# Patient Record
Sex: Female | Born: 1950 | Race: White | Hispanic: No | Marital: Married | Smoking: Former smoker
Health system: Southern US, Community
[De-identification: ages and names within clinical notes are randomized; demographics above are authoritative.]

## PROBLEM LIST (undated history)

## (undated) DIAGNOSIS — F419 Anxiety disorder, unspecified: Secondary | ICD-10-CM

## (undated) DIAGNOSIS — K219 Gastro-esophageal reflux disease without esophagitis: Secondary | ICD-10-CM

## (undated) DIAGNOSIS — F32A Depression, unspecified: Secondary | ICD-10-CM

## (undated) DIAGNOSIS — I10 Essential (primary) hypertension: Secondary | ICD-10-CM

## (undated) DIAGNOSIS — K760 Fatty (change of) liver, not elsewhere classified: Secondary | ICD-10-CM

## (undated) DIAGNOSIS — T7840XA Allergy, unspecified, initial encounter: Secondary | ICD-10-CM

## (undated) DIAGNOSIS — G709 Myoneural disorder, unspecified: Secondary | ICD-10-CM

## (undated) DIAGNOSIS — Z5189 Encounter for other specified aftercare: Secondary | ICD-10-CM

## (undated) DIAGNOSIS — R519 Headache, unspecified: Secondary | ICD-10-CM

## (undated) DIAGNOSIS — M1990 Unspecified osteoarthritis, unspecified site: Secondary | ICD-10-CM

## (undated) DIAGNOSIS — E785 Hyperlipidemia, unspecified: Secondary | ICD-10-CM

## (undated) HISTORY — PX: INCONTINENCE SURGERY: SHX676

## (undated) HISTORY — DX: Allergy, unspecified, initial encounter: T78.40XA

## (undated) HISTORY — PX: LAPAROSCOPIC LYSIS OF ADHESIONS: SHX5905

## (undated) HISTORY — PX: DIAGNOSTIC LAPAROSCOPY: SUR761

## (undated) HISTORY — DX: Encounter for other specified aftercare: Z51.89

## (undated) HISTORY — PX: SPINE SURGERY: SHX786

## (undated) HISTORY — PX: BACK SURGERY: SHX140

## (undated) HISTORY — PX: CHOLECYSTECTOMY: SHX55

## (undated) HISTORY — PX: CARPAL TUNNEL RELEASE: SHX101

## (undated) HISTORY — PX: JOINT REPLACEMENT: SHX530

---

## 1971-04-15 DIAGNOSIS — K759 Inflammatory liver disease, unspecified: Secondary | ICD-10-CM

## 1971-04-15 HISTORY — DX: Inflammatory liver disease, unspecified: K75.9

## 2005-06-25 ENCOUNTER — Ambulatory Visit: Payer: Self-pay | Admitting: Gastroenterology

## 2005-06-26 ENCOUNTER — Ambulatory Visit: Payer: Self-pay | Admitting: Gastroenterology

## 2011-05-25 ENCOUNTER — Encounter (HOSPITAL_COMMUNITY): Payer: Self-pay | Admitting: *Deleted

## 2011-05-25 ENCOUNTER — Emergency Department (HOSPITAL_COMMUNITY): Payer: 59

## 2011-05-25 ENCOUNTER — Emergency Department (HOSPITAL_COMMUNITY)
Admission: EM | Admit: 2011-05-25 | Discharge: 2011-05-25 | Disposition: A | Discharge status: Home / Self Care | Payer: 59 | Attending: Emergency Medicine | Admitting: Emergency Medicine

## 2011-05-25 DIAGNOSIS — S42253A Displaced fracture of greater tuberosity of unspecified humerus, initial encounter for closed fracture: Secondary | ICD-10-CM | POA: Insufficient documentation

## 2011-05-25 DIAGNOSIS — M79609 Pain in unspecified limb: Secondary | ICD-10-CM | POA: Insufficient documentation

## 2011-05-25 DIAGNOSIS — S42209A Unspecified fracture of upper end of unspecified humerus, initial encounter for closed fracture: Secondary | ICD-10-CM

## 2011-05-25 DIAGNOSIS — M25519 Pain in unspecified shoulder: Secondary | ICD-10-CM | POA: Insufficient documentation

## 2011-05-25 DIAGNOSIS — K219 Gastro-esophageal reflux disease without esophagitis: Secondary | ICD-10-CM | POA: Insufficient documentation

## 2011-05-25 DIAGNOSIS — W010XXA Fall on same level from slipping, tripping and stumbling without subsequent striking against object, initial encounter: Secondary | ICD-10-CM | POA: Insufficient documentation

## 2011-05-25 DIAGNOSIS — Z79899 Other long term (current) drug therapy: Secondary | ICD-10-CM | POA: Insufficient documentation

## 2011-05-25 DIAGNOSIS — S42213A Unspecified displaced fracture of surgical neck of unspecified humerus, initial encounter for closed fracture: Secondary | ICD-10-CM | POA: Insufficient documentation

## 2011-05-25 DIAGNOSIS — I10 Essential (primary) hypertension: Secondary | ICD-10-CM | POA: Insufficient documentation

## 2011-05-25 DIAGNOSIS — E785 Hyperlipidemia, unspecified: Secondary | ICD-10-CM | POA: Insufficient documentation

## 2011-05-25 HISTORY — DX: Hyperlipidemia, unspecified: E78.5

## 2011-05-25 HISTORY — DX: Essential (primary) hypertension: I10

## 2011-05-25 HISTORY — DX: Gastro-esophageal reflux disease without esophagitis: K21.9

## 2011-05-25 MED ORDER — HYDROMORPHONE HCL PF 1 MG/ML IJ SOLN
1.0000 mg | Freq: Once | INTRAMUSCULAR | Status: AC
  Administered 2011-05-25: 1 mg via INTRAVENOUS
  Filled 2011-05-25: qty 1

## 2011-05-25 MED ORDER — OXYCODONE-ACETAMINOPHEN 5-325 MG PO TABS: 1.0000 | ORAL_TABLET | Freq: Four times a day (QID) | ORAL | Status: AC | PRN

## 2011-05-25 NOTE — Progress Notes (Signed)
Orthopedic Tech Progress Note Patient Details:  Michele Hanson 03/03/1951 [id number]  Other Ortho Devices Ortho Device Location: immobililzer sling Ortho Device Interventions: Application   Cammer, Mickie Bail 05/25/2011, 2:40 PM

## 2011-05-25 NOTE — ED Notes (Signed)
Patient tripped over dog today, fell hitting corner of wall, patient c/o upper right arm pain, +PMS in right upper extremity

## 2011-05-25 NOTE — ED Provider Notes (Signed)
History     CSN: [id number]  Arrival date & time 05/25/11  1308   First MD Initiated Contact with Patient 05/25/11 1309      Chief Complaint  Patient presents with  . Fall  . Arm Pain    right upper arm pain, fell against corner of wall    (Consider location/radiation/quality/duration/timing/severity/associated sxs/prior treatment) Patient is a 61 y.o. female presenting with fall and arm pain. The history is provided by the patient.  Fall Pertinent negatives include no numbness, no abdominal pain, no nausea, no vomiting and no headaches.  Arm Pain Pertinent negatives include no chest pain, no abdominal pain, no headaches and no shortness of breath.   patient states she tripped over her dog and fell and hit her right shoulder on the corner of the wall. She's severe pain in the right upper shoulder. She states she also hit her head but does not have a headache. No neck pain. No loss of consciousness. No numbness or weakness. The pain does limit the movement of her right arm.  Past Medical History  Diagnosis Date  . Hyperlipemia   . Hypertension   . GERD (gastroesophageal reflux disease)     Past Surgical History  Procedure Date  . Cholecystectomy     No family history on file.  History  Substance Use Topics  . Smoking status: Never Smoker   . Smokeless tobacco: Not on file  . Alcohol Use: Yes     none today    OB History    Grav Para Term Preterm Abortions TAB SAB Ect Mult Living                  Review of Systems  Constitutional: Negative for activity change and appetite change.  HENT: Negative for neck stiffness.   Eyes: Negative for pain.  Respiratory: Negative for chest tightness and shortness of breath.   Cardiovascular: Negative for chest pain and leg swelling.  Gastrointestinal: Negative for nausea, vomiting, abdominal pain and diarrhea.  Genitourinary: Negative for flank pain.  Musculoskeletal: Negative for back pain.       Right shoulder pain    Skin: Negative for rash.  Neurological: Negative for weakness, numbness and headaches.  Psychiatric/Behavioral: Negative for behavioral problems.    Allergies  Review of patient's allergies indicates no known allergies.  Home Medications   Current Outpatient Rx  Name Route Sig Dispense Refill  . AMLODIPINE BESYLATE 5 MG PO TABS Oral Take 5 mg by mouth daily.    Marland Kitchen CITALOPRAM HYDROBROMIDE 20 MG PO TABS Oral Take 20 mg by mouth daily.    Marland Kitchen LISINOPRIL-HYDROCHLOROTHIAZIDE 20-12.5 MG PO TABS Oral Take 1 tablet by mouth daily.    Marland Kitchen OMEPRAZOLE 20 MG PO CPDR Oral Take 20 mg by mouth daily.    Marland Kitchen SIMVASTATIN 20 MG PO TABS Oral Take 20 mg by mouth every evening.    . OXYCODONE-ACETAMINOPHEN 5-325 MG PO TABS Oral Take 1-2 tablets by mouth every 6 (six) hours as needed for pain. 20 tablet 0    BP 129/76  Pulse 95  Temp(Src) 98.4 F (36.9 C) (Oral)  Resp 18  SpO2 99%  Physical Exam  Nursing note and vitals reviewed. Constitutional: She is oriented to person, place, and time. She appears well-developed and well-nourished.  HENT:  Head: Normocephalic and atraumatic.  Eyes: EOM are normal. Pupils are equal, round, and reactive to light.  Neck: Normal range of motion. Neck supple.  Cardiovascular: Normal rate, regular rhythm and normal  heart sounds.   No murmur heard. Pulmonary/Chest: Effort normal and breath sounds normal. No respiratory distress. She has no wheezes. She has no rales.  Abdominal: Soft. Bowel sounds are normal. She exhibits no distension. There is no tenderness. There is no rebound and no guarding.  Musculoskeletal:       Cervical range of motion intact. No hematoma the head. Right shoulder has fullness in the deltoid area. Severe tenderness. Neurovascularly intact distally. Good radial pulse. Sensation intact over radial median and ulnar distribution. Skin is intact  Neurological: She is alert and oriented to person, place, and time. No cranial nerve deficit.  Skin: Skin is  warm and dry.  Psychiatric: She has a normal mood and affect. Her speech is normal.    ED Course  Procedures (including critical care time)  Labs Reviewed - No data to display Dg Shoulder Right  05/25/2011  *RADIOLOGY REPORT*  Clinical Data: Pain post fall  RIGHT SHOULDER - 2+ VIEW  Comparison: None.  Findings: Three views of the right shoulder submitted.  There is mild displaced fracture right proximal humerus involving surgical neck and greater tuberosity.  IMPRESSION: Mild displaced fracture proximal right humerus.  Original Report Authenticated By: Natasha Mead, M.D.     1. Proximal humeral fracture       MDM  Proximal humerus fracture. Closed. Patient wants to followup either with [facility], or with an orthopedic surgeon down in [place]. She was put in a shoulder immobilizer she is given pain medicines and was given a copy of her x-rays. She'll followup as needed        Juliet Rude. Rubin Payor, MD 05/25/11 1459

## 2012-03-16 ENCOUNTER — Telehealth: Payer: Self-pay | Admitting: Internal Medicine

## 2012-03-16 NOTE — Telephone Encounter (Signed)
S/W PT IN REF TO NP APPT. ON 03/23/12 @1 :30 REFERRING DR LEWIT DX-MONOCLONAL PROTEIN ELEVATION MAILED NP PACKET

## 2012-03-16 NOTE — Telephone Encounter (Signed)
C/D 03/16/12 for appt.03/23/12

## 2012-03-23 ENCOUNTER — Encounter: Payer: Self-pay | Admitting: Internal Medicine

## 2012-03-23 ENCOUNTER — Ambulatory Visit (HOSPITAL_BASED_OUTPATIENT_CLINIC_OR_DEPARTMENT_OTHER): Payer: 59 | Admitting: Internal Medicine

## 2012-03-23 ENCOUNTER — Ambulatory Visit: Payer: 59

## 2012-03-23 ENCOUNTER — Other Ambulatory Visit (HOSPITAL_BASED_OUTPATIENT_CLINIC_OR_DEPARTMENT_OTHER): Payer: 59 | Admitting: Lab

## 2012-03-23 ENCOUNTER — Telehealth: Payer: Self-pay | Admitting: Internal Medicine

## 2012-03-23 VITALS — BP 114/78 | HR 100 | Temp 97.8°F | Resp 20 | Ht 66.0 in | Wt 161.7 lb

## 2012-03-23 DIAGNOSIS — D472 Monoclonal gammopathy: Secondary | ICD-10-CM

## 2012-03-23 DIAGNOSIS — R52 Pain, unspecified: Secondary | ICD-10-CM

## 2012-03-23 LAB — COMPREHENSIVE METABOLIC PANEL (CC13)
ALT: 21 U/L (ref 0–55)
AST: 20 U/L (ref 5–34)
Alkaline Phosphatase: 94 U/L (ref 40–150)
Glucose: 113 mg/dl — ABNORMAL HIGH (ref 70–99)
Sodium: 140 mEq/L (ref 136–145)
Total Bilirubin: 0.59 mg/dL (ref 0.20–1.20)
Total Protein: 7 g/dL (ref 6.4–8.3)

## 2012-03-23 LAB — CBC WITH DIFFERENTIAL/PLATELET
BASO%: 0.3 % (ref 0.0–2.0)
EOS%: 1.4 % (ref 0.0–7.0)
LYMPH%: 27.3 % (ref 14.0–49.7)
MCH: 31.8 pg (ref 25.1–34.0)
MCHC: 34 g/dL (ref 31.5–36.0)
MCV: 93.5 fL (ref 79.5–101.0)
MONO%: 6.8 % (ref 0.0–14.0)
Platelets: 181 10*3/uL (ref 145–400)
RBC: 4.37 10*6/uL (ref 3.70–5.45)
RDW: 12.3 % (ref 11.2–14.5)

## 2012-03-23 NOTE — Telephone Encounter (Signed)
appts made and printed for pt aom °

## 2012-03-23 NOTE — Progress Notes (Signed)
Checked in new pt with no financial concerns. °

## 2012-03-23 NOTE — Patient Instructions (Signed)
You have very mild M spike on the previous blood, questionable for MGUS or reactive. I ordered myeloma panel today. I would see her back for followup visit in 2 weeks for evaluation and discussion of the pending lab results.

## 2012-03-23 NOTE — Progress Notes (Signed)
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  HPI Michele [facility]on is a 61 y.o. 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 The patient was seen recently by Dr. Clarisse [facility] complaining of generalized pain especially in the lower back and lower extremity as well as tingling in her feet. He ordered several blood work including serum protein electrophoreses which which was performed on 02/01/2012 and showed elevated M spike of 0.2 g/dL. Quantitative immunoglobulin showed low IgG of 639, normal IgA of 176 and normal IgM of 114. The immunofixation showed the IgG monoclonal protein with lambda light chain specificity. The patient was referred to me today for evaluation and recommendation regarding these abnormalities. She continues to complain of tingling in her feet as well as aching pain all over her body especially in the lower back. The patient denied having any significant weight loss or night sweats. She has no palpable lymphadenopathy, no bleeding, bruises or ecchymosis. She has no significant chest pain, shortness breath, cough or hemoptysis. Family history significant for a mother who had ovarian cancer and a father with prostate cancer. The patient is married and has no children. She works as a Conservator, museum/gallery for [employer]. She has no history of smoking but drinks alcohol occasionally and no history of drug abuse. @SFHPI @  Past Medical History  Diagnosis Date  . Hyperlipemia   . Hypertension   . GERD (gastroesophageal reflux disease)     Past Surgical History  Procedure Date  . Cholecystectomy     No family history on file.  Social History History  Substance Use Topics  . Smoking status: Never Smoker   . Smokeless tobacco: Not on file  . Alcohol Use:  Yes     Comment: none today    No Known Allergies  Current Outpatient Prescriptions  Medication Sig Dispense Refill  . amLODipine (NORVASC) 5 MG tablet Take 5 mg by mouth daily.      . citalopram (CELEXA) 20 MG tablet Take 20 mg by mouth daily.      . fluticasone (FLONASE) 50 MCG/ACT nasal spray Place 1 spray into the nose Ad lib.      Marland Kitchen lisinopril-hydrochlorothiazide (PRINZIDE,ZESTORETIC) 20-12.5 MG per tablet Take 1 tablet by mouth daily.      Marland Kitchen omeprazole (PRILOSEC) 20 MG capsule Take 20 mg by mouth daily.      . baclofen (LIORESAL) 10 MG tablet Take 10 mg by mouth Daily.      . pravastatin (PRAVACHOL) 40 MG tablet Take 40 mg by mouth Daily.      . Vitamin D, Ergocalciferol, (DRISDOL) 50000 UNITS CAPS Take 1 capsule by mouth Daily.        Review of Systems  A comprehensive review of systems was negative except for: Constitutional: positive for Generalized aching pain Musculoskeletal: positive for arthralgias  Physical Exam  AVW:UJWJX, healthy, no distress, well nourished and well developed SKIN: skin color, texture, turgor are normal, no rashes or significant lesions HEAD: Normocephalic, No masses, lesions, tenderness or abnormalities EYES: normal, PERRLA EARS: External ears normal OROPHARYNX:no exudate and no erythema  NECK: supple, no adenopathy LYMPH:  no palpable lymphadenopathy, no hepatosplenomegaly BREAST:not examined LUNGS: clear to auscultation  HEART: regular rate & rhythm and no murmurs ABDOMEN:abdomen soft, non-tender, normal bowel sounds and no masses or organomegaly BACK: Back symmetric, no  curvature. EXTREMITIES:no joint deformities, effusion, or inflammation, no edema, no skin discoloration, no clubbing  NEURO: alert & oriented x 3 with fluent speech, no focal motor/sensory deficits  PERFORMANCE STATUS: ECOG 0  LABORATORY DATA: Lab Results  Component Value Date   WBC 7.6 03/23/2012   HGB 13.9 03/23/2012   HCT 40.8 03/23/2012   MCV 93.5 03/23/2012    PLT 181 03/23/2012      Chemistry   No results found for this basename: NA, K, CL, CO2, BUN, CREATININE, GLU   No results found for this basename: CALCIUM, ALKPHOS, AST, ALT, BILITOT       RADIOGRAPHIC STUDIES: No results found.  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  PLAN: I have a lengthy discussion with the patient today about her condition. I ordered several studies today including myeloma panel. I recommend for the patient to continue on observation for now. I would see her back for followup visit in 2 weeks for evaluation and discussion of her myeloma panel. In the myeloma panel is suspicious for multiple myeloma I would consider the patient for a skeletal bone survey as well as a bone marrow biopsy and aspirate. The patient agreed to the current plan. She was advised to call immediately if she has any concerning symptoms in the interval.  All questions were answered. The patient knows to call the clinic with any problems, questions or concerns. We can certainly see the patient much sooner if necessary.  Thank you so much for allowing me to participate in the care of Michele Hanson. I will continue to follow up the patient with you and assist in her care.  I spent 25 minutes counseling the patient face to face. The total time spent in the appointment was 50 minutes.   Michele Hanson K. 03/23/2012, 2:37 PM

## 2012-03-24 LAB — KAPPA/LAMBDA LIGHT CHAINS: Kappa:Lambda Ratio: 0.44 (ref 0.26–1.65)

## 2012-03-24 LAB — BETA 2 MICROGLOBULIN, SERUM: Beta-2 Microglobulin: 1.88 mg/L — ABNORMAL HIGH (ref 1.01–1.73)

## 2012-03-24 LAB — IGG, IGA, IGM
IgA: 198 mg/dL (ref 69–380)
IgG (Immunoglobin G), Serum: 698 mg/dL (ref 690–1700)

## 2012-04-06 ENCOUNTER — Encounter: Payer: Self-pay | Admitting: Internal Medicine

## 2012-04-06 ENCOUNTER — Ambulatory Visit (HOSPITAL_BASED_OUTPATIENT_CLINIC_OR_DEPARTMENT_OTHER): Payer: 59 | Admitting: Internal Medicine

## 2012-04-06 VITALS — BP 116/81 | HR 83 | Temp 97.2°F | Resp 18 | Ht 66.0 in | Wt 161.9 lb

## 2012-04-06 DIAGNOSIS — D472 Monoclonal gammopathy: Secondary | ICD-10-CM

## 2012-04-06 NOTE — Patient Instructions (Signed)
No significant abnormality on the recent blood work. Followup with your primary care physician as previously scheduled.

## 2012-04-06 NOTE — Progress Notes (Signed)
[facility] [phone]   Fax:([phone]  OFFICE PROGRESS NOTE  Desmond Dike, MD [address] 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 female returns to the clinic today for followup visit. The patient is feeling fine today with no specific complaints. She was seen recently for evaluation of questionable monoclonal gammopathy. I ordered several studies on this patient including repeat CBC, comprehensive metabolic panel, LDH and myeloma panel. She is here today for evaluation and discussion of her lab results. Her CBC and comprehensive metabolic panel as well as LDH were unremarkable. The myeloma panel showed slightly elevated beta-2 microglobulin of 1.88. Free kappa light chain was normal at 0.74, free lambda light chain was normal at 1.70, and kappa/lambda ratio was normal at 0.44. Quantitative immunoglobulin showed normal IgG of 698, normal IgA of 198 and normal IgM of 104.  MEDICAL HISTORY: Past Medical History  Diagnosis Date  . Hyperlipemia   . Hypertension   . GERD (gastroesophageal reflux disease)     ALLERGIES:   has no known allergies.  MEDICATIONS:  Current Outpatient Prescriptions  Medication Sig Dispense Refill  . amLODipine (NORVASC) 5 MG tablet Take 5 mg by mouth daily.      . baclofen (LIORESAL) 10 MG tablet Take 10 mg by mouth Daily.      . citalopram (CELEXA) 20 MG tablet Take 20 mg by mouth daily.      . fluticasone (FLONASE) 50 MCG/ACT nasal spray Place 1 spray into the nose Ad lib.      Marland Kitchen lisinopril-hydrochlorothiazide (PRINZIDE,ZESTORETIC) 20-12.5 MG per tablet Take 1 tablet by mouth daily.      Marland Kitchen omeprazole (PRILOSEC) 20 MG capsule Take 20 mg by mouth daily.      . Vitamin D, Ergocalciferol, (DRISDOL) 50000 UNITS CAPS Take 1 capsule by mouth Daily.      . pravastatin (PRAVACHOL) 40 MG tablet Take  40 mg by mouth Daily.        SURGICAL HISTORY:  Past Surgical History  Procedure Date  . Cholecystectomy     REVIEW OF SYSTEMS:  A comprehensive review of systems was negative.   PHYSICAL EXAMINATION: General appearance: alert, cooperative and no distress Head: Normocephalic, without obvious abnormality, atraumatic Neck: no adenopathy Resp: clear to auscultation bilaterally Cardio: regular rate and rhythm, S1, S2 normal, no murmur, click, rub or gallop GI: soft, non-tender; bowel sounds normal; no masses,  no organomegaly Extremities: extremities normal, atraumatic, no cyanosis or edema  ECOG PERFORMANCE STATUS: 0 - Asymptomatic  Blood pressure 116/81, pulse 83, temperature 97.2 F (36.2 C), temperature source Oral, resp. rate 18, height 5\' 6"  (1.676 m), weight 161 lb 14.4 oz (73.437 kg).  LABORATORY DATA: Lab Results  Component Value Date   WBC 7.6 03/23/2012   HGB 13.9 03/23/2012   HCT 40.8 03/23/2012   MCV 93.5 03/23/2012   PLT 181 03/23/2012      Chemistry      Component Value Date/Time   NA 140 03/23/2012 1341   K 3.9 03/23/2012 1341   CL 103 03/23/2012 1341   CO2 24 03/23/2012 1341   BUN 14.0 03/23/2012 1341   CREATININE 0.9 03/23/2012 1341      Component Value Date/Time   CALCIUM 9.3 03/23/2012 1341   ALKPHOS 94 03/23/2012 1341   AST 20 03/23/2012 1341   ALT 21 03/23/2012 1341   BILITOT 0.59 03/23/2012  1341       RADIOGRAPHIC STUDIES: No results found.  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 The patient has no significant finding on the recent blood work to suggest monoclonal gammopathy or multiple myeloma.  PLAN: I discussed the lab result with the patient and give her a copy of her report. I recommended for her to continue on observation for now with routine followup visit with her primary care physician. I don't see a need to do any further evaluation at  this point but will be happy to see her in the future if needed especially if she has any significant anemia, thrombocytopenia, hypercalcemia or renal insufficiency. The patient agreed to the current plan.   All questions were answered. The patient knows to call the clinic with any problems, questions or concerns. We can certainly see the patient much sooner if necessary.

## 2013-07-20 ENCOUNTER — Telehealth: Payer: Self-pay | Admitting: Internal Medicine

## 2013-07-20 NOTE — Telephone Encounter (Signed)
returned pt call adn advised on appt...pt ok and aware

## 2013-08-05 ENCOUNTER — Other Ambulatory Visit: Payer: Self-pay | Admitting: *Deleted

## 2013-08-08 ENCOUNTER — Telehealth: Payer: Self-pay | Admitting: Internal Medicine

## 2013-08-08 ENCOUNTER — Encounter: Payer: Self-pay | Admitting: Internal Medicine

## 2013-08-08 ENCOUNTER — Ambulatory Visit (HOSPITAL_BASED_OUTPATIENT_CLINIC_OR_DEPARTMENT_OTHER): Payer: 59 | Admitting: Internal Medicine

## 2013-08-08 VITALS — BP 114/77 | HR 93 | Temp 98.3°F | Resp 18 | Ht 66.0 in | Wt 161.5 lb

## 2013-08-08 DIAGNOSIS — D472 Monoclonal gammopathy: Secondary | ICD-10-CM

## 2013-08-08 DIAGNOSIS — R52 Pain, unspecified: Secondary | ICD-10-CM

## 2013-08-08 NOTE — Telephone Encounter (Signed)
gv adn printed apt sched and avs for pt for April 2015 and 2016...lab closed today pt will comeback tomorrow

## 2013-08-08 NOTE — Progress Notes (Signed)
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 female returns to the clinic today for followup visit. The patient is feeling fine today with no specific complaints. She was seen recently for evaluation of questionable monoclonal gammopathy. She was last seen in December of 2013. She was supposed to have an annual followup visit with repeat myeloma panel but the patient missed her appointment. She was seen recently by her primary care physician and was referred back to me for evaluation of her condition. She has generalized aching pain but denied having any significant fatigue or weakness. She has no significant weight loss or night sweats. The patient denied having any chest pain, shortness breath, cough or hemoptysis. No nausea or vomiting.  MEDICAL HISTORY: Past Medical History  Diagnosis Date  . Hyperlipemia   . Hypertension   . GERD (gastroesophageal reflux disease)     ALLERGIES:  has No Known Allergies.  MEDICATIONS:  Current Outpatient Prescriptions  Medication Sig Dispense Refill  . amLODipine (NORVASC) 5 MG tablet Take 5 mg by mouth daily.      . baclofen (LIORESAL) 10 MG tablet Take 10 mg by mouth Daily.      . citalopram (CELEXA) 20 MG tablet Take 20 mg by mouth daily.      . fluticasone (FLONASE) 50 MCG/ACT nasal spray Place 1 spray into the nose Ad lib.      Marland Kitchen lisinopril-hydrochlorothiazide (PRINZIDE,ZESTORETIC) 20-12.5 MG per tablet Take 1 tablet by mouth daily.      Marland Kitchen omeprazole (PRILOSEC) 20 MG capsule Take 20 mg by mouth daily.      . pravastatin (PRAVACHOL) 40 MG tablet Take 40 mg by mouth Daily.      . Vitamin D, Ergocalciferol, (DRISDOL) 50000 UNITS CAPS Take 1 capsule by mouth Daily.       No current  facility-administered medications for this visit.    SURGICAL HISTORY:  Past Surgical History  Procedure Laterality Date  . Cholecystectomy      REVIEW OF SYSTEMS:  A comprehensive review of systems was negative except for: Musculoskeletal: positive for arthralgias   PHYSICAL EXAMINATION: General appearance: alert, cooperative and no distress Head: Normocephalic, without obvious abnormality, atraumatic Neck: no adenopathy Resp: clear to auscultation bilaterally Cardio: regular rate and rhythm, S1, S2 normal, no murmur, click, rub or gallop GI: soft, non-tender; bowel sounds normal; no masses,  no organomegaly Extremities: extremities normal, atraumatic, no cyanosis or edema  ECOG PERFORMANCE STATUS: 0 - Asymptomatic  Blood pressure 114/77, pulse 93, temperature 98.3 F (36.8 C), temperature source Oral, resp. rate 18, height 5\' 6"  (1.676 m), weight 161 lb 8 oz (73.256 kg), SpO2 97.00%.  LABORATORY DATA: Lab Results  Component Value Date   WBC 7.6 03/23/2012   HGB 13.9 03/23/2012   HCT 40.8 03/23/2012   MCV 93.5 03/23/2012   PLT 181 03/23/2012      Chemistry      Component Value Date/Time   NA 140 03/23/2012 1341   K 3.9 03/23/2012 1341   CL 103 03/23/2012 1341   CO2 24 03/23/2012 1341   BUN 14.0 03/23/2012 1341   CREATININE 0.9 03/23/2012 1341      Component Value Date/Time   CALCIUM 9.3 03/23/2012 1341   ALKPHOS 94 03/23/2012 1341   AST 20  03/23/2012 1341   ALT 21 03/23/2012 1341   BILITOT 0.59 03/23/2012 1341       RADIOGRAPHIC STUDIES: No results found.  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  The patient has been observation for the last 16 months. She does not have any lab work since December of 2013. I recommended for her to have a myeloma panel performed today. If there is no evidence for disease progression, I would see the patient back for followup  visit in one year with repeat myeloma panel. She was advised to call immediately if she has any concerning symptoms in the interval please  All questions were answered. The patient knows to call the clinic with any problems, questions or concerns. We can certainly see the patient much sooner if necessary.  Disclaimer: This note was dictated with voice recognition software. Similar sounding words can inadvertently be transcribed and may not be corrected upon review.

## 2013-08-09 ENCOUNTER — Other Ambulatory Visit (HOSPITAL_BASED_OUTPATIENT_CLINIC_OR_DEPARTMENT_OTHER): Payer: 59

## 2013-08-09 DIAGNOSIS — D472 Monoclonal gammopathy: Secondary | ICD-10-CM

## 2013-08-09 LAB — COMPREHENSIVE METABOLIC PANEL (CC13)
ALBUMIN: 4.2 g/dL (ref 3.5–5.0)
ALT: 12 U/L (ref 0–55)
ANION GAP: 11 meq/L (ref 3–11)
AST: 18 U/L (ref 5–34)
Alkaline Phosphatase: 78 U/L (ref 40–150)
BUN: 17.5 mg/dL (ref 7.0–26.0)
CALCIUM: 9.4 mg/dL (ref 8.4–10.4)
CHLORIDE: 104 meq/L (ref 98–109)
CO2: 22 mEq/L (ref 22–29)
CREATININE: 0.8 mg/dL (ref 0.6–1.1)
GLUCOSE: 82 mg/dL (ref 70–140)
POTASSIUM: 4.4 meq/L (ref 3.5–5.1)
Sodium: 137 mEq/L (ref 136–145)
Total Bilirubin: 0.4 mg/dL (ref 0.20–1.20)
Total Protein: 7.3 g/dL (ref 6.4–8.3)

## 2013-08-09 LAB — CBC WITH DIFFERENTIAL/PLATELET
BASO%: 0.3 % (ref 0.0–2.0)
BASOS ABS: 0 10*3/uL (ref 0.0–0.1)
EOS ABS: 0.1 10*3/uL (ref 0.0–0.5)
EOS%: 1.4 % (ref 0.0–7.0)
HEMATOCRIT: 40 % (ref 34.8–46.6)
HEMOGLOBIN: 13.2 g/dL (ref 11.6–15.9)
LYMPH#: 2.1 10*3/uL (ref 0.9–3.3)
LYMPH%: 28 % (ref 14.0–49.7)
MCH: 30.5 pg (ref 25.1–34.0)
MCHC: 33 g/dL (ref 31.5–36.0)
MCV: 92.4 fL (ref 79.5–101.0)
MONO#: 0.5 10*3/uL (ref 0.1–0.9)
MONO%: 6.3 % (ref 0.0–14.0)
NEUT%: 64 % (ref 38.4–76.8)
NEUTROS ABS: 4.8 10*3/uL (ref 1.5–6.5)
Platelets: 188 10*3/uL (ref 145–400)
RBC: 4.33 10*6/uL (ref 3.70–5.45)
RDW: 12.2 % (ref 11.2–14.5)
WBC: 7.4 10*3/uL (ref 3.9–10.3)

## 2013-08-09 LAB — LACTATE DEHYDROGENASE (CC13): LDH: 131 U/L (ref 125–245)

## 2013-08-11 LAB — KAPPA/LAMBDA LIGHT CHAINS
KAPPA FREE LGHT CHN: 0.22 mg/dL — AB (ref 0.33–1.94)
Kappa:Lambda Ratio: 0.1 — ABNORMAL LOW (ref 0.26–1.65)
Lambda Free Lght Chn: 2.2 mg/dL (ref 0.57–2.63)

## 2013-08-11 LAB — IGG, IGA, IGM
IGG (IMMUNOGLOBIN G), SERUM: 315 mg/dL — AB (ref 690–1700)
IgA: 96 mg/dL (ref 69–380)
IgM, Serum: 55 mg/dL (ref 52–322)

## 2013-08-11 LAB — BETA 2 MICROGLOBULIN, SERUM: BETA 2 MICROGLOBULIN: 2.07 mg/L (ref <=2.51)

## 2013-12-09 ENCOUNTER — Telehealth: Payer: Self-pay | Admitting: Medical Oncology

## 2013-12-09 NOTE — Telephone Encounter (Addendum)
Pt notified. Message copied by Ardeen Garland on Fri Dec 09, 2013 12:17 PM ------      Message from: Curt Bears      Created: Thu Dec 08, 2013  5:05 PM       Lab was good      ----- Message -----         From: Ardeen Garland, RN         Sent: 12/08/2013  12:38 PM           To: Curt Bears, MD            wants results from protein studies done in march . Never got a call back       ------

## 2014-08-10 ENCOUNTER — Other Ambulatory Visit: Payer: 59

## 2014-08-10 ENCOUNTER — Ambulatory Visit: Payer: 59 | Admitting: Internal Medicine

## 2015-05-24 ENCOUNTER — Encounter: Payer: Self-pay | Admitting: Gastroenterology

## 2016-04-21 ENCOUNTER — Other Ambulatory Visit: Payer: Self-pay | Admitting: Internal Medicine

## 2016-04-21 ENCOUNTER — Telehealth: Payer: Self-pay | Admitting: Medical Oncology

## 2016-04-21 DIAGNOSIS — D472 Monoclonal gammopathy: Secondary | ICD-10-CM

## 2016-04-21 NOTE — Telephone Encounter (Signed)
Done

## 2016-04-21 NOTE — Telephone Encounter (Signed)
requsts referral to [facility]. Last visit 2015.

## 2016-06-13 DIAGNOSIS — D472 Monoclonal gammopathy: Secondary | ICD-10-CM | POA: Diagnosis not present

## 2016-06-27 DIAGNOSIS — D472 Monoclonal gammopathy: Secondary | ICD-10-CM | POA: Diagnosis not present

## 2017-06-29 DIAGNOSIS — D472 Monoclonal gammopathy: Secondary | ICD-10-CM | POA: Diagnosis not present

## 2017-07-15 ENCOUNTER — Ambulatory Visit (INDEPENDENT_AMBULATORY_CARE_PROVIDER_SITE_OTHER): Payer: Medicare Other | Admitting: Family Medicine

## 2017-07-15 ENCOUNTER — Encounter: Payer: Self-pay | Admitting: Family Medicine

## 2017-07-15 VITALS — BP 110/80 | HR 73 | Ht 66.0 in | Wt 162.4 lb

## 2017-07-15 DIAGNOSIS — E7800 Pure hypercholesterolemia, unspecified: Secondary | ICD-10-CM | POA: Insufficient documentation

## 2017-07-15 DIAGNOSIS — E538 Deficiency of other specified B group vitamins: Secondary | ICD-10-CM

## 2017-07-15 DIAGNOSIS — I10 Essential (primary) hypertension: Secondary | ICD-10-CM

## 2017-07-15 DIAGNOSIS — M48062 Spinal stenosis, lumbar region with neurogenic claudication: Secondary | ICD-10-CM | POA: Diagnosis not present

## 2017-07-15 DIAGNOSIS — R7989 Other specified abnormal findings of blood chemistry: Secondary | ICD-10-CM

## 2017-07-15 DIAGNOSIS — Z0000 Encounter for general adult medical examination without abnormal findings: Secondary | ICD-10-CM | POA: Diagnosis not present

## 2017-07-15 DIAGNOSIS — G629 Polyneuropathy, unspecified: Secondary | ICD-10-CM | POA: Diagnosis not present

## 2017-07-15 LAB — LIPID PANEL
CHOL/HDL RATIO: 2
CHOLESTEROL: 210 mg/dL — AB (ref 0–200)
HDL: 89.9 mg/dL (ref >39.00)
LDL Cholesterol: 107 mg/dL — ABNORMAL HIGH (ref 0–99)
NonHDL: 119.97
TRIGLYCERIDES: 67 mg/dL (ref 0.0–149.0)
VLDL: 13.4 mg/dL (ref 0.0–40.0)

## 2017-07-15 LAB — COMPREHENSIVE METABOLIC PANEL
ALBUMIN: 4.2 g/dL (ref 3.5–5.2)
ALK PHOS: 51 U/L (ref 39–117)
ALT: 13 U/L (ref 0–35)
AST: 16 U/L (ref 0–37)
BILIRUBIN TOTAL: 0.6 mg/dL (ref 0.2–1.2)
BUN: 21 mg/dL (ref 6–23)
CALCIUM: 9.5 mg/dL (ref 8.4–10.5)
CO2: 29 meq/L (ref 19–32)
CREATININE: 0.72 mg/dL (ref 0.40–1.20)
Chloride: 100 mEq/L (ref 96–112)
GFR: 86.02 mL/min (ref >60.00)
Glucose, Bld: 95 mg/dL (ref 70–99)
Potassium: 4.6 mEq/L (ref 3.5–5.1)
Sodium: 139 mEq/L (ref 135–145)
TOTAL PROTEIN: 6.7 g/dL (ref 6.0–8.3)

## 2017-07-15 LAB — B12 AND FOLATE PANEL: VITAMIN B 12: 205 pg/mL — AB (ref 211–911)

## 2017-07-15 LAB — TSH: TSH: 1.37 u[IU]/mL (ref 0.35–4.50)

## 2017-07-15 MED ORDER — METHOCARBAMOL 500 MG PO TABS: 500.0000 mg | ORAL_TABLET | Freq: Three times a day (TID) | ORAL | 0 refills | Status: DC | PRN

## 2017-07-15 NOTE — Progress Notes (Addendum)
Subjective:  Patient ID: Michele Hanson, female    DOB: [birth year]  Age: 67 y.o. MRN: [id number]  CC: Establish Care   HPI Michele Hanson presents for establishment of care and follow-up of multiple medical issues.  Her blood pressures have been well controlled and run on the low side and she is wondering if she would be able to stop 1 of her medicines.  She tells me that her cholesterol has been well controlled as well.  She has been taking her pravastatin perhaps once a week.  She has never had a heart attack or stroke and has no vascular disease that she knows of.  She is wondering if she could do without the cholesterol medicine.  A review of her lipid panel shows HDL cholesterols as high as 125 and her LDL cholesterols as high as 140.  She has a history of spinal stenosis and occasionally experiences morning stiffness.  She requests some medicine for the stiffness.  She tells me that she developed a tingling and burning in her feet after taking Cipro for urinary tract infection.  She is retired from Science writer work.  Her husband passed 3 years ago.  She has met somebody and they have bought a house together here in [place].  She is moving from [place].  She has 2 children.  She has 4 other siblings.  She stays active walking her dogs.  She has been busy handling the business affairs of her family.  She sees a GYN doctor for her Pap smears and mammograms.  She is status post multiple colonoscopies.  Her last one was clear.  She is seeing hematology for a indeterminate gammopathy.  Her CBCs have been normal to date.  She has 1 or 2 alcoholic drinks daily.  Past medical history of depression that is been well controlled with Celexa.  History Michele Hanson has a past medical history of GERD (gastroesophageal reflux disease), Hyperlipemia, and Hypertension.   She has a past surgical history that includes Cholecystectomy.   Her Family history is unknown by patient.She reports that she has never smoked.  She has never used smokeless tobacco. She reports that she drinks alcohol. Her drug history is not on file.  Outpatient Medications Prior to Visit  Medication Sig Dispense Refill  . Azelastine HCl 137 MCG/SPRAY SOLN Place 1 spray into both nostrils as needed.    . citalopram (CELEXA) 20 MG tablet Take 20 mg by mouth daily.    . fluticasone (FLONASE) 50 MCG/ACT nasal spray Place 1 spray into the nose Ad lib.    Marland Kitchen gabapentin (NEURONTIN) 300 MG capsule Take 1 capsule by mouth 2 (two) times daily.    Marland Kitchen HYDROcodone-acetaminophen (NORCO/VICODIN) 5-325 MG tablet As needed    . lisinopril-hydrochlorothiazide (PRINZIDE,ZESTORETIC) 20-12.5 MG per tablet Take 1 tablet by mouth daily.    Marland Kitchen omeprazole (PRILOSEC) 20 MG capsule Take 20 mg by mouth daily.    Marland Kitchen amLODipine (NORVASC) 5 MG tablet Take 5 mg by mouth daily.    . baclofen (LIORESAL) 10 MG tablet Take 10 mg by mouth Daily.    . pravastatin (PRAVACHOL) 40 MG tablet Take 40 mg by mouth Daily.    . Vitamin D, Ergocalciferol, (DRISDOL) 50000 UNITS CAPS Take 1 capsule by mouth every 14 (fourteen) days.      No facility-administered medications prior to visit.     ROS Review of Systems  Constitutional: Negative.   HENT: Negative.   Eyes: Negative.   Respiratory: Negative.   Cardiovascular: Negative.  Gastrointestinal: Negative.   Endocrine: Negative for polyphagia and polyuria.  Genitourinary: Negative for difficulty urinating and hematuria.  Musculoskeletal: Positive for back pain. Negative for gait problem.  Allergic/Immunologic: Negative for immunocompromised state.  Neurological: Positive for numbness. Negative for weakness.  Hematological: Does not bruise/bleed easily.  Psychiatric/Behavioral: Negative.     Objective:  BP 110/80 (BP Location: Left Arm, Patient Position: Sitting, Cuff Size: Normal)   Pulse 73   Ht '5\' 6"'$  (1.676 m)   Wt 162 lb 6 oz (73.7 kg)   SpO2 97%   BMI 26.21 kg/m   Physical Exam  Constitutional: She is  oriented to person, place, and time. She appears well-developed and well-nourished. No distress.  HENT:  Head: Normocephalic and atraumatic.  Right Ear: External ear normal.  Left Ear: External ear normal.  Mouth/Throat: Oropharynx is clear and moist. No oropharyngeal exudate.  Eyes: Pupils are equal, round, and reactive to light. Right eye exhibits no discharge. Left eye exhibits no discharge. No scleral icterus.  Neck: Neck supple. No JVD present. No tracheal deviation present. No thyromegaly present.  Cardiovascular: Normal rate, regular rhythm and normal heart sounds.  Pulmonary/Chest: Effort normal and breath sounds normal. No stridor.  Abdominal: Bowel sounds are normal.  Lymphadenopathy:    She has no cervical adenopathy.  Neurological: She is alert and oriented to person, place, and time.  Skin: Skin is warm and dry. She is not diaphoretic.  Psychiatric: She has a normal mood and affect. Her behavior is normal.      Assessment & Plan:   Michele Hanson was seen today for establish care.  Diagnoses and all orders for this visit:  Essential hypertension -     Comprehensive metabolic panel  Healthcare maintenance  Elevated cholesterol -     Lipid panel -     Comprehensive metabolic panel  Spinal stenosis of lumbar region with neurogenic claudication -     methocarbamol (ROBAXIN) 500 MG tablet; Take 1 tablet (500 mg total) by mouth every 8 (eight) hours as needed for muscle spasms.  Neuropathy -     B12 and Folate Panel -     cyanocobalamin 500 MCG tablet; Take 1 tablet (500 mcg total) by mouth daily.  Abnormal CBC -     B12 and Folate Panel -     TSH  B12 deficiency -     cyanocobalamin 500 MCG tablet; Take 1 tablet (500 mcg total) by mouth daily.   I have discontinued Michele Hanson's amLODipine, baclofen, pravastatin, and Vitamin D (Ergocalciferol). I am also having her start on methocarbamol and cyanocobalamin. Additionally, I am having her maintain her omeprazole,  lisinopril-hydrochlorothiazide, citalopram, fluticasone, HYDROcodone-acetaminophen, gabapentin, and Azelastine HCl.  Meds ordered this encounter  Medications  . methocarbamol (ROBAXIN) 500 MG tablet    Sig: Take 1 tablet (500 mg total) by mouth every 8 (eight) hours as needed for muscle spasms.    Dispense:  60 tablet    Refill:  0  . cyanocobalamin 500 MCG tablet    Sig: Take 1 tablet (500 mcg total) by mouth daily.    Dispense:  30 tablet    Refill:  2   She will hold her amlodipine as well as the pravastatin she will follow-up in 2 months for recheck of her blood pressure and cholesterol.  Follow-up: Return in about 2 months (around 09/14/2017).  Libby Maw, MD

## 2017-07-15 NOTE — Patient Instructions (Signed)
DASH Eating Plan DASH stands for "Dietary Approaches to Stop Hypertension." The DASH eating plan is a healthy eating plan that has been shown to reduce high blood pressure (hypertension). It may also reduce your risk for type 2 diabetes, heart disease, and stroke. The DASH eating plan may also help with weight loss. What are tips for following this plan? General guidelines  Avoid eating more than 2,300 mg (milligrams) of salt (sodium) a day. If you have hypertension, you may need to reduce your sodium intake to 1,500 mg a day.  Limit alcohol intake to no more than 1 drink a day for nonpregnant women and 2 drinks a day for men. One drink equals 12 oz of beer, 5 oz of wine, or 1 oz of hard liquor.  Work with your health care provider to maintain a healthy body weight or to lose weight. Ask what an ideal weight is for you.  Get at least 30 minutes of exercise that causes your heart to beat faster (aerobic exercise) most days of the week. Activities may include walking, swimming, or biking.  Work with your health care provider or diet and nutrition specialist (dietitian) to adjust your eating plan to your individual calorie needs. Reading food labels  Check food labels for the amount of sodium per serving. Choose foods with less than 5 percent of the Daily Value of sodium. Generally, foods with less than 300 mg of sodium per serving fit into this eating plan.  To find whole grains, look for the word "whole" as the first word in the ingredient list. Shopping  Buy products labeled as "low-sodium" or "no salt added."  Buy fresh foods. Avoid canned foods and premade or frozen meals. Cooking  Avoid adding salt when cooking. Use salt-free seasonings or herbs instead of table salt or sea salt. Check with your health care provider or pharmacist before using salt substitutes.  Do not fry foods. Cook foods using healthy methods such as baking, boiling, grilling, and broiling instead.  Cook with  heart-healthy oils, such as olive, canola, soybean, or sunflower oil. Meal planning   Eat a balanced diet that includes: ? 5 or more servings of fruits and vegetables each day. At each meal, try to fill half of your plate with fruits and vegetables. ? Up to 6-8 servings of whole grains each day. ? Less than 6 oz of lean meat, poultry, or fish each day. A 3-oz serving of meat is about the same size as a deck of cards. One egg equals 1 oz. ? 2 servings of low-fat dairy each day. ? A serving of nuts, seeds, or beans 5 times each week. ? Heart-healthy fats. Healthy fats called Omega-3 fatty acids are found in foods such as flaxseeds and coldwater fish, like sardines, salmon, and mackerel.  Limit how much you eat of the following: ? Canned or prepackaged foods. ? Food that is high in trans fat, such as fried foods. ? Food that is high in saturated fat, such as fatty meat. ? Sweets, desserts, sugary drinks, and other foods with added sugar. ? Full-fat dairy products.  Do not salt foods before eating.  Try to eat at least 2 vegetarian meals each week.  Eat more home-cooked food and less restaurant, buffet, and fast food.  When eating at a restaurant, ask that your food be prepared with less salt or no salt, if possible. What foods are recommended? The items listed may not be a complete list. Talk with your dietitian about what   dietary choices are best for you. Grains Whole-grain or whole-wheat bread. Whole-grain or whole-wheat pasta. Brown rice. [place]. Bulgur. Whole-grain and low-sodium cereals. Pita bread. Low-fat, low-sodium crackers. Whole-wheat flour tortillas. Vegetables Fresh or frozen vegetables (raw, steamed, roasted, or grilled). Low-sodium or reduced-sodium tomato and vegetable juice. Low-sodium or reduced-sodium tomato sauce and tomato paste. Low-sodium or reduced-sodium canned vegetables. Fruits All fresh, dried, or frozen fruit. Canned fruit in natural juice (without  added sugar). Meat and other protein foods Skinless chicken or [place]. Ground chicken or [place]. Pork with fat trimmed off. Fish and seafood. Egg whites. Dried beans, peas, or lentils. Unsalted nuts, nut butters, and seeds. Unsalted canned beans. Lean cuts of beef with fat trimmed off. Low-sodium, lean deli meat. Dairy Low-fat (1%) or fat-free (skim) milk. Fat-free, low-fat, or reduced-fat cheeses. Nonfat, low-sodium ricotta or cottage cheese. Low-fat or nonfat yogurt. Low-fat, low-sodium cheese. Fats and oils Soft margarine without trans fats. Vegetable oil. Low-fat, reduced-fat, or light mayonnaise and salad dressings (reduced-sodium). Canola, safflower, olive, soybean, and sunflower oils. Avocado. Seasoning and other foods Herbs. Spices. Seasoning mixes without salt. Unsalted popcorn and pretzels. Fat-free sweets. What foods are not recommended? The items listed may not be a complete list. Talk with your dietitian about what dietary choices are best for you. Grains Baked goods made with fat, such as croissants, muffins, or some breads. Dry pasta or rice meal packs. Vegetables Creamed or fried vegetables. Vegetables in a cheese sauce. Regular canned vegetables (not low-sodium or reduced-sodium). Regular canned tomato sauce and paste (not low-sodium or reduced-sodium). Regular tomato and vegetable juice (not low-sodium or reduced-sodium). Angie Fava. Olives. Fruits Canned fruit in a light or heavy syrup. Fried fruit. Fruit in cream or butter sauce. Meat and other protein foods Fatty cuts of meat. Ribs. Fried meat. Berniece Salines. Sausage. Bologna and other processed lunch meats. Salami. Fatback. Hotdogs. Bratwurst. Salted nuts and seeds. Canned beans with added salt. Canned or smoked fish. Whole eggs or egg yolks. Chicken or [place] with skin. Dairy Whole or 2% milk, cream, and half-and-half. Whole or full-fat cream cheese. Whole-fat or sweetened yogurt. Full-fat cheese. Nondairy creamers. Whipped toppings.  Processed cheese and cheese spreads. Fats and oils Butter. Stick margarine. Lard. Shortening. Ghee. Bacon fat. Tropical oils, such as coconut, palm kernel, or palm oil. Seasoning and other foods Salted popcorn and pretzels. Onion salt, garlic salt, seasoned salt, table salt, and sea salt. Worcestershire sauce. Tartar sauce. Barbecue sauce. Teriyaki sauce. Soy sauce, including reduced-sodium. Steak sauce. Canned and packaged gravies. Fish sauce. Oyster sauce. Cocktail sauce. Horseradish that you find on the shelf. Ketchup. Mustard. Meat flavorings and tenderizers. Bouillon cubes. Hot sauce and Tabasco sauce. Premade or packaged marinades. Premade or packaged taco seasonings. Relishes. Regular salad dressings. Where to find more information:  National Heart, Lung, and [place]: https://wilson-eaton.com/  American Heart Association: www.heart.org Summary  The DASH eating plan is a healthy eating plan that has been shown to reduce high blood pressure (hypertension). It may also reduce your risk for type 2 diabetes, heart disease, and stroke.  With the DASH eating plan, you should limit salt (sodium) intake to 2,300 mg a day. If you have hypertension, you may need to reduce your sodium intake to 1,500 mg a day.  When on the DASH eating plan, aim to eat more fresh fruits and vegetables, whole grains, lean proteins, low-fat dairy, and heart-healthy fats.  Work with your health care provider or diet and nutrition specialist (dietitian) to adjust your eating plan to your individual  calorie needs. This information is not intended to replace advice given to you by your health care provider. Make sure you discuss any questions you have with your health care provider. Document Released: 03/20/2011 Document Revised: 03/24/2016 Document Reviewed: 03/24/2016 Elsevier Interactive Patient Education  2018 [place] Maintenance, Female Adopting a healthy lifestyle and getting preventive care can go a  long way to promote health and wellness. Talk with your health care provider about what schedule of regular examinations is right for you. This is a good chance for you to check in with your provider about disease prevention and staying healthy. In between checkups, there are plenty of things you can do on your own. Experts have done a lot of research about which lifestyle changes and preventive measures are most likely to keep you healthy. Ask your health care provider for more information. Weight and diet Eat a healthy diet  Be sure to include plenty of vegetables, fruits, low-fat dairy products, and lean protein.  Do not eat a lot of foods high in solid fats, added sugars, or salt.  Get regular exercise. This is one of the most important things you can do for your health. ? Most adults should exercise for at least 150 minutes each week. The exercise should increase your heart rate and make you sweat (moderate-intensity exercise). ? Most adults should also do strengthening exercises at least twice a week. This is in addition to the moderate-intensity exercise.  Maintain a healthy weight  Body mass index (BMI) is a measurement that can be used to identify possible weight problems. It estimates body fat based on height and weight. Your health care provider can help determine your BMI and help you achieve or maintain a healthy weight.  For females 83 years of age and older: ? A BMI below 18.5 is considered underweight. ? A BMI of 18.5 to 24.9 is normal. ? A BMI of 25 to 29.9 is considered overweight. ? A BMI of 30 and above is considered obese.  Watch levels of cholesterol and blood lipids  You should start having your blood tested for lipids and cholesterol at 67 years of age, then have this test every 5 years.  You may need to have your cholesterol levels checked more often if: ? Your lipid or cholesterol levels are high. ? You are older than 67 years of age. ? You are at high risk for  heart disease.  Cancer screening Lung Cancer  Lung cancer screening is recommended for adults 58-100 years old who are at high risk for lung cancer because of a history of smoking.  A yearly low-dose CT scan of the lungs is recommended for people who: ? Currently smoke. ? Have quit within the past 15 years. ? Have at least a 30-pack-year history of smoking. A pack year is smoking an average of one pack of cigarettes a day for 1 year.  Yearly screening should continue until it has been 15 years since you quit.  Yearly screening should stop if you develop a health problem that would prevent you from having lung cancer treatment.  Breast Cancer  Practice breast self-awareness. This means understanding how your breasts normally appear and feel.  It also means doing regular breast self-exams. Let your health care provider know about any changes, no matter how small.  If you are in your 20s or 30s, you should have a clinical breast exam (CBE) by a health care provider every 1-3 years as part of a regular  health exam.  If you are 40 or older, have a CBE every year. Also consider having a breast X-ray (mammogram) every year.  If you have a family history of breast cancer, talk to your health care provider about genetic screening.  If you are at high risk for breast cancer, talk to your health care provider about having an MRI and a mammogram every year.  Breast cancer gene (BRCA) assessment is recommended for women who have family members with BRCA-related cancers. BRCA-related cancers include: ? Breast. ? Ovarian. ? Tubal. ? Peritoneal cancers.  Results of the assessment will determine the need for genetic counseling and BRCA1 and BRCA2 testing.  Cervical Cancer Your health care provider may recommend that you be screened regularly for cancer of the pelvic organs (ovaries, uterus, and vagina). This screening involves a pelvic examination, including checking for microscopic changes to  the surface of your cervix (Pap test). You may be encouraged to have this screening done every 3 years, beginning at age 68.  For women ages 31-65, health care providers may recommend pelvic exams and Pap testing every 3 years, or they may recommend the Pap and pelvic exam, combined with testing for human papilloma virus (HPV), every 5 years. Some types of HPV increase your risk of cervical cancer. Testing for HPV may also be done on women of any age with unclear Pap test results.  Other health care providers may not recommend any screening for nonpregnant women who are considered low risk for pelvic cancer and who do not have symptoms. Ask your health care provider if a screening pelvic exam is right for you.  If you have had past treatment for cervical cancer or a condition that could lead to cancer, you need Pap tests and screening for cancer for at least 20 years after your treatment. If Pap tests have been discontinued, your risk factors (such as having a new sexual partner) need to be reassessed to determine if screening should resume. Some women have medical problems that increase the chance of getting cervical cancer. In these cases, your health care provider may recommend more frequent screening and Pap tests.  Colorectal Cancer  This type of cancer can be detected and often prevented.  Routine colorectal cancer screening usually begins at 67 years of age and continues through 67 years of age.  Your health care provider may recommend screening at an earlier age if you have risk factors for colon cancer.  Your health care provider may also recommend using home test kits to check for hidden blood in the stool.  A small camera at the end of a tube can be used to examine your colon directly (sigmoidoscopy or colonoscopy). This is done to check for the earliest forms of colorectal cancer.  Routine screening usually begins at age 17.  Direct examination of the colon should be repeated every  5-10 years through 67 years of age. However, you may need to be screened more often if early forms of precancerous polyps or small growths are found.  Skin Cancer  Check your skin from head to toe regularly.  Tell your health care provider about any new moles or changes in moles, especially if there is a change in a mole's shape or color.  Also tell your health care provider if you have a mole that is larger than the size of a pencil eraser.  Always use sunscreen. Apply sunscreen liberally and repeatedly throughout the day.  Protect yourself by wearing long sleeves, pants, a  wide-brimmed hat, and sunglasses whenever you are outside.  Heart disease, diabetes, and high blood pressure  High blood pressure causes heart disease and increases the risk of stroke. High blood pressure is more likely to develop in: ? People who have blood pressure in the high end of the normal range (130-139/85-89 mm Hg). ? People who are overweight or obese. ? People who are African American.  If you are 85-89 years of age, have your blood pressure checked every 3-5 years. If you are 32 years of age or older, have your blood pressure checked every year. You should have your blood pressure measured twice-once when you are at a hospital or clinic, and once when you are not at a hospital or clinic. Record the average of the two measurements. To check your blood pressure when you are not at a hospital or clinic, you can use: ? An automated blood pressure machine at a pharmacy. ? A home blood pressure monitor.  If you are between 22 years and 54 years old, ask your health care provider if you should take aspirin to prevent strokes.  Have regular diabetes screenings. This involves taking a blood sample to check your fasting blood sugar level. ? If you are at a normal weight and have a low risk for diabetes, have this test once every three years after 67 years of age. ? If you are overweight and have a high risk for  diabetes, consider being tested at a younger age or more often. Preventing infection Hepatitis B  If you have a higher risk for hepatitis B, you should be screened for this virus. You are considered at high risk for hepatitis B if: ? You were born in a country where hepatitis B is common. Ask your health care provider which countries are considered high risk. ? Your parents were born in a high-risk country, and you have not been immunized against hepatitis B (hepatitis B vaccine). ? You have HIV or AIDS. ? You use needles to inject street drugs. ? You live with someone who has hepatitis B. ? You have had sex with someone who has hepatitis B. ? You get hemodialysis treatment. ? You take certain medicines for conditions, including cancer, organ transplantation, and autoimmune conditions.  Hepatitis C  Blood testing is recommended for: ? Everyone born from [birth year] through 1965. ? Anyone with known risk factors for hepatitis C.  Sexually transmitted infections (STIs)  You should be screened for sexually transmitted infections (STIs) including gonorrhea and chlamydia if: ? You are sexually active and are younger than 67 years of age. ? You are older than 67 years of age and your health care provider tells you that you are at risk for this type of infection. ? Your sexual activity has changed since you were last screened and you are at an increased risk for chlamydia or gonorrhea. Ask your health care provider if you are at risk.  If you do not have HIV, but are at risk, it may be recommended that you take a prescription medicine daily to prevent HIV infection. This is called pre-exposure prophylaxis (PrEP). You are considered at risk if: ? You are sexually active and do not regularly use condoms or know the HIV status of your partner(s). ? You take drugs by injection. ? You are sexually active with a partner who has HIV.  Talk with your health care provider about whether you are at high risk  of being infected with HIV. If you choose to  begin PrEP, you should first be tested for HIV. You should then be tested every 3 months for as long as you are taking PrEP. Pregnancy  If you are premenopausal and you may become pregnant, ask your health care provider about preconception counseling.  If you may become pregnant, take 400 to 800 micrograms (mcg) of folic acid every day.  If you want to prevent pregnancy, talk to your health care provider about birth control (contraception). Osteoporosis and menopause  Osteoporosis is a disease in which the bones lose minerals and strength with aging. This can result in serious bone fractures. Your risk for osteoporosis can be identified using a bone density scan.  If you are 32 years of age or older, or if you are at risk for osteoporosis and fractures, ask your health care provider if you should be screened.  Ask your health care provider whether you should take a calcium or vitamin D supplement to lower your risk for osteoporosis.  Menopause may have certain physical symptoms and risks.  Hormone replacement therapy may reduce some of these symptoms and risks. Talk to your health care provider about whether hormone replacement therapy is right for you. Follow these instructions at home:  Schedule regular health, dental, and eye exams.  Stay current with your immunizations.  Do not use any tobacco products including cigarettes, chewing tobacco, or electronic cigarettes.  If you are pregnant, do not drink alcohol.  If you are breastfeeding, limit how much and how often you drink alcohol.  Limit alcohol intake to no more than 1 drink per day for nonpregnant women. One drink equals 12 ounces of beer, 5 ounces of wine, or 1 ounces of hard liquor.  Do not use street drugs.  Do not share needles.  Ask your health care provider for help if you need support or information about quitting drugs.  Tell your health care provider if you often  feel depressed.  Tell your health care provider if you have ever been abused or do not feel safe at home. This information is not intended to replace advice given to you by your health care provider. Make sure you discuss any questions you have with your health care provider. Document Released: 10/14/2010 Document Revised: 09/06/2015 Document Reviewed: 01/02/2015 Elsevier Interactive Patient Education  2018 [place].  High Cholesterol High cholesterol is a condition in which the blood has high levels of a white, waxy, fat-like substance (cholesterol). The human body needs small amounts of cholesterol. The liver makes all the cholesterol that the body needs. Extra (excess) cholesterol comes from the food that we eat. Cholesterol is carried from the liver by the blood through the blood vessels. If you have high cholesterol, deposits (plaques) may build up on the walls of your blood vessels (arteries). Plaques make the arteries narrower and stiffer. Cholesterol plaques increase your risk for heart attack and stroke. Work with your health care provider to keep your cholesterol levels in a healthy range. What increases the risk? This condition is more likely to develop in people who:  Eat foods that are high in animal fat (saturated fat) or cholesterol.  Are overweight.  Are not getting enough exercise.  Have a family history of high cholesterol.  What are the signs or symptoms? There are no symptoms of this condition. How is this diagnosed? This condition may be diagnosed from the results of a blood test.  If you are older than age 56, your health care provider may check your cholesterol every  4-6 years.  You may be checked more often if you already have high cholesterol or other risk factors for heart disease.  The blood test for cholesterol measures:  "Bad" cholesterol (LDL cholesterol). This is the main type of cholesterol that causes heart disease. The desired level for LDL is  less than 100.  "Good" cholesterol (HDL cholesterol). This type helps to protect against heart disease by cleaning the arteries and carrying the LDL away. The desired level for HDL is 60 or higher.  Triglycerides. These are fats that the body can store or burn for energy. The desired number for triglycerides is lower than 150.  Total cholesterol. This is a measure of the total amount of cholesterol in your blood, including LDL cholesterol, HDL cholesterol, and triglycerides. A healthy number is less than 200.  How is this treated? This condition is treated with diet changes, lifestyle changes, and medicines. Diet changes  This may include eating more whole grains, fruits, vegetables, nuts, and fish.  This may also include cutting back on red meat and foods that have a lot of added sugar. Lifestyle changes  Changes may include getting at least 40 minutes of aerobic exercise 3 times a week. Aerobic exercises include walking, biking, and swimming. Aerobic exercise along with a healthy diet can help you maintain a healthy weight.  Changes may also include quitting smoking. Medicines  Medicines are usually given if diet and lifestyle changes have failed to reduce your cholesterol to healthy levels.  Your health care provider may prescribe a statin medicine. Statin medicines have been shown to reduce cholesterol, which can reduce the risk of heart disease. Follow these instructions at home: Eating and drinking  If told by your health care provider:  Eat chicken (without skin), fish, veal, shellfish, ground [place] breast, and round or loin cuts of red meat.  Do not eat fried foods or fatty meats, such as hot dogs and salami.  Eat plenty of fruits, such as apples.  Eat plenty of vegetables, such as broccoli, potatoes, and carrots.  Eat beans, peas, and lentils.  Eat grains such as barley, rice, couscous, and bulgur wheat.  Eat pasta without cream sauces.  Use skim or nonfat milk,  and eat low-fat or nonfat yogurt and cheeses.  Do not eat or drink whole milk, cream, ice cream, egg yolks, or hard cheeses.  Do not eat stick margarine or tub margarines that contain trans fats (also called partially hydrogenated oils).  Do not eat saturated tropical oils, such as coconut oil and palm oil.  Do not eat cakes, cookies, crackers, or other baked goods that contain trans fats.  General instructions  Exercise as directed by your health care provider. Increase your activity level with activities such as gardening, walking, and taking the stairs.  Take over-the-counter and prescription medicines only as told by your health care provider.  Do not use any products that contain nicotine or tobacco, such as cigarettes and e-cigarettes. If you need help quitting, ask your health care provider.  Keep all follow-up visits as told by your health care provider. This is important. Contact a health care provider if:  You are struggling to maintain a healthy diet or weight.  You need help to start on an exercise program.  You need help to stop smoking. Get help right away if:  You have chest pain.  You have trouble breathing. This information is not intended to replace advice given to you by your health care provider. Make sure you  discuss any questions you have with your health care provider. Document Released: 03/31/2005 Document Revised: 10/27/2015 Document Reviewed: 09/29/2015 Elsevier Interactive Patient Education  2018 [employer].  How to Increase Your Level of Physical Activity Getting regular physical activity is important for your overall health and well-being. Most people do not get enough exercise. There are easy ways to increase your level of physical activity, even if you have not been very active in the past or you are just starting out. Why is physical activity important? Physical activity has many short-term and long-term health benefits. Regular exercise  can:  Help you lose weight or maintain a healthy weight.  Strengthen your muscles and bones.  Boost your mood and improve self-esteem.  Reduce your risk of certain long-term (chronic) diseases, like heart disease, cancer, and diabetes.  Help you stay capable of walking and moving around (mobile) as you age.  Prevent accidents, such as falls, as you age.  Increase life expectancy.  What are the benefits of being physically active on a regular basis? In addition to improving your physical health, being physically active on most days of the week can help you in ways that you may not expect. Benefits of regular physical activity may include:  Feeling good about your body.  Being able to move around more easily and for longer periods of time without getting tired (increased stamina).  Finding new sources of fun and enjoyment.  Meeting new people who share a common interest.  Being able to fight off illness better (enhanced immunity).  Being able to sleep better.  What can happen if I am not physically active on a regular basis? Not getting enough physical activity can lead to an unhealthy lifestyle and future health problems. This can increase your chances of:  Becoming overweight or obese.  Becoming sick.  Developing chronic illnesses, like heart disease or diabetes.  Having mental health problems, like depression or anxiety.  Having sleep problems.  Having trouble walking or getting yourself around (reduced mobility).  Injuring yourself in a fall as you get older.  What steps can I take to be more physically active?  Check with your health care provider about how to get started. Ask your health care provider what activities are safe for you.  Start out slowly. Walking or doing some simple chair exercises is a good place to start, especially if you have not been active before or for a long time.  Try to find activities that you enjoy. You are more likely to commit to  an exercise routine if it does not feel like a chore.  If you have bone or joint problems, choose low-impact exercises, like walking or swimming.  Include physical activity in your everyday routine.  Invite friends or family members to exercise with you. This also will help you commit to your workout plan.  Set goals that you can work toward.  Aim for at least 150 minutes of moderate-intensity exercise each week. Examples of moderate-intensity exercise include walking or riding a bike. Where to find more information:  Centers for Disease Control and Prevention: BowlingGrip.is  President's [employer] on [employer], Sports & Nutrition www.http://villegas.org/  ChooseMyPlate: WirelessM[employer]ages.dk Contact a health care provider if:  You have headaches, muscle aches, or joint pain.  You feel dizzy or light-headed while exercising.  You faint.  You have chest pain while exercising. Summary  Exercise benefits your mind and body at any age, even if you are just starting out.  If you have a  chronic illness or have not been active for a while, check with your health care provider before increasing your physical activity.  Choose activities that are safe and enjoyable for you.Ask your health care provider what activities are safe for you.  Start slowly. Tell your health care provider if you have problems as you start to increase your activity level. This information is not intended to replace advice given to you by your health care provider. Make sure you discuss any questions you have with your health care provider. Document Released: 03/20/2016 Document Revised: 03/20/2016 Document Reviewed: 03/20/2016 Elsevier Interactive Patient Education  2018 [employer].  How to Take Your Blood Pressure You can take your blood pressure at home with a machine. You may need to check your blood pressure at home:  To check if you have high blood  pressure (hypertension).  To check your blood pressure over time.  To make sure your blood pressure medicine is working.  Supplies needed: You will need a blood pressure machine, or monitor. You can buy one at a drugstore or online. When choosing one:  Choose one with an arm cuff.  Choose one that wraps around your upper arm. Only one finger should fit between your arm and the cuff.  Do not choose one that measures your blood pressure from your wrist or finger.  Your doctor can suggest a monitor. How to prepare Avoid these things for 30 minutes before checking your blood pressure:  Drinking caffeine.  Drinking alcohol.  Eating.  Smoking.  Exercising.  Five minutes before checking your blood pressure:  Pee.  Sit in a dining chair. Avoid sitting in a soft couch or armchair.  Be quiet. Do not talk.  How to take your blood pressure Follow the instructions that came with your machine. If you have a digital blood pressure monitor, these may be the instructions: 1. Sit up straight. 2. Place your feet on the floor. Do not cross your ankles or legs. 3. Rest your left arm at the level of your heart. You may rest it on a table, desk, or chair. 4. Pull up your shirt sleeve. 5. Wrap the blood pressure cuff around the upper part of your left arm. The cuff should be 1 inch (2.5 cm) above your elbow. It is best to wrap the cuff around bare skin. 6. Fit the cuff snugly around your arm. You should be able to place only one finger between the cuff and your arm. 7. Put the cord inside the groove of your elbow. 8. Press the power button. 9. Sit quietly while the cuff fills with air and loses air. 10. Write down the numbers on the screen. 11. Wait 2-3 minutes and then repeat steps 1-10.  What do the numbers mean? Two numbers make up your blood pressure. The first number is called systolic pressure. The second is called diastolic pressure. An example of a blood pressure reading is "120  over 80" (or 120/80). If you are an adult and do not have a medical condition, use this guide to find out if your blood pressure is normal: Normal  First number: below 120.  Second number: below 80. Elevated  First number: 120-129.  Second number: below 80. Hypertension stage 1  First number: 130-139.  Second number: 80-89. Hypertension stage 2  First number: 140 or above.  Second number: 72 or above. Your blood pressure is above normal even if only the top or bottom number is above normal. Follow these instructions at  home:  Check your blood pressure as often as your doctor tells you to.  Take your monitor to your next doctor's appointment. Your doctor will: ? Make sure you are using it correctly. ? Make sure it is working right.  Make sure you understand what your blood pressure numbers should be.  Tell your doctor if your medicines are causing side effects. Contact a doctor if:  Your blood pressure keeps being high. Get help right away if:  Your first blood pressure number is higher than 180.  Your second blood pressure number is higher than 120. This information is not intended to replace advice given to you by your health care provider. Make sure you discuss any questions you have with your health care provider. Document Released: 03/13/2008 Document Revised: 02/27/2016 Document Reviewed: 09/07/2015 Elsevier Interactive Patient Education  [employer].

## 2017-07-16 ENCOUNTER — Other Ambulatory Visit: Payer: Self-pay

## 2017-07-16 DIAGNOSIS — E538 Deficiency of other specified B group vitamins: Secondary | ICD-10-CM

## 2017-07-16 DIAGNOSIS — E7800 Pure hypercholesterolemia, unspecified: Secondary | ICD-10-CM

## 2017-07-16 MED ORDER — CYANOCOBALAMIN 500 MCG PO TABS: 500.0000 ug | ORAL_TABLET | Freq: Every day | ORAL | 2 refills | Status: AC

## 2017-07-16 NOTE — Addendum Note (Signed)
Addended by: Abelino Derrick A on: 07/16/2017 10:05 AM   Modules accepted: Orders

## 2017-07-22 ENCOUNTER — Encounter: Payer: Self-pay | Admitting: Family Medicine

## 2017-07-28 ENCOUNTER — Other Ambulatory Visit: Payer: Self-pay | Admitting: Family Medicine

## 2017-07-28 MED ORDER — LISINOPRIL-HYDROCHLOROTHIAZIDE 20-12.5 MG PO TABS: 1.0000 | ORAL_TABLET | Freq: Every day | ORAL | 2 refills | Status: DC

## 2017-07-28 MED ORDER — CITALOPRAM HYDROBROMIDE 20 MG PO TABS: 20.0000 mg | ORAL_TABLET | Freq: Every day | ORAL | 2 refills | Status: DC

## 2017-07-28 NOTE — Telephone Encounter (Signed)
Last OV: 07/15/17 PCP: [place]: [id number] [address], [place] [phone] (Phone) [phone] (Fax)

## 2017-07-28 NOTE — Telephone Encounter (Signed)
Copied from [place] [id number]. Topic: Quick Communication - Rx Refill/Question >> Jul 28, 2017 12:47 PM Cleaster Corin, [place] wrote: Medication: citalopram (CELEXA) 20 MG tablet [[id number]] lisinopril-hydrochlorothiazide (PRINZIDE,ZESTORETIC) 20-12.5 MG per tablet [[id number]]  Has the patient contacted their pharmacy? no (Agent: If no, request that the patient contact the pharmacy for the refill.) Preferred Pharmacy (with phone number or street name):[id number] [address], [place] [facility] [place] [address] [place] [address] Phone: [phone] Fax: [phone]   Agent: Please be advised that RX refills may take up to 3 business days. We ask that you follow-up with your pharmacy.

## 2017-09-15 ENCOUNTER — Other Ambulatory Visit (INDEPENDENT_AMBULATORY_CARE_PROVIDER_SITE_OTHER): Payer: Medicare Other

## 2017-09-15 ENCOUNTER — Other Ambulatory Visit: Payer: Self-pay | Admitting: Family Medicine

## 2017-09-15 DIAGNOSIS — E538 Deficiency of other specified B group vitamins: Secondary | ICD-10-CM | POA: Diagnosis not present

## 2017-09-15 DIAGNOSIS — E7800 Pure hypercholesterolemia, unspecified: Secondary | ICD-10-CM | POA: Diagnosis not present

## 2017-09-15 LAB — LIPID PANEL
CHOL/HDL RATIO: 4
CHOLESTEROL: 222 mg/dL — AB (ref 0–200)
HDL: 60.4 mg/dL (ref >39.00)
LDL CALC: 133 mg/dL — AB (ref 0–99)
NonHDL: 161.79
Triglycerides: 143 mg/dL (ref 0.0–149.0)
VLDL: 28.6 mg/dL (ref 0.0–40.0)

## 2017-09-15 LAB — VITAMIN B12: Vitamin B-12: 251 pg/mL (ref 211–911)

## 2017-09-15 MED ORDER — OMEPRAZOLE 20 MG PO CPDR: 20.0000 mg | DELAYED_RELEASE_CAPSULE | Freq: Every day | ORAL | 1 refills | Status: DC

## 2017-09-15 NOTE — Telephone Encounter (Signed)
Rx sent in & I left a voicemail for patient letting her know.

## 2017-09-15 NOTE — Telephone Encounter (Signed)
Patient came into the office for lab appointment. Patient is requesting a rx refill on omeprazole (PRILOSEC) 20 MG capsule. Please follow up with patient in reference to this request.

## 2017-10-20 ENCOUNTER — Telehealth: Payer: Self-pay | Admitting: Gastroenterology

## 2017-10-20 NOTE — Telephone Encounter (Signed)
Pt would like to know when she is due for a repeat colon. She thinks that she had one about 4 years ago with Dr. Lyndel Safe.

## 2017-10-20 NOTE — Telephone Encounter (Signed)
Path and procedure report faxed to [facility] office. Patient is calling to verify when she is due for a colonoscopy.. Please, advise.

## 2017-10-21 ENCOUNTER — Other Ambulatory Visit: Payer: Self-pay | Admitting: Family Medicine

## 2017-10-21 DIAGNOSIS — M48062 Spinal stenosis, lumbar region with neurogenic claudication: Secondary | ICD-10-CM

## 2017-10-22 ENCOUNTER — Encounter: Payer: Self-pay | Admitting: Family Medicine

## 2017-10-22 NOTE — Telephone Encounter (Signed)
ERROR

## 2017-10-25 NOTE — Telephone Encounter (Signed)
Can you please give me the report at next clinic

## 2017-10-28 ENCOUNTER — Other Ambulatory Visit: Payer: Self-pay | Admitting: Family Medicine

## 2017-10-29 NOTE — Telephone Encounter (Signed)
I gave a copy of the path and procedure report to Dr Lyndel Safe when he was in the [facility] on 10-26-17.

## 2017-12-26 ENCOUNTER — Other Ambulatory Visit: Payer: Self-pay | Admitting: Family Medicine

## 2018-01-15 ENCOUNTER — Encounter: Payer: Self-pay | Admitting: Gastroenterology

## 2018-01-25 ENCOUNTER — Other Ambulatory Visit: Payer: Self-pay | Admitting: Family Medicine

## 2018-03-16 ENCOUNTER — Ambulatory Visit (INDEPENDENT_AMBULATORY_CARE_PROVIDER_SITE_OTHER): Payer: Medicare Other | Admitting: Family Medicine

## 2018-03-16 ENCOUNTER — Encounter: Payer: Self-pay | Admitting: Family Medicine

## 2018-03-16 VITALS — BP 124/80 | HR 74 | Ht 66.0 in | Wt 161.0 lb

## 2018-03-16 DIAGNOSIS — J301 Allergic rhinitis due to pollen: Secondary | ICD-10-CM

## 2018-03-16 DIAGNOSIS — E7800 Pure hypercholesterolemia, unspecified: Secondary | ICD-10-CM | POA: Diagnosis not present

## 2018-03-16 DIAGNOSIS — I10 Essential (primary) hypertension: Secondary | ICD-10-CM

## 2018-03-16 DIAGNOSIS — E538 Deficiency of other specified B group vitamins: Secondary | ICD-10-CM

## 2018-03-16 DIAGNOSIS — Z23 Encounter for immunization: Secondary | ICD-10-CM | POA: Diagnosis not present

## 2018-03-16 LAB — URINALYSIS, ROUTINE W REFLEX MICROSCOPIC
Bilirubin Urine: NEGATIVE
Hgb urine dipstick: NEGATIVE
Ketones, ur: NEGATIVE
Leukocytes, UA: NEGATIVE
Nitrite: NEGATIVE
RBC / HPF: NONE SEEN (ref 0–?)
Specific Gravity, Urine: 1.015 (ref 1.000–1.030)
TOTAL PROTEIN, URINE-UPE24: NEGATIVE
URINE GLUCOSE: NEGATIVE
UROBILINOGEN UA: 0.2 (ref 0.0–1.0)
pH: 5.5 (ref 5.0–8.0)

## 2018-03-16 LAB — VITAMIN D 25 HYDROXY (VIT D DEFICIENCY, FRACTURES): VITD: 48.26 ng/mL (ref 30.00–100.00)

## 2018-03-16 LAB — CBC
HCT: 42.5 % (ref 36.0–46.0)
Hemoglobin: 14.4 g/dL (ref 12.0–15.0)
MCHC: 33.9 g/dL (ref 30.0–36.0)
MCV: 92.1 fl (ref 78.0–100.0)
PLATELETS: 194 10*3/uL (ref 150.0–400.0)
RBC: 4.61 Mil/uL (ref 3.87–5.11)
RDW: 12.4 % (ref 11.5–15.5)
WBC: 5.4 10*3/uL (ref 4.0–10.5)

## 2018-03-16 LAB — MICROALBUMIN / CREATININE URINE RATIO
Creatinine,U: 77.7 mg/dL
Microalb Creat Ratio: 0.9 mg/g (ref 0.0–30.0)
Microalb, Ur: <0.7 mg/dL (ref 0.0–1.9)

## 2018-03-16 LAB — COMPREHENSIVE METABOLIC PANEL
ALT: 11 U/L (ref 0–35)
AST: 12 U/L (ref 0–37)
Albumin: 4.5 g/dL (ref 3.5–5.2)
Alkaline Phosphatase: 52 U/L (ref 39–117)
BUN: 16 mg/dL (ref 6–23)
CALCIUM: 9.6 mg/dL (ref 8.4–10.5)
CHLORIDE: 102 meq/L (ref 96–112)
CO2: 25 meq/L (ref 19–32)
Creatinine, Ser: 0.79 mg/dL (ref 0.40–1.20)
GFR: 77.13 mL/min (ref >60.00)
Glucose, Bld: 104 mg/dL — ABNORMAL HIGH (ref 70–99)
Potassium: 4.2 mEq/L (ref 3.5–5.1)
Sodium: 138 mEq/L (ref 135–145)
Total Bilirubin: 0.4 mg/dL (ref 0.2–1.2)
Total Protein: 6.8 g/dL (ref 6.0–8.3)

## 2018-03-16 LAB — LIPID PANEL
Cholesterol: 246 mg/dL — ABNORMAL HIGH (ref 0–200)
HDL: 78.7 mg/dL (ref >39.00)
LDL Cholesterol: 147 mg/dL — ABNORMAL HIGH (ref 0–99)
NonHDL: 167.4
Total CHOL/HDL Ratio: 3
Triglycerides: 104 mg/dL (ref 0.0–149.0)
VLDL: 20.8 mg/dL (ref 0.0–40.0)

## 2018-03-16 LAB — VITAMIN B12: VITAMIN B 12: 463 pg/mL (ref 211–911)

## 2018-03-16 LAB — LDL CHOLESTEROL, DIRECT: Direct LDL: 140 mg/dL

## 2018-03-16 MED ORDER — FLUTICASONE PROPIONATE 50 MCG/ACT NA SUSP: 1.0000 | Freq: Every day | NASAL | 5 refills | Status: DC

## 2018-03-16 MED ORDER — AZELASTINE HCL 137 MCG/SPRAY NA SOLN: 1.0000 | NASAL | 6 refills | Status: DC | PRN

## 2018-03-16 NOTE — Patient Instructions (Signed)
Fat and Cholesterol Restricted Diet Getting too much fat and cholesterol in your diet may cause health problems. Following this diet helps keep your fat and cholesterol at normal levels. This can keep you from getting sick. What types of fat should I choose?  Choose monosaturated and polyunsaturated fats. These are found in foods such as olive oil, canola oil, flaxseeds, walnuts, almonds, and seeds.  Eat more omega-3 fats. Good choices include salmon, mackerel, sardines, tuna, flaxseed oil, and ground flaxseeds.  Limit saturated fats. These are in animal products such as meats, butter, and cream. They can also be in plant products such as palm oil, palm kernel oil, and coconut oil.  Avoid foods with partially hydrogenated oils in them. These contain trans fats. Examples of foods that have trans fats are stick margarine, some tub margarines, cookies, crackers, and other baked goods. What general guidelines do I need to follow?  Check food labels. Look for the words "trans fat" and "saturated fat."  When preparing a meal: ? Fill half of your plate with vegetables and green salads. ? Fill one fourth of your plate with whole grains. Look for the word "whole" as the first word in the ingredient list. ? Fill one fourth of your plate with lean protein foods.  Eat more foods that have fiber, like apples, carrots, beans, peas, and barley.  Eat more home-cooked foods. Eat less at restaurants and buffets.  Limit or avoid alcohol.  Limit foods high in starch and sugar.  Limit fried foods.  Cook foods without frying them. Baking, boiling, grilling, and broiling are all great options.  Lose weight if you are overweight. Losing even a small amount of weight can help your overall health. It can also help prevent diseases such as diabetes and heart disease. What foods can I eat? Grains Whole grains, such as whole wheat or whole grain breads, crackers, cereals, and pasta. Unsweetened oatmeal,  bulgur, barley, quinoa, or brown rice. Corn or whole wheat flour tortillas. Vegetables Fresh or frozen vegetables (raw, steamed, roasted, or grilled). Green salads. Fruits All fresh, canned (in natural juice), or frozen fruits. Meat and Other Protein Products Ground beef (85% or leaner), grass-fed beef, or beef trimmed of fat. Skinless chicken or turkey. Ground chicken or turkey. Pork trimmed of fat. All fish and seafood. Eggs. Dried beans, peas, or lentils. Unsalted nuts or seeds. Unsalted canned or dry beans. Dairy Low-fat dairy products, such as skim or 1% milk, 2% or reduced-fat cheeses, low-fat ricotta or cottage cheese, or plain low-fat yogurt. Fats and Oils Tub margarines without trans fats. Light or reduced-fat mayonnaise and salad dressings. Avocado. Olive, canola, sesame, or safflower oils. Natural peanut or almond butter (choose ones without added sugar and oil). The items listed above may not be a complete list of recommended foods or beverages. Contact your dietitian for more options. What foods are not recommended? Grains White bread. White pasta. White rice. Cornbread. Bagels, pastries, and croissants. Crackers that contain trans fat. Vegetables White potatoes. Corn. Creamed or fried vegetables. Vegetables in a cheese sauce. Fruits Dried fruits. Canned fruit in light or heavy syrup. Fruit juice. Meat and Other Protein Products Fatty cuts of meat. Ribs, chicken wings, bacon, sausage, bologna, salami, chitterlings, fatback, hot dogs, bratwurst, and packaged luncheon meats. Liver and organ meats. Dairy Whole or 2% milk, cream, half-and-half, and cream cheese. Whole milk cheeses. Whole-fat or sweetened yogurt. Full-fat cheeses. Nondairy creamers and whipped toppings. Processed cheese, cheese spreads, or cheese curds. Sweets and Desserts Corn   syrup, sugars, honey, and molasses. Candy. Jam and jelly. Syrup. Sweetened cereals. Cookies, pies, cakes, donuts, muffins, and ice  cream. Fats and Oils Butter, stick margarine, lard, shortening, ghee, or bacon fat. Coconut, palm kernel, or palm oils. Beverages Alcohol. Sweetened drinks (such as sodas, lemonade, and fruit drinks or punches). The items listed above may not be a complete list of foods and beverages to avoid. Contact your dietitian for more information. This information is not intended to replace advice given to you by your health care provider. Make sure you discuss any questions you have with your health care provider. Document Released: 09/30/2011 Document Revised: 12/06/2015 Document Reviewed: 06/30/2013 Elsevier Interactive Patient Education  2018 Elsevier Inc.  

## 2018-03-16 NOTE — Progress Notes (Signed)
Established Patient Office Visit  Subjective:  Patient ID: Michele Hanson, female    DOB: [birth year]  Age: 67 y.o. MRN: [id number]  CC:  Chief Complaint  Patient presents with  . Follow-up    HPI Michele Hanson presents for a follow up on her cholesterol. She has been taking her medications as directed.  Patient has been off statins.  She has no direct family history of heart disease.  She does not smoke or and she is not a diabetic.  Her blood pressure is well controlled with the Zestoretic.  She is active physically.  She swims.  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  She continues to be in the process of moving into the house that they bought together.  She has allergy rhinitis symptoms to include sneezing nasal congestion postnasal drip that is been well controlled with Astelin and Flonase use as needed.  Past Medical History:  Diagnosis Date  . GERD (gastroesophageal reflux disease)   . Hyperlipemia   . Hypertension     Past Surgical History:  Procedure Laterality Date  . CHOLECYSTECTOMY      Family History  Family history unknown: Yes    Social History   Socioeconomic History  . Marital status: Widowed    Spouse name: Not on file  . Number of children: Not on file  . Years of education: Not on file  . Highest education level: Not on file  Occupational History  . Not on file  Social Needs  . Financial resource strain: Not on file  . Food insecurity:    Worry: Not on file    Inability: Not on file  . Transportation needs:    Medical: Not on file    Non-medical: Not on file  Tobacco Use  . Smoking status: Never Smoker  . Smokeless tobacco: Never Used  Substance and Sexual Activity  . Alcohol use: Yes    Comment: none today  . Drug use: Not on file  . Sexual activity: Not on file  Lifestyle  . Physical activity:    Days per week: Not on file    Minutes per session: Not on file  .  Stress: Not on file  Relationships  . Social connections:    Talks on phone: Not on file    Gets together: Not on file    Attends religious service: Not on file    Active member of club or organization: Not on file    Attends meetings of clubs or organizations: Not on file    Relationship status: Not on file  . Intimate partner violence:    Fear of current or ex partner: Not on file    Emotionally abused: Not on file    Physically abused: Not on file    Forced sexual activity: Not on file  Other Topics Concern  . Not on file  Social History Narrative  . Not on file    Outpatient Medications Prior to Visit  Medication Sig Dispense Refill  . citalopram (CELEXA) 20 MG tablet TAKE 1 TABLET BY MOUTH DAILY 30 tablet 1  . gabapentin (NEURONTIN) 300 MG capsule Take 1 capsule by mouth 2 (two) times daily.    Marland Kitchen HYDROcodone-acetaminophen (NORCO/VICODIN) 5-325 MG tablet As needed    . lisinopril-hydrochlorothiazide (PRINZIDE,ZESTORETIC) 20-12.5 MG tablet TAKE 1 TABLET BY MOUTH DAILY 30 tablet 1  . methocarbamol (ROBAXIN) 500 MG tablet TAKE 1 TABLET BY MOUTH  EVERY 8 HOURS AS NEEDED FOR MUSCLE SPASMS 60 tablet 0  . omeprazole (PRILOSEC) 20 MG capsule Take 1 capsule (20 mg total) by mouth daily. 90 capsule 1  . vitamin B-12 (CYANOCOBALAMIN) 500 MCG tablet TAKE 1 TABLET BY MOUTH EVERY DAY 30 tablet 5  . Azelastine HCl 137 MCG/SPRAY SOLN Place 1 spray into both nostrils as needed.    . fluticasone (FLONASE) 50 MCG/ACT nasal spray Place 1 spray into the nose Ad lib.     No facility-administered medications prior to visit.     Allergies  Allergen Reactions  . Ciprofloxacin Other (See Comments)    Neuropathy in feet  . Neomycin     ROS Review of Systems  Constitutional: Negative for chills, diaphoresis, fatigue, fever and unexpected weight change.  HENT: Positive for congestion, postnasal drip, rhinorrhea and sneezing. Negative for sinus pressure, sore throat, trouble swallowing and voice  change.   Eyes: Negative for photophobia and visual disturbance.  Respiratory: Negative.   Cardiovascular: Negative.   Gastrointestinal: Negative.   Endocrine: Negative for polyphagia and polyuria.  Genitourinary: Negative for difficulty urinating, frequency and urgency.  Musculoskeletal: Negative for gait problem and joint swelling.  Skin: Negative for pallor and rash.  Allergic/Immunologic: Negative for immunocompromised state.  Neurological: Negative for light-headedness and headaches.  Hematological: Does not bruise/bleed easily.  Psychiatric/Behavioral: Negative.       Objective:    Physical Exam  Constitutional: She is oriented to person, place, and time. She appears well-developed and well-nourished. No distress.  HENT:  Head: Normocephalic and atraumatic.  Right Ear: External ear normal.  Left Ear: External ear normal.  Mouth/Throat: Oropharynx is clear and moist. No oropharyngeal exudate.  Eyes: Pupils are equal, round, and reactive to light. Conjunctivae are normal. Right eye exhibits no discharge. Left eye exhibits no discharge. No scleral icterus.  Neck: Neck supple. No JVD present. No tracheal deviation present. No thyromegaly present.  Cardiovascular: Normal rate, regular rhythm and normal heart sounds.  Pulmonary/Chest: Effort normal and breath sounds normal.  Abdominal: Bowel sounds are normal.  Neurological: She is alert and oriented to person, place, and time.  Skin: Skin is warm and dry. She is not diaphoretic.  Psychiatric: She has a normal mood and affect. Her behavior is normal.    BP 124/80   Pulse 74   Ht 5\' 6"  (1.676 m)   Wt 161 lb (73 kg)   SpO2 94%   BMI 25.99 kg/m  Wt Readings from Last 3 Encounters:  03/16/18 161 lb (73 kg)  07/15/17 162 lb 6 oz (73.7 kg)  08/08/13 161 lb 8 oz (73.3 kg)   BP Readings from Last 3 Encounters:  03/16/18 124/80  07/15/17 110/80  08/08/13 114/77   Health Maintenance Due  Topic Date Due  . Hepatitis C  Screening  1950-11-23  . TETANUS/TDAP  02/04/1970  . MAMMOGRAM  02/04/2001  . PNA vac Low Risk Adult (1 of 2 - PCV13) 02/05/2016    There are no preventive care reminders to display for this patient.  Lab Results  Component Value Date   TSH 1.37 07/15/2017   Lab Results  Component Value Date   WBC 5.4 03/16/2018   HGB 14.4 03/16/2018   HCT 42.5 03/16/2018   MCV 92.1 03/16/2018   PLT 194.0 03/16/2018   Lab Results  Component Value Date   NA 138 03/16/2018   K 4.2 03/16/2018   CHLORIDE 104 08/09/2013   CO2 25 03/16/2018   GLUCOSE 104 (H) 03/16/2018  BUN 16 03/16/2018   CREATININE 0.79 03/16/2018   BILITOT 0.4 03/16/2018   ALKPHOS 52 03/16/2018   AST 12 03/16/2018   ALT 11 03/16/2018   PROT 6.8 03/16/2018   ALBUMIN 4.5 03/16/2018   CALCIUM 9.6 03/16/2018   ANIONGAP 11 08/09/2013   GFR 77.13 03/16/2018   Lab Results  Component Value Date   CHOL 246 (H) 03/16/2018   Lab Results  Component Value Date   HDL 78.70 03/16/2018   Lab Results  Component Value Date   LDLCALC 147 (H) 03/16/2018   Lab Results  Component Value Date   TRIG 104.0 03/16/2018   Lab Results  Component Value Date   CHOLHDL 3 03/16/2018   No results found for: HGBA1C    The 10-year ASCVD risk score Mikey Bussing DC Jr., et al., 2013) is: 8.4%   Values used to calculate the score:     Age: 62 years     Sex: Female     Is Non-Hispanic African American: No     Diabetic: No     Tobacco smoker: No     Systolic Blood Pressure: 829 mmHg     Is BP treated: Yes     HDL Cholesterol: 78.7 mg/dL     Total Cholesterol: 246 mg/dL Assessment & Plan:   Problem List Items Addressed This Visit      Cardiovascular and Mediastinum   Essential hypertension - Primary   Relevant Medications   atorvastatin (LIPITOR) 20 MG tablet   Other Relevant Orders   CBC (Completed)   Comprehensive metabolic panel (Completed)   VITAMIN D 25 Hydroxy (Vit-D Deficiency, Fractures) (Completed)   Urinalysis, Routine w  reflex microscopic (Completed)   Microalbumin / creatinine urine ratio (Completed)     Respiratory   Seasonal allergic rhinitis due to pollen   Relevant Medications   Azelastine HCl 137 MCG/SPRAY SOLN   fluticasone (FLONASE) 50 MCG/ACT nasal spray     Other   Elevated cholesterol   Relevant Medications   atorvastatin (LIPITOR) 20 MG tablet   Other Relevant Orders   CBC (Completed)   Comprehensive metabolic panel (Completed)   LDL cholesterol, direct (Completed)   Lipid panel (Completed)   B12 deficiency   Relevant Orders   Vitamin B12 (Completed)    Other Visit Diagnoses    Need for immunization against influenza       Relevant Orders   Flu vaccine HIGH DOSE PF (Completed)      Meds ordered this encounter  Medications  . DISCONTD: Azelastine HCl 137 MCG/SPRAY SOLN    Sig: Place 1 spray into both nostrils as needed.    Dispense:  1 Bottle    Refill:  6  . DISCONTD: fluticasone (FLONASE) 50 MCG/ACT nasal spray    Sig: Place 1 spray into both nostrils daily.    Dispense:  16 g    Refill:  5  . Azelastine HCl 137 MCG/SPRAY SOLN    Sig: Place 1 spray into both nostrils as needed.    Dispense:  1 Bottle    Refill:  6  . fluticasone (FLONASE) 50 MCG/ACT nasal spray    Sig: Place 1 spray into both nostrils daily.    Dispense:  16 g    Refill:  5  . atorvastatin (LIPITOR) 20 MG tablet    Sig: Take 1 tablet (20 mg total) by mouth daily.    Dispense:  90 tablet    Refill:  3    Follow-up: Return in about 6  months (around 09/15/2018).

## 2018-03-17 MED ORDER — ATORVASTATIN CALCIUM 20 MG PO TABS: 20.0000 mg | ORAL_TABLET | Freq: Every day | ORAL | 3 refills | Status: DC

## 2018-03-24 ENCOUNTER — Other Ambulatory Visit: Payer: Self-pay | Admitting: Family Medicine

## 2018-03-24 DIAGNOSIS — M48062 Spinal stenosis, lumbar region with neurogenic claudication: Secondary | ICD-10-CM

## 2018-03-26 ENCOUNTER — Other Ambulatory Visit: Payer: Self-pay | Admitting: Family Medicine

## 2018-04-16 ENCOUNTER — Other Ambulatory Visit: Payer: Self-pay | Admitting: Family Medicine

## 2018-06-30 DIAGNOSIS — D472 Monoclonal gammopathy: Secondary | ICD-10-CM | POA: Diagnosis not present

## 2018-07-24 ENCOUNTER — Other Ambulatory Visit: Payer: Self-pay | Admitting: Family Medicine

## 2018-09-15 ENCOUNTER — Encounter: Payer: Self-pay | Admitting: Family Medicine

## 2018-09-15 ENCOUNTER — Ambulatory Visit (INDEPENDENT_AMBULATORY_CARE_PROVIDER_SITE_OTHER): Payer: Medicare Other | Admitting: Family Medicine

## 2018-09-15 DIAGNOSIS — E7800 Pure hypercholesterolemia, unspecified: Secondary | ICD-10-CM

## 2018-09-15 DIAGNOSIS — E538 Deficiency of other specified B group vitamins: Secondary | ICD-10-CM

## 2018-09-15 DIAGNOSIS — Z0000 Encounter for general adult medical examination without abnormal findings: Secondary | ICD-10-CM

## 2018-09-15 DIAGNOSIS — G629 Polyneuropathy, unspecified: Secondary | ICD-10-CM

## 2018-09-15 DIAGNOSIS — I10 Essential (primary) hypertension: Secondary | ICD-10-CM | POA: Diagnosis not present

## 2018-09-15 DIAGNOSIS — F418 Other specified anxiety disorders: Secondary | ICD-10-CM | POA: Insufficient documentation

## 2018-09-15 MED ORDER — CITALOPRAM HYDROBROMIDE 20 MG PO TABS: 20.0000 mg | ORAL_TABLET | Freq: Every day | ORAL | 1 refills | Status: DC

## 2018-09-15 MED ORDER — GABAPENTIN 300 MG PO CAPS: 300.0000 mg | ORAL_CAPSULE | Freq: Two times a day (BID) | ORAL | 1 refills | Status: DC

## 2018-09-15 MED ORDER — ATORVASTATIN CALCIUM 20 MG PO TABS: 20.0000 mg | ORAL_TABLET | Freq: Every day | ORAL | 3 refills | Status: DC

## 2018-09-15 MED ORDER — LISINOPRIL-HYDROCHLOROTHIAZIDE 20-12.5 MG PO TABS: 1.0000 | ORAL_TABLET | Freq: Every day | ORAL | 1 refills | Status: DC

## 2018-09-15 NOTE — Progress Notes (Signed)
Established Patient Office Visit  Subjective:  Patient ID: Michele Hanson, female    DOB: [birth year]  Age: 68 y.o. MRN: [id number]  CC:  Chief Complaint  Patient presents with  . Follow-up    HPI Michele Hanson presents for follow-up of her hypertension that is well controlled with the Zestoretic only.  Blood pressures running in the 120s over 70-80 range.  She is having no issues taking the medication.  For her cholesterol she is taking half of a 20 mg of atorvastatin.  She has been taking Celexa for many years for depression and anxiety.  Feels as though she is much better taking the medicine.  When she is tried to stop it in the past things just do not seem right.  Her life seems to go better on the medication.  It is been a long-term medication for her.  She experiences tingling in both of her feet.  This is treated adequately with Neurontin by her orthopedic surgeon.  She is planning on seeing neurosurgeon in [place] and asked me to refill it for her.  She has a history of spinal stenosis and is considering minimally invasive surgery.  She also deals with hip bursitis.  Continues to enjoy entire retirement.  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  She is active taking care of her 3 rescue dogs.   Past Medical History:  Diagnosis Date  . GERD (gastroesophageal reflux disease)   . Hyperlipemia   . Hypertension     Past Surgical History:  Procedure Laterality Date  . CHOLECYSTECTOMY      Family History  Family history unknown: Yes    Social History   Socioeconomic History  . Marital status: Married    Spouse name: Not on file  . Number of children: Not on file  . Years of education: Not on file  . Highest education level: Not on file  Occupational History  . Not on file  Social Needs  . Financial resource strain: Not on file  . Food insecurity:    Worry: Not on file    Inability: Not on file  . Transportation needs:    Medical: Not on file    Non-medical: Not on  file  Tobacco Use  . Smoking status: Never Smoker  . Smokeless tobacco: Never Used  Substance and Sexual Activity  . Alcohol use: Yes    Comment: none today  . Drug use: Not on file  . Sexual activity: Not on file  Lifestyle  . Physical activity:    Days per week: Not on file    Minutes per session: Not on file  . Stress: Not on file  Relationships  . Social connections:    Talks on phone: Not on file    Gets together: Not on file    Attends religious service: Not on file    Active member of club or organization: Not on file    Attends meetings of clubs or organizations: Not on file    Relationship status: Not on file  . Intimate partner violence:    Fear of current or ex partner: Not on file    Emotionally abused: Not on file    Physically abused: Not on file    Forced sexual activity: Not on file  Other Topics Concern  . Not on file  Social History Narrative  . Not on file    Outpatient Medications Prior to Visit  Medication Sig Dispense Refill  . Azelastine HCl 137 MCG/SPRAY  SOLN Place 1 spray into both nostrils as needed. 1 Bottle 6  . HYDROcodone-acetaminophen (NORCO/VICODIN) 5-325 MG tablet As needed    . methocarbamol (ROBAXIN) 500 MG tablet TAKE 1 TABLET BY MOUTH EVERY 8 HOURS AS NEEDED FOR MUSCLE SPASMS 60 tablet 0  . omeprazole (PRILOSEC) 20 MG capsule TAKE 1 CAPSULE(20 MG) BY MOUTH DAILY 90 capsule 1  . vitamin B-12 (CYANOCOBALAMIN) 500 MCG tablet TAKE 1 TABLET BY MOUTH EVERY DAY 30 tablet 5  . atorvastatin (LIPITOR) 20 MG tablet Take 1 tablet (20 mg total) by mouth daily. 90 tablet 3  . citalopram (CELEXA) 20 MG tablet TAKE ONE TABLET BY MOUTH ONE TIME DAILY 30 tablet 2  . gabapentin (NEURONTIN) 300 MG capsule Take 1 capsule by mouth 2 (two) times daily.    Marland Kitchen lisinopril-hydrochlorothiazide (PRINZIDE,ZESTORETIC) 20-12.5 MG tablet TAKE ONE TABLET BY MOUTH ONE TIME DAILY 30 tablet 2  . fluticasone (FLONASE) 50 MCG/ACT nasal spray Place 1 spray into both nostrils  daily. 16 g 5   No facility-administered medications prior to visit.     Allergies  Allergen Reactions  . Ciprofloxacin Other (See Comments)    Neuropathy in feet  . Neomycin     ROS Review of Systems  Constitutional: Negative.   Eyes: Negative for photophobia and visual disturbance.  Respiratory: Negative.   Cardiovascular: Negative.   Gastrointestinal: Negative.   Endocrine: Negative for polyphagia and polyuria.  Musculoskeletal: Positive for back pain.  Skin: Negative for pallor and rash.  Neurological: Negative for numbness.  Hematological: Does not bruise/bleed easily.  Psychiatric/Behavioral: Negative for dysphoric mood. The patient is not nervous/anxious.       Objective:    Physical Exam  Constitutional: She is oriented to person, place, and time. She appears well-developed and well-nourished. No distress.  HENT:  Head: Normocephalic and atraumatic.  Right Ear: External ear normal.  Left Ear: External ear normal.  Eyes: Right eye exhibits no discharge. Left eye exhibits no discharge. No scleral icterus.  Neck: No JVD present. No tracheal deviation present.  Pulmonary/Chest: Effort normal. No stridor.  Neurological: She is alert and oriented to person, place, and time.  Skin: Skin is warm and dry. She is not diaphoretic.  Psychiatric: She has a normal mood and affect. Her behavior is normal.    There were no vitals taken for this visit. Wt Readings from Last 3 Encounters:  03/16/18 161 lb (73 kg)  07/15/17 162 lb 6 oz (73.7 kg)  08/08/13 161 lb 8 oz (73.3 kg)   BP Readings from Last 3 Encounters:  03/16/18 124/80  07/15/17 110/80  08/08/13 114/77   Guideline developer:  UpToDate (see UpToDate for funding source) Date Released: June 2014  Health Maintenance Due  Topic Date Due  . Hepatitis C Screening  Sep 07, 1950  . TETANUS/TDAP  02/04/1970  . MAMMOGRAM  02/04/2001  . PNA vac Low Risk Adult (1 of 2 - PCV13) 02/05/2016    There are no preventive  care reminders to display for this patient.  Lab Results  Component Value Date   TSH 1.37 07/15/2017   Lab Results  Component Value Date   WBC 5.4 03/16/2018   HGB 14.4 03/16/2018   HCT 42.5 03/16/2018   MCV 92.1 03/16/2018   PLT 194.0 03/16/2018   Lab Results  Component Value Date   NA 138 03/16/2018   K 4.2 03/16/2018   CHLORIDE 104 08/09/2013   CO2 25 03/16/2018   GLUCOSE 104 (H) 03/16/2018   BUN 16 03/16/2018  CREATININE 0.79 03/16/2018   BILITOT 0.4 03/16/2018   ALKPHOS 52 03/16/2018   AST 12 03/16/2018   ALT 11 03/16/2018   PROT 6.8 03/16/2018   ALBUMIN 4.5 03/16/2018   CALCIUM 9.6 03/16/2018   ANIONGAP 11 08/09/2013   GFR 77.13 03/16/2018   Lab Results  Component Value Date   CHOL 246 (H) 03/16/2018   Lab Results  Component Value Date   HDL 78.70 03/16/2018   Lab Results  Component Value Date   LDLCALC 147 (H) 03/16/2018   Lab Results  Component Value Date   TRIG 104.0 03/16/2018   Lab Results  Component Value Date   CHOLHDL 3 03/16/2018   No results found for: HGBA1C    Assessment & Plan:   Problem List Items Addressed This Visit      Cardiovascular and Mediastinum   Essential hypertension - Primary   Relevant Medications   atorvastatin (LIPITOR) 20 MG tablet   lisinopril-hydrochlorothiazide (ZESTORETIC) 20-12.5 MG tablet   Other Relevant Orders   CBC   Comprehensive metabolic panel   Urinalysis, Routine w reflex microscopic     Nervous and Auditory   Neuropathy   Relevant Medications   gabapentin (NEURONTIN) 300 MG capsule     Other   Elevated cholesterol   Relevant Medications   atorvastatin (LIPITOR) 20 MG tablet   lisinopril-hydrochlorothiazide (ZESTORETIC) 20-12.5 MG tablet   Other Relevant Orders   CBC   Comprehensive metabolic panel   Lipid panel   Healthcare maintenance   B12 deficiency   Relevant Orders   Vitamin B12   Depression with anxiety   Relevant Medications   citalopram (CELEXA) 20 MG tablet       Meds ordered this encounter  Medications  . atorvastatin (LIPITOR) 20 MG tablet    Sig: Take 1 tablet (20 mg total) by mouth daily.    Dispense:  90 tablet    Refill:  3  . lisinopril-hydrochlorothiazide (ZESTORETIC) 20-12.5 MG tablet    Sig: Take 1 tablet by mouth daily.    Dispense:  90 tablet    Refill:  1  . citalopram (CELEXA) 20 MG tablet    Sig: Take 1 tablet (20 mg total) by mouth daily.    Dispense:  90 tablet    Refill:  1  . gabapentin (NEURONTIN) 300 MG capsule    Sig: Take 1 capsule (300 mg total) by mouth 2 (two) times daily.    Dispense:  180 capsule    Refill:  1    Follow-up: Return in about 6 months (around 03/17/2019), or if symptoms worsen or fail to improve.   Virtual Visit via Video Note  I connected with Michele Hanson on 09/15/18 at  9:30 AM EDT by a video enabled telemedicine application and verified that I am speaking with the correct person using two identifiers.  Location: Patient: home Provider:    I discussed the limitations of evaluation and management by telemedicine and the availability of in person appointments. The patient expressed understanding and agreed to proceed.  History of Present Illness:    Observations/Objective:   Assessment and Plan:   Follow Up Instructions:    I discussed the assessment and treatment plan with the patient. The patient was provided an opportunity to ask questions and all were answered. The patient agreed with the plan and demonstrated an understanding of the instructions.   The patient was advised to call back or seek an in-person evaluation if the symptoms worsen or if the condition  fails to improve as anticipated.  I provided 25 minutes of non-face-to-face time during this encounter.   Libby Maw, MD

## 2018-09-24 ENCOUNTER — Other Ambulatory Visit (INDEPENDENT_AMBULATORY_CARE_PROVIDER_SITE_OTHER): Payer: Medicare Other

## 2018-09-24 DIAGNOSIS — E7800 Pure hypercholesterolemia, unspecified: Secondary | ICD-10-CM | POA: Diagnosis not present

## 2018-09-24 DIAGNOSIS — E538 Deficiency of other specified B group vitamins: Secondary | ICD-10-CM | POA: Diagnosis not present

## 2018-09-24 DIAGNOSIS — I10 Essential (primary) hypertension: Secondary | ICD-10-CM | POA: Diagnosis not present

## 2018-09-24 LAB — LIPID PANEL
Cholesterol: 187 mg/dL (ref 0–200)
HDL: 75.7 mg/dL (ref >39.00)
LDL Cholesterol: 86 mg/dL (ref 0–99)
NonHDL: 110.9
Total CHOL/HDL Ratio: 2
Triglycerides: 123 mg/dL (ref 0.0–149.0)
VLDL: 24.6 mg/dL (ref 0.0–40.0)

## 2018-09-24 LAB — COMPREHENSIVE METABOLIC PANEL
ALT: 14 U/L (ref 0–35)
AST: 15 U/L (ref 0–37)
Albumin: 4.3 g/dL (ref 3.5–5.2)
Alkaline Phosphatase: 65 U/L (ref 39–117)
BUN: 18 mg/dL (ref 6–23)
CO2: 24 mEq/L (ref 19–32)
Calcium: 9 mg/dL (ref 8.4–10.5)
Chloride: 104 mEq/L (ref 96–112)
Creatinine, Ser: 0.94 mg/dL (ref 0.40–1.20)
GFR: 59.28 mL/min — ABNORMAL LOW (ref >60.00)
Glucose, Bld: 99 mg/dL (ref 70–99)
Potassium: 4.4 mEq/L (ref 3.5–5.1)
Sodium: 140 mEq/L (ref 135–145)
Total Bilirubin: 0.4 mg/dL (ref 0.2–1.2)
Total Protein: 6.6 g/dL (ref 6.0–8.3)

## 2018-09-24 LAB — CBC
HCT: 40.3 % (ref 36.0–46.0)
Hemoglobin: 13.4 g/dL (ref 12.0–15.0)
MCHC: 33.2 g/dL (ref 30.0–36.0)
MCV: 93 fl (ref 78.0–100.0)
Platelets: 186 10*3/uL (ref 150.0–400.0)
RBC: 4.34 Mil/uL (ref 3.87–5.11)
RDW: 12.5 % (ref 11.5–15.5)
WBC: 5 10*3/uL (ref 4.0–10.5)

## 2018-09-24 LAB — VITAMIN B12: Vitamin B-12: 248 pg/mL (ref 211–911)

## 2018-11-10 ENCOUNTER — Other Ambulatory Visit: Payer: Self-pay

## 2018-11-10 DIAGNOSIS — M48062 Spinal stenosis, lumbar region with neurogenic claudication: Secondary | ICD-10-CM

## 2018-11-10 MED ORDER — METHOCARBAMOL 500 MG PO TABS: ORAL_TABLET | ORAL | 0 refills | Status: DC

## 2019-01-17 ENCOUNTER — Telehealth: Payer: Self-pay

## 2019-01-17 NOTE — Telephone Encounter (Signed)

## 2019-01-18 ENCOUNTER — Other Ambulatory Visit: Payer: Self-pay

## 2019-01-18 ENCOUNTER — Encounter: Payer: Self-pay | Admitting: Family Medicine

## 2019-01-18 ENCOUNTER — Ambulatory Visit (INDEPENDENT_AMBULATORY_CARE_PROVIDER_SITE_OTHER): Payer: Medicare Other | Admitting: Family Medicine

## 2019-01-18 VITALS — BP 126/80 | HR 81 | Ht 66.0 in | Wt 164.0 lb

## 2019-01-18 DIAGNOSIS — M25541 Pain in joints of right hand: Secondary | ICD-10-CM

## 2019-01-18 DIAGNOSIS — N289 Disorder of kidney and ureter, unspecified: Secondary | ICD-10-CM

## 2019-01-18 DIAGNOSIS — Z23 Encounter for immunization: Secondary | ICD-10-CM | POA: Diagnosis not present

## 2019-01-18 DIAGNOSIS — Z0000 Encounter for general adult medical examination without abnormal findings: Secondary | ICD-10-CM

## 2019-01-18 DIAGNOSIS — M25542 Pain in joints of left hand: Secondary | ICD-10-CM | POA: Insufficient documentation

## 2019-01-18 LAB — URINALYSIS, ROUTINE W REFLEX MICROSCOPIC
Bilirubin Urine: NEGATIVE
Hgb urine dipstick: NEGATIVE
Ketones, ur: NEGATIVE
Leukocytes,Ua: NEGATIVE
Nitrite: NEGATIVE
RBC / HPF: NONE SEEN (ref 0–?)
Specific Gravity, Urine: 1.025 (ref 1.000–1.030)
Total Protein, Urine: NEGATIVE
Urine Glucose: NEGATIVE
Urobilinogen, UA: 0.2 (ref 0.0–1.0)
pH: 5.5 (ref 5.0–8.0)

## 2019-01-18 LAB — BASIC METABOLIC PANEL
BUN: 15 mg/dL (ref 6–23)
CO2: 23 mEq/L (ref 19–32)
Calcium: 9.2 mg/dL (ref 8.4–10.5)
Chloride: 104 mEq/L (ref 96–112)
Creatinine, Ser: 0.74 mg/dL (ref 0.40–1.20)
GFR: 78.06 mL/min (ref >60.00)
Glucose, Bld: 103 mg/dL — ABNORMAL HIGH (ref 70–99)
Potassium: 3.9 mEq/L (ref 3.5–5.1)
Sodium: 138 mEq/L (ref 135–145)

## 2019-01-18 LAB — MICROALBUMIN / CREATININE URINE RATIO
Creatinine,U: 113.1 mg/dL
Microalb Creat Ratio: 0.6 mg/g (ref 0.0–30.0)
Microalb, Ur: <0.7 mg/dL (ref 0.0–1.9)

## 2019-01-18 LAB — SEDIMENTATION RATE: Sed Rate: 18 mm/hr (ref 0–30)

## 2019-01-18 NOTE — Progress Notes (Signed)
Established Patient Office Visit  Subjective:  Patient ID: Michele Hanson, female    DOB: 1950/11/11  Age: 68 y.o. MRN: [id number]  CC:  Chief Complaint  Patient presents with  . Annual Exam    HPI Michele Hanson presents for further discussion about her multiple joint aches and pains.  Patient has noticed some increased in the pain and morning stiffness of both of her wrists, MCPs, PIPs and DIPs of both hands.  She has had problems with both shoulders.  She has had trauma to the right shoulder involving a fracture.  She has needed steroid injections into the left shoulder.  She is right-hand dominant.  She has no history of psoriasis.  No family history of inflammatory arthritis.  She has not been losing weight for him during night sweats.  She has experienced hip pain that she believes is associated with her chronic lower back pain.  She has longstanding history of lumbar radiculopathies with spinal stenosis.  Surgical correction has been proposed for this problem.  She has had some pain in her MTPs as well.  Female health is through her GYN doctor.  She will start her pneumonia vaccine series at her next clinic visit.  We discussed the decline in her GFR with last blood check.  Her blood pressure has been well controlled on her current regimen.  She has no prior history of renal disease.  Recheck her BMP today.  We will follow this for now.  She is on lisinopril and HCTZ for blood pressure control.  Past Medical History:  Diagnosis Date  . GERD (gastroesophageal reflux disease)   . Hyperlipemia   . Hypertension     Past Surgical History:  Procedure Laterality Date  . CHOLECYSTECTOMY      Family History  Family history unknown: Yes    Social History   Socioeconomic History  . Marital status: Married    Spouse name: Not on file  . Number of children: Not on file  . Years of education: Not on file  . Highest education level: Not on file  Occupational History  . Not on file   Social Needs  . Financial resource strain: Not on file  . Food insecurity    Worry: Not on file    Inability: Not on file  . Transportation needs    Medical: Not on file    Non-medical: Not on file  Tobacco Use  . Smoking status: Never Smoker  . Smokeless tobacco: Never Used  Substance and Sexual Activity  . Alcohol use: Yes    Comment: 1-2 alcoholic drinks daily  . Drug use: Not on file  . Sexual activity: Not on file  Lifestyle  . Physical activity    Days per week: Not on file    Minutes per session: Not on file  . Stress: Not on file  Relationships  . Social Herbalist on phone: Not on file    Gets together: Not on file    Attends religious service: Not on file    Active member of club or organization: Not on file    Attends meetings of clubs or organizations: Not on file    Relationship status: Not on file  . Intimate partner violence    Fear of current or ex partner: Not on file    Emotionally abused: Not on file    Physically abused: Not on file    Forced sexual activity: Not on file  Other Topics Concern  .  Not on file  Social History Narrative  . Not on file    Outpatient Medications Prior to Visit  Medication Sig Dispense Refill  . atorvastatin (LIPITOR) 20 MG tablet Take 1 tablet (20 mg total) by mouth daily. 90 tablet 3  . Azelastine HCl 137 MCG/SPRAY SOLN Place 1 spray into both nostrils as needed. 1 Bottle 6  . citalopram (CELEXA) 20 MG tablet Take 1 tablet (20 mg total) by mouth daily. 90 tablet 1  . gabapentin (NEURONTIN) 300 MG capsule Take 1 capsule (300 mg total) by mouth 2 (two) times daily. 180 capsule 1  . HYDROcodone-acetaminophen (NORCO/VICODIN) 5-325 MG tablet As needed    . lisinopril-hydrochlorothiazide (ZESTORETIC) 20-12.5 MG tablet Take 1 tablet by mouth daily. 90 tablet 1  . methocarbamol (ROBAXIN) 500 MG tablet TAKE 1 TABLET BY MOUTH EVERY 8 HOURS AS NEEDED FOR MUSCLE SPASMS 60 tablet 0  . omeprazole (PRILOSEC) 20 MG capsule  TAKE 1 CAPSULE(20 MG) BY MOUTH DAILY 90 capsule 1  . vitamin B-12 (CYANOCOBALAMIN) 500 MCG tablet TAKE 1 TABLET BY MOUTH EVERY DAY 30 tablet 5  . fluticasone (FLONASE) 50 MCG/ACT nasal spray Place 1 spray into both nostrils daily. 16 g 5   No facility-administered medications prior to visit.     Allergies  Allergen Reactions  . Ciprofloxacin Other (See Comments)    Neuropathy in feet  . Neomycin   . Nickel Itching    ROS Review of Systems  Constitutional: Negative.   HENT: Negative.   Respiratory: Negative.   Cardiovascular: Negative.   Gastrointestinal: Negative.   Endocrine: Negative for polyphagia and polyuria.  Genitourinary: Negative for decreased urine volume, difficulty urinating and frequency.  Musculoskeletal: Positive for arthralgias, back pain and joint swelling.  Skin: Negative for pallor and rash.  Allergic/Immunologic: Negative for immunocompromised state.  Neurological: Negative for weakness.  Hematological: Does not bruise/bleed easily.  Psychiatric/Behavioral: Negative.       Objective:    Physical Exam  Constitutional: She is oriented to person, place, and time. She appears well-developed and well-nourished. No distress.  HENT:  Head: Normocephalic and atraumatic.  Right Ear: External ear normal.  Left Ear: External ear normal.  Eyes: Conjunctivae are normal. Right eye exhibits no discharge. Left eye exhibits no discharge. No scleral icterus.  Neck: No JVD present. No tracheal deviation present.  Pulmonary/Chest: Effort normal. No stridor.  Musculoskeletal:        General: No edema.     Right wrist: She exhibits normal range of motion, no tenderness and no bony tenderness.     Left wrist: She exhibits normal range of motion, no tenderness and no bony tenderness.     Right hip: She exhibits normal range of motion, normal strength and no tenderness.     Left hip: She exhibits normal range of motion, normal strength and no tenderness.       Hands:        Feet:  Neurological: She is alert and oriented to person, place, and time.  Skin: Skin is warm and dry. She is not diaphoretic.  Psychiatric: She has a normal mood and affect. Her behavior is normal.    BP 126/80   Pulse 81   Ht 5\' 6"  (1.676 m)   Wt 164 lb (74.4 kg)   SpO2 98%   BMI 26.47 kg/m  Wt Readings from Last 3 Encounters:  01/18/19 164 lb (74.4 kg)  03/16/18 161 lb (73 kg)  07/15/17 162 lb 6 oz (73.7 kg)  BP Readings from Last 3 Encounters:  01/18/19 126/80  03/16/18 124/80  07/15/17 110/80   Guideline developer:  UpToDate (see UpToDate for funding source) Date Released: June 2014  Health Maintenance Due  Topic Date Due  . Hepatitis C Screening  22-Jun-1950  . TETANUS/TDAP  02/04/1970  . MAMMOGRAM  02/04/2001  . PNA vac Low Risk Adult (1 of 2 - PCV13) 02/05/2016  . INFLUENZA VACCINE  11/13/2018    There are no preventive care reminders to display for this patient.  Lab Results  Component Value Date   TSH 1.37 07/15/2017   Lab Results  Component Value Date   WBC 5.0 09/24/2018   HGB 13.4 09/24/2018   HCT 40.3 09/24/2018   MCV 93.0 09/24/2018   PLT 186.0 09/24/2018   Lab Results  Component Value Date   NA 140 09/24/2018   K 4.4 09/24/2018   CHLORIDE 104 08/09/2013   CO2 24 09/24/2018   GLUCOSE 99 09/24/2018   BUN 18 09/24/2018   CREATININE 0.94 09/24/2018   BILITOT 0.4 09/24/2018   ALKPHOS 65 09/24/2018   AST 15 09/24/2018   ALT 14 09/24/2018   PROT 6.6 09/24/2018   ALBUMIN 4.3 09/24/2018   CALCIUM 9.0 09/24/2018   ANIONGAP 11 08/09/2013   GFR 59.28 (L) 09/24/2018   Lab Results  Component Value Date   CHOL 187 09/24/2018   Lab Results  Component Value Date   HDL 75.70 09/24/2018   Lab Results  Component Value Date   LDLCALC 86 09/24/2018   Lab Results  Component Value Date   TRIG 123.0 09/24/2018   Lab Results  Component Value Date   CHOLHDL 2 09/24/2018   No results found for: HGBA1C    Assessment & Plan:    Problem List Items Addressed This Visit      Other   Healthcare maintenance - Primary   Relevant Orders   Hepatitis C antibody   Joint pain in both hands   Relevant Orders   Sedimentation rate   Rheumatoid Factor   Hepatitis C antibody   Need for influenza vaccination   Relevant Orders   Flu Vaccine QUAD High Dose(Fluad) (Completed)    Other Visit Diagnoses    Decreased renal function       Relevant Orders   Basic metabolic panel   Hepatitis C antibody   Urinalysis, Routine w reflex microscopic   Microalbumin / creatinine urine ratio      No orders of the defined types were placed in this encounter.   Follow-up: Return in about 8 weeks (around 03/17/2019).

## 2019-01-21 ENCOUNTER — Ambulatory Visit (INDEPENDENT_AMBULATORY_CARE_PROVIDER_SITE_OTHER): Payer: Medicare Other | Admitting: Family Medicine

## 2019-01-21 ENCOUNTER — Other Ambulatory Visit: Payer: Self-pay

## 2019-01-21 ENCOUNTER — Encounter: Payer: Self-pay | Admitting: Family Medicine

## 2019-01-21 DIAGNOSIS — Z8619 Personal history of other infectious and parasitic diseases: Secondary | ICD-10-CM

## 2019-01-21 DIAGNOSIS — M48062 Spinal stenosis, lumbar region with neurogenic claudication: Secondary | ICD-10-CM

## 2019-01-21 DIAGNOSIS — M25541 Pain in joints of right hand: Secondary | ICD-10-CM | POA: Diagnosis not present

## 2019-01-21 DIAGNOSIS — M25511 Pain in right shoulder: Secondary | ICD-10-CM

## 2019-01-21 DIAGNOSIS — M25542 Pain in joints of left hand: Secondary | ICD-10-CM

## 2019-01-21 DIAGNOSIS — G8929 Other chronic pain: Secondary | ICD-10-CM | POA: Insufficient documentation

## 2019-01-21 MED ORDER — MELOXICAM 15 MG PO TABS: 15.0000 mg | ORAL_TABLET | Freq: Every day | ORAL | 1 refills | Status: DC

## 2019-01-21 MED ORDER — TRAMADOL HCL 50 MG PO TABS: 50.0000 mg | ORAL_TABLET | Freq: Three times a day (TID) | ORAL | 0 refills | Status: DC | PRN

## 2019-01-21 NOTE — Progress Notes (Signed)
Established Patient Office Visit  Subjective:  Patient ID: Michele Hanson, female    DOB: [birth year]  Age: 68 y.o. MRN: [id number]  CC:  Chief Complaint  Patient presents with  . Follow-up    labs    HPI Michele Hanson presents for follow-up of her multiple joint aches and pains including her hands shoulder and lower back.  The right shoulder has been bothering her the most recently it woke her up from sleep.  Laboratory work-up did show her to be positive for hep C.  This was not listed in her medical record.  Patient was aware of this and had been treated with interferon many years ago.  She believes that she picked it up after receiving a blood transfusion in 1982.  She has not had a ultrasound of her liver.  She believes it previous tests for active hep C have been negative.  Past Medical History:  Diagnosis Date  . GERD (gastroesophageal reflux disease)   . Hyperlipemia   . Hypertension     Past Surgical History:  Procedure Laterality Date  . CHOLECYSTECTOMY      Family History  Family history unknown: Yes    Social History   Socioeconomic History  . Marital status: Married    Spouse name: Not on file  . Number of children: Not on file  . Years of education: Not on file  . Highest education level: Not on file  Occupational History  . Not on file  Social Needs  . Financial resource strain: Not on file  . Food insecurity    Worry: Not on file    Inability: Not on file  . Transportation needs    Medical: Not on file    Non-medical: Not on file  Tobacco Use  . Smoking status: Never Smoker  . Smokeless tobacco: Never Used  Substance and Sexual Activity  . Alcohol use: Yes    Comment: 1-2 alcoholic drinks daily  . Drug use: Not on file  . Sexual activity: Not on file  Lifestyle  . Physical activity    Days per week: Not on file    Minutes per session: Not on file  . Stress: Not on file  Relationships  . Social Herbalist on phone: Not on  file    Gets together: Not on file    Attends religious service: Not on file    Active member of club or organization: Not on file    Attends meetings of clubs or organizations: Not on file    Relationship status: Not on file  . Intimate partner violence    Fear of current or ex partner: Not on file    Emotionally abused: Not on file    Physically abused: Not on file    Forced sexual activity: Not on file  Other Topics Concern  . Not on file  Social History Narrative  . Not on file    Outpatient Medications Prior to Visit  Medication Sig Dispense Refill  . atorvastatin (LIPITOR) 20 MG tablet Take 1 tablet (20 mg total) by mouth daily. 90 tablet 3  . Azelastine HCl 137 MCG/SPRAY SOLN Place 1 spray into both nostrils as needed. 1 Bottle 6  . citalopram (CELEXA) 20 MG tablet Take 1 tablet (20 mg total) by mouth daily. 90 tablet 1  . gabapentin (NEURONTIN) 300 MG capsule Take 1 capsule (300 mg total) by mouth 2 (two) times daily. 180 capsule 1  . HYDROcodone-acetaminophen (NORCO/VICODIN) 5-325  MG tablet As needed    . lisinopril-hydrochlorothiazide (ZESTORETIC) 20-12.5 MG tablet Take 1 tablet by mouth daily. 90 tablet 1  . methocarbamol (ROBAXIN) 500 MG tablet TAKE 1 TABLET BY MOUTH EVERY 8 HOURS AS NEEDED FOR MUSCLE SPASMS 60 tablet 0  . omeprazole (PRILOSEC) 20 MG capsule TAKE 1 CAPSULE(20 MG) BY MOUTH DAILY 90 capsule 1  . vitamin B-12 (CYANOCOBALAMIN) 500 MCG tablet TAKE 1 TABLET BY MOUTH EVERY DAY 30 tablet 5  . fluticasone (FLONASE) 50 MCG/ACT nasal spray Place 1 spray into both nostrils daily. 16 g 5   No facility-administered medications prior to visit.     Allergies  Allergen Reactions  . Ciprofloxacin Other (See Comments)    Neuropathy in feet  . Neomycin   . Nickel Itching    ROS Review of Systems    Objective:    Physical Exam  There were no vitals taken for this visit. Wt Readings from Last 3 Encounters:  01/18/19 164 lb (74.4 kg)  03/16/18 161 lb (73 kg)   07/15/17 162 lb 6 oz (73.7 kg)   BP Readings from Last 3 Encounters:  01/18/19 126/80  03/16/18 124/80  07/15/17 110/80   Guideline developer:  UpToDate (see UpToDate for funding source) Date Released: June 2014  Health Maintenance Due  Topic Date Due  . Samul Dada  02/04/1970  . MAMMOGRAM  02/04/2001  . PNA vac Low Risk Adult (1 of 2 - PCV13) 02/05/2016    There are no preventive care reminders to display for this patient.  Lab Results  Component Value Date   TSH 1.37 07/15/2017  Noise was Lab Results  Component Value Date   WBC 5.0 09/24/2018   HGB 13.4 09/24/2018   HCT 40.3 09/24/2018   MCV 93.0 09/24/2018   PLT 186.0 09/24/2018   Lab Results  Component Value Date   NA 138 01/18/2019   K 3.9 01/18/2019   CHLORIDE 104 08/09/2013   CO2 23 01/18/2019   GLUCOSE 103 (H) 01/18/2019   BUN 15 01/18/2019   CREATININE 0.74 01/18/2019   BILITOT 0.4 09/24/2018   ALKPHOS 65 09/24/2018   AST 15 09/24/2018   ALT 14 09/24/2018   PROT 6.6 09/24/2018   ALBUMIN 4.3 09/24/2018   CALCIUM 9.2 01/18/2019   ANIONGAP 11 08/09/2013   GFR 78.06 01/18/2019   Lab Results  Component Value Date   CHOL 187 09/24/2018   Lab Results  Component Value Date   HDL 75.70 09/24/2018   Lab Results  Component Value Date   LDLCALC 86 09/24/2018   Lab Results  Component Value Date   TRIG 123.0 09/24/2018   Lab Results  Component Value Date   CHOLHDL 2 09/24/2018   No results found for: HGBA1C    Assessment & Plan:   Problem List Items Addressed This Visit      Digestive   History of hepatitis C - Primary   Relevant Orders   HCV RNA quant   US Abdomen Limited RUQ   HIV Antibody (routine testing w rflx)     Other   Spinal stenosis of lumbar region with neurogenic claudication   Relevant Medications   meloxicam (MOBIC) 15 MG tablet   traMADol (ULTRAM) 50 MG tablet   Joint pain in both hands   Relevant Medications   meloxicam (MOBIC) 15 MG tablet   Chronic right  shoulder pain   Relevant Medications   meloxicam (MOBIC) 15 MG tablet   traMADol (ULTRAM) 50 MG tablet   Other  Relevant Orders   Ambulatory referral to Sports Medicine      Meds ordered this encounter  Medications  . meloxicam (MOBIC) 15 MG tablet    Sig: Take 1 tablet (15 mg total) by mouth daily. Take for 10 days and then as needed.    Dispense:  30 tablet    Refill:  1  . traMADol (ULTRAM) 50 MG tablet    Sig: Take 1 tablet (50 mg total) by mouth every 8 (eight) hours as needed for up to 5 days for severe pain.    Dispense:  30 tablet    Refill:  0    Follow-up: No follow-ups on file.   Patient has scheduled follow-up in December.  Virtual Visit via Video Note  I connected with Michele Hanson on 01/21/19 at  1:30 PM EDT by a video enabled telemedicine application and verified that I am speaking with the correct person using two identifiers.  Location: Patient: home Provider:    I discussed the limitations of evaluation and management by telemedicine and the availability of in person appointments. The patient expressed understanding and agreed to proceed.  History of Present Illness:    Observations/Objective:   Assessment and Plan:   Follow Up Instructions:    I discussed the assessment and treatment plan with the patient. The patient was provided an opportunity to ask questions and all were answered. The patient agreed with the plan and demonstrated an understanding of the instructions.   The patient was advised to call back or seek an in-person evaluation if the symptoms worsen or if the condition fails to improve as anticipated.  I provided 25 minutes of non-face-to-face time during this encounter.   Libby Maw, MD

## 2019-01-23 LAB — HEPATITIS C ANTIBODY
Hepatitis C Ab: REACTIVE — AB
SIGNAL TO CUT-OFF: 17.4 — ABNORMAL HIGH (ref <1.00)

## 2019-01-23 LAB — HCV RNA,QUANTITATIVE REAL TIME PCR
HCV Quantitative Log: <1.18 Log IU/mL
HCV RNA, PCR, QN: <15 IU/mL

## 2019-01-23 LAB — RHEUMATOID FACTOR: Rheumatoid fact SerPl-aCnc: <14 IU/mL (ref <14)

## 2019-01-25 NOTE — Progress Notes (Signed)
Virtual Visit via Video Note  I connected with patient on 01/26/19 at  8:45 AM EDT by audio enabled telemedicine application and verified that I am speaking with the correct person using two identifiers.   THIS ENCOUNTER IS A VIRTUAL VISIT DUE TO COVID-19 - PATIENT WAS NOT SEEN IN THE OFFICE. PATIENT HAS CONSENTED TO VIRTUAL VISIT / TELEMEDICINE VISIT   Location of patient: home  Location of provider: office  I discussed the limitations of evaluation and management by telemedicine and the availability of in person appointments. The patient expressed understanding and agreed to proceed.   Subjective:   Michele Hanson is a 68 y.o. female who presents for an Initial Medicare Annual Wellness Visit.  Review of Systems    Home Safety/Smoke Alarms: Feels safe in home. Smoke alarms in place.   Lives with husband and 3 dogs in 2 story home.   Female:       Mammo-  Pt reports scheduled with GYN in 03/2019     Dexa scan- 07/22/17       CCS- 02/17/13.      Objective:     Advanced Directives 01/26/2019  Does Patient Have a Medical Advance Directive? No  Would patient like information on creating a medical advance directive? No - Patient declined    Current Medications (verified) Outpatient Encounter Medications as of 01/26/2019  Medication Sig  . atorvastatin (LIPITOR) 20 MG tablet Take 1 tablet (20 mg total) by mouth daily.  . Azelastine HCl 137 MCG/SPRAY SOLN Place 1 spray into both nostrils as needed.  . citalopram (CELEXA) 20 MG tablet Take 1 tablet (20 mg total) by mouth daily.  Marland Kitchen gabapentin (NEURONTIN) 300 MG capsule Take 1 capsule (300 mg total) by mouth 2 (two) times daily.  Marland Kitchen HYDROcodone-acetaminophen (NORCO/VICODIN) 5-325 MG tablet As needed  . lisinopril-hydrochlorothiazide (ZESTORETIC) 20-12.5 MG tablet Take 1 tablet by mouth daily.  . meloxicam (MOBIC) 15 MG tablet Take 1 tablet (15 mg total) by mouth daily. Take for 10 days and then as needed.  . methocarbamol (ROBAXIN)  500 MG tablet TAKE 1 TABLET BY MOUTH EVERY 8 HOURS AS NEEDED FOR MUSCLE SPASMS  . omeprazole (PRILOSEC) 20 MG capsule TAKE 1 CAPSULE(20 MG) BY MOUTH DAILY  . traMADol (ULTRAM) 50 MG tablet Take 1 tablet (50 mg total) by mouth every 8 (eight) hours as needed for up to 5 days for severe pain.  . vitamin B-12 (CYANOCOBALAMIN) 500 MCG tablet TAKE 1 TABLET BY MOUTH EVERY DAY  . fluticasone (FLONASE) 50 MCG/ACT nasal spray Place 1 spray into both nostrils daily.   No facility-administered encounter medications on file as of 01/26/2019.     Allergies (verified) Ciprofloxacin, Neomycin, and Nickel   History: Past Medical History:  Diagnosis Date  . GERD (gastroesophageal reflux disease)   . Hyperlipemia   . Hypertension    Past Surgical History:  Procedure Laterality Date  . CHOLECYSTECTOMY     Family History  Family history unknown: Yes   Social History   Socioeconomic History  . Marital status: Married    Spouse name: Not on file  . Number of children: Not on file  . Years of education: Not on file  . Highest education level: Not on file  Occupational History  . Not on file  Social Needs  . Financial resource strain: Not on file  . Food insecurity    Worry: Not on file    Inability: Not on file  . Transportation needs    Medical:  Not on file    Non-medical: Not on file  Tobacco Use  . Smoking status: Never Smoker  . Smokeless tobacco: Never Used  Substance and Sexual Activity  . Alcohol use: Yes    Comment: 1-2 alcoholic drinks daily  . Drug use: Never  . Sexual activity: Not on file  Lifestyle  . Physical activity    Days per week: Not on file    Minutes per session: Not on file  . Stress: Not on file  Relationships  . Social Herbalist on phone: Not on file    Gets together: Not on file    Attends religious service: Not on file    Active member of club or organization: Not on file    Attends meetings of clubs or organizations: Not on file     Relationship status: Not on file  Other Topics Concern  . Not on file  Social History Narrative  . Not on file    Tobacco Counseling Counseling given: Not Answered   Clinical Intake: Pain : No/denies pain    Activities of Daily Living In your present state of health, do you have any difficulty performing the following activities: 01/26/2019  Hearing? N  Vision? N  Difficulty concentrating or making decisions? N  Walking or climbing stairs? N  Dressing or bathing? N  Doing errands, shopping? N  Preparing Food and eating ? N  Using the Toilet? N  In the past six months, have you accidently leaked urine? N  Do you have problems with loss of bowel control? N  Managing your Medications? N  Managing your Finances? N  Housekeeping or managing your Housekeeping? N  Some recent data might be hidden     Immunizations and Health Maintenance Immunization History  Administered Date(s) Administered  . Fluad Quad(high Dose 65+) 01/18/2019  . Influenza, High Dose Seasonal PF 03/16/2018   Health Maintenance Due  Topic Date Due  . TETANUS/TDAP  02/04/1970  . MAMMOGRAM  02/04/2001  . PNA vac Low Risk Adult (1 of 2 - PCV13) 02/05/2016    Patient Care Team: Libby Maw, MD as PCP - General (Family Medicine)  Indicate any recent Medical Services you may have received from other than [facility] providers in the past year (date may be approximate).     Assessment:   This is a routine wellness examination for Eilah.Physical assessment deferred to PCP.  Hearing/Vision screen Unable to assess. This visit is enabled though telemedicine due to Covid 19.  Dietary issues and exercise activities discussed: Current Exercise Habits: Home exercise routine, Type of exercise: walking, Time (Minutes): 45, Frequency (Times/Week): 4, Weekly Exercise (Minutes/Week): 180, Intensity: Mild, Exercise limited by: None identified Diet (meal preparation, eat out, water intake, caffeinated  beverages, dairy products, fruits and vegetables): 24  Hr recall Breakfast: [place] sandwich  Lunch: salad Dinner:  White chicken chili   Goals    . Patient Stated     Maintain healthy active lifestyle.       Depression Screen PHQ 2/9 Scores 01/26/2019  PHQ - 2 Score 0    Fall Risk Fall Risk  01/26/2019  Falls in the past year? 1  Number falls in past yr: 1  Injury with Fall? 0    Cognitive Function: Ad8 score reviewed for issues:  Issues making decisions:no  Less interest in hobbies / activities:no  Repeats questions, stories (family complaining):no  Trouble using ordinary gadgets (microwave, computer, phone):no  Forgets the month or year: no  Mismanaging finances: no  Remembering appts:no  Daily problems with thinking and/or memory:no Ad8 score is=0         Screening Tests Health Maintenance  Topic Date Due  . TETANUS/TDAP  02/04/1970  . MAMMOGRAM  02/04/2001  . PNA vac Low Risk Adult (1 of 2 - PCV13) 02/05/2016  . COLONOSCOPY  02/18/2023  . INFLUENZA VACCINE  Completed  . DEXA SCAN  Completed  . Hepatitis C Screening  Completed       Plan:    Please schedule your next medicare wellness visit with me in 1 yr.  Continue to eat heart healthy diet (full of fruits, vegetables, whole grains, lean protein, water--limit salt, fat, and sugar intake) and increase physical activity as tolerated.  Continue doing brain stimulating activities (puzzles, reading, adult coloring books, staying active) to keep memory sharp.     I have personally reviewed and noted the following in the patient's chart:   . Medical and social history . Use of alcohol, tobacco or illicit drugs  . Current medications and supplements . Functional ability and status . Nutritional status . Physical activity . Advanced directives . List of other physicians . Hospitalizations, surgeries, and ER visits in previous 12 months . Vitals . Screenings to include cognitive, depression,  and falls . Referrals and appointments  In addition, I have reviewed and discussed with patient certain preventive protocols, quality metrics, and best practice recommendations. A written personalized care plan for preventive services as well as general preventive health recommendations were provided to patient.     Shela Nevin, [place]   01/26/2019

## 2019-01-26 ENCOUNTER — Ambulatory Visit (INDEPENDENT_AMBULATORY_CARE_PROVIDER_SITE_OTHER): Payer: Medicare Other | Admitting: *Deleted

## 2019-01-26 ENCOUNTER — Encounter: Payer: Self-pay | Admitting: *Deleted

## 2019-01-26 DIAGNOSIS — Z0000 Encounter for general adult medical examination without abnormal findings: Secondary | ICD-10-CM

## 2019-01-26 NOTE — Patient Instructions (Addendum)
Please schedule your next medicare wellness visit with me in 1 yr.  Continue to eat heart healthy diet (full of fruits, vegetables, whole grains, lean protein, water--limit salt, fat, and sugar intake) and increase physical activity as tolerated.  Continue doing brain stimulating activities (puzzles, reading, adult coloring books, staying active) to keep memory sharp.    [name] , Thank you for taking time to come for your Medicare Wellness Visit. I appreciate your ongoing commitment to your health goals. Please review the following plan we discussed and let me know if I can assist you in the future.   These are the goals we discussed: Goals    . Patient Stated     Maintain healthy active lifestyle.        This is a list of the screening recommended for you and due dates:  Health Maintenance  Topic Date Due  . Tetanus Vaccine  02/04/1970  . Mammogram  02/04/2001  . Pneumonia vaccines (1 of 2 - PCV13) 02/05/2016  . Colon Cancer Screening  02/18/2023  . Flu Shot  Completed  . DEXA scan (bone density measurement)  Completed  .  Hepatitis C: One time screening is recommended by Center for Disease Control  (CDC) for  adults born from [birth year] through 1965.   Completed    Health Maintenance After Age 61 After age 53, you are at a higher risk for certain long-term diseases and infections as well as injuries from falls. Falls are a major cause of broken bones and head injuries in people who are older than age 66. Getting regular preventive care can help to keep you healthy and well. Preventive care includes getting regular testing and making lifestyle changes as recommended by your health care provider. Talk with your health care provider about:  Which screenings and tests you should have. A screening is a test that checks for a disease when you have no symptoms.  A diet and exercise plan that is right for you. What should I know about screenings and tests to prevent falls? Screening and  testing are the best ways to find a health problem early. Early diagnosis and treatment give you the best chance of managing medical conditions that are common after age 54. Certain conditions and lifestyle choices may make you more likely to have a fall. Your health care provider may recommend:  Regular vision checks. Poor vision and conditions such as cataracts can make you more likely to have a fall. If you wear glasses, make sure to get your prescription updated if your vision changes.  Medicine review. Work with your health care provider to regularly review all of the medicines you are taking, including over-the-counter medicines. Ask your health care provider about any side effects that may make you more likely to have a fall. Tell your health care provider if any medicines that you take make you feel dizzy or sleepy.  Osteoporosis screening. Osteoporosis is a condition that causes the bones to get weaker. This can make the bones weak and cause them to break more easily.  Blood pressure screening. Blood pressure changes and medicines to control blood pressure can make you feel dizzy.  Strength and balance checks. Your health care provider may recommend certain tests to check your strength and balance while standing, walking, or changing positions.  Foot health exam. Foot pain and numbness, as well as not wearing proper footwear, can make you more likely to have a fall.  Depression screening. You may be more likely  to have a fall if you have a fear of falling, feel emotionally low, or feel unable to do activities that you used to do.  Alcohol use screening. Using too much alcohol can affect your balance and may make you more likely to have a fall. What actions can I take to lower my risk of falls? General instructions  Talk with your health care provider about your risks for falling. Tell your health care provider if: ? You fall. Be sure to tell your health care provider about all falls,  even ones that seem minor. ? You feel dizzy, sleepy, or off-balance.  Take over-the-counter and prescription medicines only as told by your health care provider. These include any supplements.  Eat a healthy diet and maintain a healthy weight. A healthy diet includes low-fat dairy products, low-fat (lean) meats, and fiber from whole grains, beans, and lots of fruits and vegetables. Home safety  Remove any tripping hazards, such as rugs, cords, and clutter.  Install safety equipment such as grab bars in bathrooms and safety rails on stairs.  Keep rooms and walkways well-lit. Activity   Follow a regular exercise program to stay fit. This will help you maintain your balance. Ask your health care provider what types of exercise are appropriate for you.  If you need a cane or walker, use it as recommended by your health care provider.  Wear supportive shoes that have nonskid soles. Lifestyle  Do not drink alcohol if your health care provider tells you not to drink.  If you drink alcohol, limit how much you have: ? 0-1 drink a day for women. ? 0-2 drinks a day for men.  Be aware of how much alcohol is in your drink. In the [place] one drink equals one typical bottle of beer (12 oz), one-half glass of wine (5 oz), or one shot of hard liquor (1 oz).  Do not use any products that contain nicotine or tobacco, such as cigarettes and e-cigarettes. If you need help quitting, ask your health care provider. Summary  Having a healthy lifestyle and getting preventive care can help to protect your health and wellness after age 52.  Screening and testing are the best way to find a health problem early and help you avoid having a fall. Early diagnosis and treatment give you the best chance for managing medical conditions that are more common for people who are older than age 91.  Falls are a major cause of broken bones and head injuries in people who are older than age 58. Take precautions to  prevent a fall at home.  Work with your health care provider to learn what changes you can make to improve your health and wellness and to prevent falls. This information is not intended to replace advice given to you by your health care provider. Make sure you discuss any questions you have with your health care provider. Document Released: 02/11/2017 Document Revised: 07/22/2018 Document Reviewed: 02/11/2017 Elsevier Patient Education  2020 [employer].

## 2019-01-28 ENCOUNTER — Other Ambulatory Visit: Payer: Self-pay

## 2019-01-28 ENCOUNTER — Ambulatory Visit (INDEPENDENT_AMBULATORY_CARE_PROVIDER_SITE_OTHER): Payer: Medicare Other | Admitting: Family Medicine

## 2019-01-28 ENCOUNTER — Ambulatory Visit: Payer: Self-pay

## 2019-01-28 ENCOUNTER — Encounter: Payer: Self-pay | Admitting: Family Medicine

## 2019-01-28 VITALS — BP 140/89 | HR 72 | Ht 66.0 in | Wt 163.0 lb

## 2019-01-28 DIAGNOSIS — M7582 Other shoulder lesions, left shoulder: Secondary | ICD-10-CM | POA: Diagnosis not present

## 2019-01-28 DIAGNOSIS — G8929 Other chronic pain: Secondary | ICD-10-CM

## 2019-01-28 DIAGNOSIS — M778 Other enthesopathies, not elsewhere classified: Secondary | ICD-10-CM

## 2019-01-28 MED ORDER — NITROGLYCERIN 0.2 MG/HR TD PT24: MEDICATED_PATCH | TRANSDERMAL | 11 refills | Status: DC

## 2019-01-28 NOTE — Patient Instructions (Addendum)
Nice to meet you Please try the exercises  Please try voltaren over the counter  Please try tylenol as needed   Please send me a message in [place] with any questions or updates.  Please see me back in 4 weeks.   --Dr. Raeford Razor  Nitroglycerin Protocol   Apply 1/4 nitroglycerin patch to affected area daily.  Change position of patch within the affected area every 24 hours.  You may experience a headache during the first 1-2 weeks of using the patch, these should subside.  If you experience headaches after beginning nitroglycerin patch treatment, you may take your preferred over the counter pain reliever.  Another side effect of the nitroglycerin patch is skin irritation or rash related to patch adhesive.  Please notify our office if you develop more severe headaches or rash, and stop the patch.  Tendon healing with nitroglycerin patch may require 12 to 24 weeks depending on the extent of injury.  Men should not use if taking Viagra, Cialis, or Levitra.   Do not use if you have migraines or rosacea.

## 2019-01-28 NOTE — Assessment & Plan Note (Signed)
Findings on the left appear to be more rotator cuff and bursitis.  Had an injection a few weeks ago but pain is returned. -Initiate nitroglycerin patches. - Counseled on home exercise therapy and supportive care. - Could consider subacromial injection and physical therapy.

## 2019-01-28 NOTE — Progress Notes (Signed)
Michele Hanson - 68 y.o. female MRN [id number]  Date of birth: 09/01/1950  SUBJECTIVE:  Including CC & ROS.  Chief Complaint  Patient presents with  . Shoulder Pain    bilateral shoulder    Michele Hanson is a 68 y.o. female that is presenting with bilateral shoulder pain.  The pain is acute on chronic in nature.  The left pain has been worse as of late.  She noticed the pain after performing an aerobics class further doing different shoulder exercises.  She did receive an injection a few weeks ago that did improve her symptoms.  Has been through physical therapy previously.  The pain seems to be localized to the shoulder.  She denies any loss of range of motion the left shoulder.  Pain is mild to moderate in nature.  It is intermittent.  The right shoulder is acute on chronic as well.  She had a history of a humeral fracture in 2013.  Since that time she has not had the same range of motion.  She lacks abduction and flexion actively.  Has been through physical therapy for this.  The pain is intermittent in nature.  Is mild to moderate.  It is throbbing and achy.  Denies any previous injections.   Review of Systems  Constitutional: Negative for fever.  HENT: Negative for congestion.   Respiratory: Negative for cough.   Cardiovascular: Negative for chest pain.  Gastrointestinal: Negative for abdominal pain.  Musculoskeletal: Positive for arthralgias and joint swelling.  Skin: Negative for color change.  Neurological: Negative for weakness.  Hematological: Negative for adenopathy.    HISTORY: Past Medical, Surgical, Social, and Family History Reviewed & Updated per EMR.   Pertinent Historical Findings include:  Past Medical History:  Diagnosis Date  . GERD (gastroesophageal reflux disease)   . Hyperlipemia   . Hypertension     Past Surgical History:  Procedure Laterality Date  . CHOLECYSTECTOMY      Allergies  Allergen Reactions  . Ciprofloxacin Other (See Comments)   Neuropathy in feet  . Neomycin   . Nickel Itching    Family History  Family history unknown: Yes     Social History   Socioeconomic History  . Marital status: Married    Spouse name: Not on file  . Number of children: Not on file  . Years of education: Not on file  . Highest education level: Not on file  Occupational History  . Not on file  Social Needs  . Financial resource strain: Not on file  . Food insecurity    Worry: Not on file    Inability: Not on file  . Transportation needs    Medical: Not on file    Non-medical: Not on file  Tobacco Use  . Smoking status: Never Smoker  . Smokeless tobacco: Never Used  Substance and Sexual Activity  . Alcohol use: Yes    Comment: 1-2 alcoholic drinks daily  . Drug use: Never  . Sexual activity: Not on file  Lifestyle  . Physical activity    Days per week: Not on file    Minutes per session: Not on file  . Stress: Not on file  Relationships  . Social Herbalist on phone: Not on file    Gets together: Not on file    Attends religious service: Not on file    Active member of club or organization: Not on file    Attends meetings of clubs or organizations: Not on  file    Relationship status: Not on file  . Intimate partner violence    Fear of current or ex partner: Not on file    Emotionally abused: Not on file    Physically abused: Not on file    Forced sexual activity: Not on file  Other Topics Concern  . Not on file  Social History Narrative  . Not on file     PHYSICAL EXAM:  VS: BP 140/89   Pulse 72   Ht 5\' 6"  (1.676 m)   Wt 163 lb (73.9 kg)   BMI 26.31 kg/m  Physical Exam Gen: NAD, alert, cooperative with exam, well-appearing ENT: normal lips, normal nasal mucosa,  Eye: normal EOM, normal conjunctiva and lids CV:  no edema, +2 pedal pulses   Resp: no accessory muscle use, non-labored,  Skin: no rashes, no areas of induration  Neuro: normal tone, normal sensation to touch Psych:  normal  insight, alert and oriented MSK:  Left shoulder: Normal active flexion and abduction. Normal internal and external rotation. Normal strength resistance. Pain with internal rotation and abduction. Mild pain with empty can testing. Negative Hawkins test. Negative O'Brien's test. Right shoulder: Limited active and passive flexion and abduction. Normal internal rotation. Limited external rotation. Pain with external rotation and abduction. Negative empty can testing. Positive O'Brien's test. Neurovascular intact  Limited ultrasound: Left and right shoulder:  Left shoulder: Normal biceps tendon in short and long axis. Normal subscapularis. There is no overlying bursa of the supraspinatus to suggest subacromial bursitis.  There are chronic tendinopathy changes of the supraspinatus.  In dynamic testing there appears to be a separation upon active abduction to suggest a chronic rotator cuff tear. Normal-appearing posterior glenohumeral joint.  Right shoulder: Normal-appearing biceps tendon in long and short axis. Subscapularis is limited based on her range of motion.  Appears normal from limited view. Supraspinatus with articular sided calcification likely related from history of fracture.  Only mildly chronic changes. Large effusion appreciated in the posterior glenohumeral joint.  Summary: Left shoulder with findings suggestive subacromial bursitis and rotator cuff tendinopathy.  Right shoulder with capsulitis  Ultrasound and interpretation by Clearance Coots, MD    ASSESSMENT & PLAN:   Tendinitis of left rotator cuff Findings on the left appear to be more rotator cuff and bursitis.  Had an injection a few weeks ago but pain is returned. -Initiate nitroglycerin patches. - Counseled on home exercise therapy and supportive care. - Could consider subacromial injection and physical therapy.   Capsulitis of right shoulder Symptoms are acute on chronic in nature.  Likely stemming  from her humeral head fracture from 2013.  Unlikely to get the range of motion back at this point.  Does have a large effusion. -Counseled on home exercise therapy and supportive care. -Could consider glenohumeral injection.  May need to consider referral to Ortho for possible manipulation under anesthesia to get the range of motion back.

## 2019-01-28 NOTE — Assessment & Plan Note (Signed)
Symptoms are acute on chronic in nature.  Likely stemming from her humeral head fracture from 2013.  Unlikely to get the range of motion back at this point.  Does have a large effusion. -Counseled on home exercise therapy and supportive care. -Could consider glenohumeral injection.  May need to consider referral to Ortho for possible manipulation under anesthesia to get the range of motion back.

## 2019-02-03 ENCOUNTER — Ambulatory Visit (HOSPITAL_BASED_OUTPATIENT_CLINIC_OR_DEPARTMENT_OTHER)
Admission: RE | Admit: 2019-02-03 | Discharge: 2019-02-03 | Disposition: A | Discharge status: Home / Self Care | Payer: Medicare Other | Source: Ambulatory Visit | Attending: Family Medicine | Admitting: Family Medicine

## 2019-02-03 ENCOUNTER — Other Ambulatory Visit: Payer: Self-pay

## 2019-02-03 DIAGNOSIS — Z8619 Personal history of other infectious and parasitic diseases: Secondary | ICD-10-CM | POA: Diagnosis present

## 2019-02-25 ENCOUNTER — Ambulatory Visit: Payer: Medicare Other | Admitting: Family Medicine

## 2019-02-25 NOTE — Progress Notes (Deleted)
  Michele Hanson - 68 y.o. female MRN [id number]  Date of birth: 12-Aug-1950  SUBJECTIVE:  Including CC & ROS.  No chief complaint on file.   Michele Hanson is a 68 y.o. female that is  ***.  ***   Review of Systems  HISTORY: Past Medical, Surgical, Social, and Family History Reviewed & Updated per EMR.   Pertinent Historical Findings include:  Past Medical History:  Diagnosis Date  . GERD (gastroesophageal reflux disease)   . Hyperlipemia   . Hypertension     Past Surgical History:  Procedure Laterality Date  . CHOLECYSTECTOMY      Allergies  Allergen Reactions  . Ciprofloxacin Other (See Comments)    Neuropathy in feet  . Neomycin   . Nickel Itching    Family History  Family history unknown: Yes     Social History   Socioeconomic History  . Marital status: Married    Spouse name: Not on file  . Number of children: Not on file  . Years of education: Not on file  . Highest education level: Not on file  Occupational History  . Not on file  Social Needs  . Financial resource strain: Not on file  . Food insecurity    Worry: Not on file    Inability: Not on file  . Transportation needs    Medical: Not on file    Non-medical: Not on file  Tobacco Use  . Smoking status: Never Smoker  . Smokeless tobacco: Never Used  Substance and Sexual Activity  . Alcohol use: Yes    Comment: 1-2 alcoholic drinks daily  . Drug use: Never  . Sexual activity: Not on file  Lifestyle  . Physical activity    Days per week: Not on file    Minutes per session: Not on file  . Stress: Not on file  Relationships  . Social Herbalist on phone: Not on file    Gets together: Not on file    Attends religious service: Not on file    Active member of club or organization: Not on file    Attends meetings of clubs or organizations: Not on file    Relationship status: Not on file  . Intimate partner violence    Fear of current or ex partner: Not on file    Emotionally  abused: Not on file    Physically abused: Not on file    Forced sexual activity: Not on file  Other Topics Concern  . Not on file  Social History Narrative  . Not on file     PHYSICAL EXAM:  VS: There were no vitals taken for this visit. Physical Exam Gen: NAD, alert, cooperative with exam, well-appearing ENT: normal lips, normal nasal mucosa,  Eye: normal EOM, normal conjunctiva and lids CV:  no edema, +2 pedal pulses   Resp: no accessory muscle use, non-labored,  GI: no masses or tenderness, no hernia  Skin: no rashes, no areas of induration  Neuro: normal tone, normal sensation to touch Psych:  normal insight, alert and oriented MSK:  ***      ASSESSMENT & PLAN:   No problem-specific Assessment & Plan notes found for this encounter.

## 2019-03-02 ENCOUNTER — Ambulatory Visit: Payer: Medicare Other | Admitting: Family Medicine

## 2019-03-07 ENCOUNTER — Other Ambulatory Visit: Payer: Self-pay | Admitting: Family Medicine

## 2019-03-07 DIAGNOSIS — G8929 Other chronic pain: Secondary | ICD-10-CM

## 2019-03-07 DIAGNOSIS — M25511 Pain in right shoulder: Secondary | ICD-10-CM

## 2019-03-14 ENCOUNTER — Other Ambulatory Visit: Payer: Self-pay

## 2019-03-15 ENCOUNTER — Ambulatory Visit (INDEPENDENT_AMBULATORY_CARE_PROVIDER_SITE_OTHER): Payer: Medicare Other | Admitting: Family Medicine

## 2019-03-15 ENCOUNTER — Encounter: Payer: Self-pay | Admitting: Family Medicine

## 2019-03-15 VITALS — BP 124/80 | HR 83 | Ht 66.0 in | Wt 168.0 lb

## 2019-03-15 DIAGNOSIS — E7800 Pure hypercholesterolemia, unspecified: Secondary | ICD-10-CM

## 2019-03-15 DIAGNOSIS — M48062 Spinal stenosis, lumbar region with neurogenic claudication: Secondary | ICD-10-CM | POA: Diagnosis not present

## 2019-03-15 DIAGNOSIS — Z8619 Personal history of other infectious and parasitic diseases: Secondary | ICD-10-CM

## 2019-03-15 DIAGNOSIS — K760 Fatty (change of) liver, not elsewhere classified: Secondary | ICD-10-CM | POA: Diagnosis not present

## 2019-03-15 DIAGNOSIS — N289 Disorder of kidney and ureter, unspecified: Secondary | ICD-10-CM | POA: Diagnosis not present

## 2019-03-15 DIAGNOSIS — F418 Other specified anxiety disorders: Secondary | ICD-10-CM

## 2019-03-15 LAB — COMPREHENSIVE METABOLIC PANEL
ALT: 14 U/L (ref 0–35)
AST: 14 U/L (ref 0–37)
Albumin: 4.1 g/dL (ref 3.5–5.2)
Alkaline Phosphatase: 74 U/L (ref 39–117)
BUN: 20 mg/dL (ref 6–23)
CO2: 23 mEq/L (ref 19–32)
Calcium: 9.2 mg/dL (ref 8.4–10.5)
Chloride: 101 mEq/L (ref 96–112)
Creatinine, Ser: 0.85 mg/dL (ref 0.40–1.20)
GFR: 66.49 mL/min (ref >60.00)
Glucose, Bld: 110 mg/dL — ABNORMAL HIGH (ref 70–99)
Potassium: 4.2 mEq/L (ref 3.5–5.1)
Sodium: 135 mEq/L (ref 135–145)
Total Bilirubin: 0.4 mg/dL (ref 0.2–1.2)
Total Protein: 6.7 g/dL (ref 6.0–8.3)

## 2019-03-15 LAB — LDL CHOLESTEROL, DIRECT: Direct LDL: 94 mg/dL

## 2019-03-15 NOTE — Progress Notes (Signed)
Established Patient Office Visit  Subjective:  Patient ID: Michele Hanson, female    DOB: [birth year]  Age: 68 y.o. MRN: [id number]  CC:  Chief Complaint  Patient presents with  . Follow-up    HPI Michele Hanson presents for follow-up of her depression that has been well controlled on long-term Escitalopram.  She would like to continue it.  She is having no issues taking the medication.  We discussed the fatty infiltration is seen on her liver ultrasound.  Triglycerides are not elevated and well controlled.  She is not obese.  She does not consume more than 1 or 2 alcoholic drinks daily.  There is no evidence of hepatitis C status post treatment with interferon.  Past labs did show some decrease in her GFR.  Patient has been concentrating on consuming more water.  Minimally invasive back surgery is planned for later this month.  Unfortunately continues with lower back pain shoulder pain wrist and hand pain.  Is seeing sports medicine for these issues.  Past Medical History:  Diagnosis Date  . GERD (gastroesophageal reflux disease)   . Hyperlipemia   . Hypertension     Past Surgical History:  Procedure Laterality Date  . CHOLECYSTECTOMY      Family History  Family history unknown: Yes    Social History   Socioeconomic History  . Marital status: Married    Spouse name: Not on file  . Number of children: Not on file  . Years of education: Not on file  . Highest education level: Not on file  Occupational History  . Not on file  Social Needs  . Financial resource strain: Not on file  . Food insecurity    Worry: Not on file    Inability: Not on file  . Transportation needs    Medical: Not on file    Non-medical: Not on file  Tobacco Use  . Smoking status: Never Smoker  . Smokeless tobacco: Never Used  Substance and Sexual Activity  . Alcohol use: Yes    Comment: 1-2 alcoholic drinks daily  . Drug use: Never  . Sexual activity: Not on file  Lifestyle  . Physical  activity    Days per week: Not on file    Minutes per session: Not on file  . Stress: Not on file  Relationships  . Social Herbalist on phone: Not on file    Gets together: Not on file    Attends religious service: Not on file    Active member of club or organization: Not on file    Attends meetings of clubs or organizations: Not on file    Relationship status: Not on file  . Intimate partner violence    Fear of current or ex partner: Not on file    Emotionally abused: Not on file    Physically abused: Not on file    Forced sexual activity: Not on file  Other Topics Concern  . Not on file  Social History Narrative  . Not on file    Outpatient Medications Prior to Visit  Medication Sig Dispense Refill  . atorvastatin (LIPITOR) 20 MG tablet Take 1 tablet (20 mg total) by mouth daily. 90 tablet 3  . Azelastine HCl 137 MCG/SPRAY SOLN Place 1 spray into both nostrils as needed. 1 Bottle 6  . citalopram (CELEXA) 20 MG tablet Take 1 tablet (20 mg total) by mouth daily. 90 tablet 1  . gabapentin (NEURONTIN) 300 MG capsule Take 1  capsule (300 mg total) by mouth 2 (two) times daily. 180 capsule 1  . HYDROcodone-acetaminophen (NORCO/VICODIN) 5-325 MG tablet As needed    . lisinopril-hydrochlorothiazide (ZESTORETIC) 20-12.5 MG tablet Take 1 tablet by mouth daily. 90 tablet 1  . meloxicam (MOBIC) 15 MG tablet Take 1 tablet (15 mg total) by mouth daily. Take for 10 days and then as needed. 30 tablet 1  . methocarbamol (ROBAXIN) 500 MG tablet TAKE 1 TABLET BY MOUTH EVERY 8 HOURS AS NEEDED FOR MUSCLE SPASMS 60 tablet 0  . nitroGLYCERIN (NITRODUR - DOSED IN MG/24 HR) 0.2 mg/hr patch Cut and apply 1/4 patch to most painful area q24h. 30 patch 11  . omeprazole (PRILOSEC) 20 MG capsule TAKE 1 CAPSULE(20 MG) BY MOUTH DAILY 90 capsule 1  . traMADol (ULTRAM) 50 MG tablet TAKE ONE TABLET BY MOUTH EVERY 8 HOURS AS NEEDED FOR SEVERE PAIN FOR UP TO 5 DAYS 30 tablet 0  . vitamin B-12  (CYANOCOBALAMIN) 500 MCG tablet TAKE 1 TABLET BY MOUTH EVERY DAY 30 tablet 5  . fluticasone (FLONASE) 50 MCG/ACT nasal spray Place 1 spray into both nostrils daily. 16 g 5   No facility-administered medications prior to visit.     Allergies  Allergen Reactions  . Ciprofloxacin Other (See Comments)    Neuropathy in feet  . Neomycin   . Nickel Itching    ROS Review of Systems  Constitutional: Negative for diaphoresis, fatigue, fever and unexpected weight change.  HENT: Negative.   Eyes: Negative for photophobia and visual disturbance.  Respiratory: Negative.   Cardiovascular: Negative.   Gastrointestinal: Negative.   Endocrine: Negative for polyphagia and polyuria.  Genitourinary: Negative.   Musculoskeletal: Positive for arthralgias and back pain. Negative for joint swelling.  Allergic/Immunologic: Negative for immunocompromised state.  Neurological: Negative for light-headedness and headaches.  Hematological: Negative.   Psychiatric/Behavioral: Positive for dysphoric mood. The patient is nervous/anxious.       Depression screen [facility] 2/9 03/15/2019 01/26/2019  Decreased Interest 1 0  Down, Depressed, Hopeless 1 0  PHQ - 2 Score 2 0  Altered sleeping 1 -  Tired, decreased energy 1 -  Change in appetite 0 -  Feeling bad or failure about yourself  0 -  Trouble concentrating 0 -  Moving slowly or fidgety/restless 0 -  Suicidal thoughts 0 -  PHQ-9 Score 4 -  Difficult doing work/chores Not difficult at all -      Objective:    Physical Exam  BP 124/80   Pulse 83   Ht '5\' 6"'$  (1.676 m)   Wt 168 lb (76.2 kg)   SpO2 95%   BMI 27.12 kg/m  Wt Readings from Last 3 Encounters:  03/15/19 168 lb (76.2 kg)  01/28/19 163 lb (73.9 kg)  01/18/19 164 lb (74.4 kg)   BP Readings from Last 3 Encounters:  03/15/19 124/80  01/28/19 140/89  01/18/19 126/80   Guideline developer:  UpToDate (see UpToDate for funding source) Date Released: June 2014  Health Maintenance Due   Topic Date Due  . Samul Dada  02/04/1970  . MAMMOGRAM  02/04/2001  . PNA vac Low Risk Adult (1 of 2 - PCV13) 02/05/2016    There are no preventive care reminders to display for this patient.  Lab Results  Component Value Date   TSH 1.37 07/15/2017   Lab Results  Component Value Date   WBC 5.0 09/24/2018   HGB 13.4 09/24/2018   HCT 40.3 09/24/2018   MCV 93.0 09/24/2018   PLT  186.0 09/24/2018   Lab Results  Component Value Date   NA 138 01/18/2019   K 3.9 01/18/2019   CHLORIDE 104 08/09/2013   CO2 23 01/18/2019   GLUCOSE 103 (H) 01/18/2019   BUN 15 01/18/2019   CREATININE 0.74 01/18/2019   BILITOT 0.4 09/24/2018   ALKPHOS 65 09/24/2018   AST 15 09/24/2018   ALT 14 09/24/2018   PROT 6.6 09/24/2018   ALBUMIN 4.3 09/24/2018   CALCIUM 9.2 01/18/2019   ANIONGAP 11 08/09/2013   GFR 78.06 01/18/2019   Lab Results  Component Value Date   CHOL 187 09/24/2018   Lab Results  Component Value Date   HDL 75.70 09/24/2018   Lab Results  Component Value Date   LDLCALC 86 09/24/2018   Lab Results  Component Value Date   TRIG 123.0 09/24/2018   Lab Results  Component Value Date   CHOLHDL 2 09/24/2018   No results found for: HGBA1C    Assessment & Plan:   Problem List Items Addressed This Visit      Digestive   History of hepatitis C - Primary   Relevant Orders   HIV antibody (with reflex)   NAFLD (nonalcoholic fatty liver disease)   Relevant Orders   Direct LDL   Comp Met (CMET)     Other   Spinal stenosis of lumbar region with neurogenic claudication   Elevated cholesterol   Relevant Orders   Direct LDL   Comp Met (CMET)   Depression with anxiety    Other Visit Diagnoses    Decreased renal function       Relevant Orders   Comp Met (CMET)      No orders of the defined types were placed in this encounter.   Follow-up: Return in about 3 months (around 06/13/2019).   Fortunately there is no evidence for ongoing liver inflammation from the hep  C.  Liver enzymes have been normal.  Advised her to consider discontinuing alcohol.  We will continue to monitor her lipid and liver functions.  She will continue to hydrate well.  Wished her luck with her upcoming surgery.

## 2019-03-15 NOTE — Addendum Note (Signed)
Addended by: Jon Billings on: 03/15/2019 10:35 AM   Modules accepted: Orders

## 2019-03-16 LAB — HIV ANTIBODY (ROUTINE TESTING W REFLEX): HIV 1&2 Ab, 4th Generation: NONREACTIVE

## 2019-03-17 ENCOUNTER — Encounter: Payer: Self-pay | Admitting: Family Medicine

## 2019-03-17 ENCOUNTER — Other Ambulatory Visit: Payer: Self-pay

## 2019-03-17 ENCOUNTER — Ambulatory Visit (INDEPENDENT_AMBULATORY_CARE_PROVIDER_SITE_OTHER): Payer: Medicare Other | Admitting: Family Medicine

## 2019-03-17 VITALS — BP 132/88 | HR 72 | Ht 66.0 in | Wt 165.0 lb

## 2019-03-17 DIAGNOSIS — M778 Other enthesopathies, not elsewhere classified: Secondary | ICD-10-CM | POA: Diagnosis not present

## 2019-03-17 DIAGNOSIS — G5603 Carpal tunnel syndrome, bilateral upper limbs: Secondary | ICD-10-CM | POA: Diagnosis not present

## 2019-03-17 DIAGNOSIS — M7582 Other shoulder lesions, left shoulder: Secondary | ICD-10-CM

## 2019-03-17 MED ORDER — NAPROXEN 500 MG PO TABS: 500.0000 mg | ORAL_TABLET | Freq: Two times a day (BID) | ORAL | 1 refills | Status: DC

## 2019-03-17 NOTE — Assessment & Plan Note (Signed)
Pain is still ongoing.  Had some improvement with her range of motion. -Counseled on home exercise therapy and supportive care. -Transition from Mobic to naproxen. -Could consider physical therapy or glenohumeral injection.

## 2019-03-17 NOTE — Progress Notes (Signed)
Michele Hanson - 68 y.o. female MRN [id number]  Date of birth: 1950/10/22  SUBJECTIVE:  Including CC & ROS.  Chief Complaint  Patient presents with  . Follow-up    follow up for bilateral shoulder    Michele Hanson is a 68 y.o. female that is following up for her right and left shoulder pain as well as presenting with hand pain and numbness.  She has been trying the nitro patches for the left shoulder and that seems to be helping.  She does take meloxicam on a daily basis but this offers limited improvement.  She has a back surgery scheduled for later this month.  She has numbness and tingling occurring in the palmar aspect of each hand.  She has gotten a wrist brace for the right but not the left.  The symptoms seem to be worse when she is using her hands or when she wakes up in the morning or at night.  Symptoms are becoming more constant.  Symptoms are not improved with the current dose of gabapentin.  Reports a history of fracture of the left wrist.  The left and right shoulder pain is still ongoing.  The home exercises seem to exacerbate some of this pain.  Pain is intermittent in nature.  It can be mild to moderate.   Review of Systems  Constitutional: Negative for fever.  HENT: Negative for congestion.   Respiratory: Negative for cough.   Cardiovascular: Negative for chest pain.  Gastrointestinal: Negative for abdominal pain.  Musculoskeletal: Positive for arthralgias and back pain.  Skin: Negative for color change.  Neurological: Negative for weakness.  Hematological: Negative for adenopathy.    HISTORY: Past Medical, Surgical, Social, and Family History Reviewed & Updated per EMR.   Pertinent Historical Findings include:  Past Medical History:  Diagnosis Date  . GERD (gastroesophageal reflux disease)   . Hyperlipemia   . Hypertension     Past Surgical History:  Procedure Laterality Date  . CHOLECYSTECTOMY      Allergies  Allergen Reactions  . Ciprofloxacin Other  (See Comments)    Neuropathy in feet  . Neomycin   . Nickel Itching    Family History  Family history unknown: Yes     Social History   Socioeconomic History  . Marital status: Married    Spouse name: Not on file  . Number of children: Not on file  . Years of education: Not on file  . Highest education level: Not on file  Occupational History  . Not on file  Social Needs  . Financial resource strain: Not on file  . Food insecurity    Worry: Not on file    Inability: Not on file  . Transportation needs    Medical: Not on file    Non-medical: Not on file  Tobacco Use  . Smoking status: Never Smoker  . Smokeless tobacco: Never Used  Substance and Sexual Activity  . Alcohol use: Yes    Comment: 1-2 alcoholic drinks daily  . Drug use: Never  . Sexual activity: Not on file  Lifestyle  . Physical activity    Days per week: Not on file    Minutes per session: Not on file  . Stress: Not on file  Relationships  . Social Herbalist on phone: Not on file    Gets together: Not on file    Attends religious service: Not on file    Active member of club or organization: Not on file  Attends meetings of clubs or organizations: Not on file    Relationship status: Not on file  . Intimate partner violence    Fear of current or ex partner: Not on file    Emotionally abused: Not on file    Physically abused: Not on file    Forced sexual activity: Not on file  Other Topics Concern  . Not on file  Social History Narrative  . Not on file     PHYSICAL EXAM:  VS: BP 132/88   Pulse 72   Ht 5\' 6"  (1.676 m)   Wt 165 lb (74.8 kg)   BMI 26.63 kg/m  Physical Exam Gen: NAD, alert, cooperative with exam, well-appearing ENT: normal lips, normal nasal mucosa,  Eye: normal EOM, normal conjunctiva and lids CV:  no edema, +2 pedal pulses   Resp: no accessory muscle use, non-labored,  Skin: no rashes, no areas of induration  Neuro: normal tone, normal sensation to touch  Psych:  normal insight, alert and oriented MSK:  Right and left hand: No signs of atrophy. Normal wrist range of motion. Normal grip strength. Normal pincer grasp. She has osteoarthritic changes of the MCP joints, PIP and DIP joints. Negative Tinel's at the wrist. Right shoulder: Limited external rotation. Normal strength resistance. Limited abduction. Left shoulder: Normal internal and external rotation. Normal strength resistance. Pain with empty can testing. Neurovascularly intact     ASSESSMENT & PLAN:   Capsulitis of right shoulder Pain is still ongoing.  Had some improvement with her range of motion. -Counseled on home exercise therapy and supportive care. -Transition from Mobic to naproxen. -Could consider physical therapy or glenohumeral injection.  Tendinitis of left rotator cuff Getting some improvement with the nitro patches.  Some of the home exercise seem to exacerbate some of her symptoms. -Continue nitroglycerin patches. -Counseled on home exercise therapy and supportive care. -Could consider injection or physical therapy.  Bilateral carpal tunnel syndrome Symptoms seem to be fairly acute in nature.  Has been feeling the symptoms in the palmar aspect of the hand.  Has tried the brace for the right hand.   -Counseled on brace and their use -Counseled on home exercise therapy and supportive care. -She is getting an EMG done sometime soon.

## 2019-03-17 NOTE — Assessment & Plan Note (Signed)
Symptoms seem to be fairly acute in nature.  Has been feeling the symptoms in the palmar aspect of the hand.  Has tried the brace for the right hand.   -Counseled on brace and their use -Counseled on home exercise therapy and supportive care. -She is getting an EMG done sometime soon.

## 2019-03-17 NOTE — Assessment & Plan Note (Signed)
Getting some improvement with the nitro patches.  Some of the home exercise seem to exacerbate some of her symptoms. -Continue nitroglycerin patches. -Counseled on home exercise therapy and supportive care. -Could consider injection or physical therapy.

## 2019-03-17 NOTE — Patient Instructions (Signed)
Good to see you Good luck with your surgery  Please try the exercises if they don't cause pain.  Please continue the nitro patches  Please try the wrist braces at night   Please stop the Mobic and try the naproxen  Please send me a message in MyChart with any questions or updates.  Please see me back in 6-8 weeks.   --Dr. Raeford Razor

## 2019-03-25 ENCOUNTER — Other Ambulatory Visit: Payer: Self-pay | Admitting: Family Medicine

## 2019-03-25 DIAGNOSIS — G629 Polyneuropathy, unspecified: Secondary | ICD-10-CM

## 2019-03-25 DIAGNOSIS — I10 Essential (primary) hypertension: Secondary | ICD-10-CM

## 2019-03-25 DIAGNOSIS — F418 Other specified anxiety disorders: Secondary | ICD-10-CM

## 2019-03-28 ENCOUNTER — Ambulatory Visit (INDEPENDENT_AMBULATORY_CARE_PROVIDER_SITE_OTHER): Payer: Medicare Other | Admitting: Family Medicine

## 2019-03-28 ENCOUNTER — Ambulatory Visit (INDEPENDENT_AMBULATORY_CARE_PROVIDER_SITE_OTHER): Payer: Medicare Other

## 2019-03-28 ENCOUNTER — Other Ambulatory Visit: Payer: Self-pay

## 2019-03-28 ENCOUNTER — Ambulatory Visit: Payer: Self-pay

## 2019-03-28 ENCOUNTER — Encounter: Payer: Self-pay | Admitting: Family Medicine

## 2019-03-28 VITALS — BP 122/64 | HR 80 | Temp 96.5°F | Ht 66.0 in | Wt 171.2 lb

## 2019-03-28 DIAGNOSIS — S63501A Unspecified sprain of right wrist, initial encounter: Secondary | ICD-10-CM | POA: Diagnosis not present

## 2019-03-28 DIAGNOSIS — S20211A Contusion of right front wall of thorax, initial encounter: Secondary | ICD-10-CM | POA: Diagnosis not present

## 2019-03-28 DIAGNOSIS — S80211A Abrasion, right knee, initial encounter: Secondary | ICD-10-CM | POA: Insufficient documentation

## 2019-03-28 NOTE — Telephone Encounter (Signed)
Patient called stating that she had a fall on Saturday walking her dogs.  She states her main concern is her right wrist.  She states that it is swollen and bruised on the underside.  She states her fingers are also swollen. She rates her pain a 2-3 if not trying to move her wrist.  She has tramadol and naproxen she is taking for other pain. That she has used for this.  She states she might have hit her head but is not concerned about head injury. Care advice read to patient.  She verbalized understanding.  Call transferred to office for scheduling.  Reason for Disposition . [1] High-risk adult (e.g., age > 59, osteoporosis, chronic steroid use) AND [2] still hurts  Answer Assessment - Initial Assessment Questions 1. ONSET: "When did the swelling start?" (e.g., minutes, hours, days, weeks)     saturday 2. LOCATION: "What part of the wrist is swollen?"  "Are both wrists swollen or just one wrist?"    rt 3. SEVERITY: "How bad is the swelling?"    - BALL OR LUMP: small ball or lump   - SKIN ONLY: localized; puffy or swollen area or patch of skin   - MILD JOINT SWELLING: joint feels or looks mildly swollen or puffy   - MODERATE JOINT SWELLING: moderate joint swelling; looks swollen   - SEVERE JOINT SWELLING:  severe joint swelling; can barely bend or move joint    moderate 4. RECURRENT SYMPTOM: "Have you had wrist swelling before?" If so, ask: "When was the last time?" "What happened that time?"    fell 5. CAUSE: "What do you think is causing the wrist swelling?" (e.g., arthritis, ganglion cyst, insect bite, recent injury)    Had fall 6. OTHER SYMPTOMS: "Do you have any other symptoms?" (e.g., fever, hand pain)     Wrist pain  7. PREGNANCY: "Is there any chance you are pregnant?" "When was your last menstrual period?"    N/A  Answer Assessment - Initial Assessment Questions 1. MECHANISM: "How did the injury happen?"     fall 2. ONSET: "When did the injury happen?" (Minutes or hours ago)   saturday 3. APPEARANCE of INJURY: "What does the injury look like?"     swollen 4. SEVERITY: "Can you use the hand normally?" "Can you bend your fingers into a ball and then fully open them?"    yes 5. SIZE: For cuts, bruises, or swelling, ask: "How large is it?" (e.g., inches or centimeters;  entire hand or wrist)      no 6. PAIN: "Is there pain?" If so, ask: "How bad is the pain?"  (Scale 1-10; or mild, moderate, severe)     2-3 7. TETANUS: For any breaks in the skin, ask: "When was the last tetanus booster?"    N/A 8. OTHER SYMPTOMS: "Do you have any other symptoms?"     Swelling, bruised 9. PREGNANCY: "Is there any chance you are pregnant?" "When was your last menstrual period?"  N/A  Protocols used: HAND AND WRIST INJURY-A-AH, WRIST [facility]

## 2019-03-28 NOTE — Progress Notes (Signed)
Established Patient Office Visit  Subjective:  Patient ID: Michele Hanson, female    DOB: [birth year]  Age: 68 y.o. MRN: [id number]  CC:  Chief Complaint  Patient presents with  . Fall    while walking on her dogs on saturday   . Wrist Pain    right wrist, swelling     HPI Michele Hanson presents for evaluation and treatment of injury to her right wrist she had a flu this injury while walking her 3 dogs 2 days ago.  Believes that her right elbow struck her rib cage and bruised it.  She also sustained some abrasions to her right knee.  Her right wrist is swollen and somewhat painful.  Past Medical History:  Diagnosis Date  . GERD (gastroesophageal reflux disease)   . Hyperlipemia   . Hypertension     Past Surgical History:  Procedure Laterality Date  . CHOLECYSTECTOMY      Family History  Family history unknown: Yes    Social History   Socioeconomic History  . Marital status: Married    Spouse name: Not on file  . Number of children: Not on file  . Years of education: Not on file  . Highest education level: Not on file  Occupational History  . Not on file  Tobacco Use  . Smoking status: Never Smoker  . Smokeless tobacco: Never Used  Substance and Sexual Activity  . Alcohol use: Yes    Comment: 1-2 alcoholic drinks daily  . Drug use: Never  . Sexual activity: Not on file  Other Topics Concern  . Not on file  Social History Narrative  . Not on file   Social Determinants of Health   Financial Resource Strain:   . Difficulty of Paying Living Expenses: Not on file  Food Insecurity:   . Worried About Charity fundraiser in the Last Year: Not on file  . Ran Out of Food in the Last Year: Not on file  Transportation Needs:   . Lack of Transportation (Medical): Not on file  . Lack of Transportation (Non-Medical): Not on file  Physical Activity:   . Days of Exercise per Week: Not on file  . Minutes of Exercise per Session: Not on file  Stress:   . Feeling  of Stress : Not on file  Social Connections:   . Frequency of Communication with Friends and Family: Not on file  . Frequency of Social Gatherings with Friends and Family: Not on file  . Attends Religious Services: Not on file  . Active Member of Clubs or Organizations: Not on file  . Attends Archivist Meetings: Not on file  . Marital Status: Not on file  Intimate Partner Violence:   . Fear of Current or Ex-Partner: Not on file  . Emotionally Abused: Not on file  . Physically Abused: Not on file  . Sexually Abused: Not on file    Outpatient Medications Prior to Visit  Medication Sig Dispense Refill  . atorvastatin (LIPITOR) 20 MG tablet Take 1 tablet (20 mg total) by mouth daily. 90 tablet 3  . Azelastine HCl 137 MCG/SPRAY SOLN Place 1 spray into both nostrils as needed. 1 Bottle 6  . citalopram (CELEXA) 20 MG tablet TAKE ONE TABLET BY MOUTH ONE TIME DAILY 90 tablet 1  . fluticasone (FLONASE) 50 MCG/ACT nasal spray Place 1 spray into both nostrils daily. 16 g 5  . gabapentin (NEURONTIN) 300 MG capsule TAKE ONE CAPSULE BY MOUTH TWICE A DAY  180 capsule 1  . lisinopril-hydrochlorothiazide (ZESTORETIC) 20-12.5 MG tablet TAKE ONE TABLET BY MOUTH ONE TIME DAILY 90 tablet 1  . naproxen (NAPROSYN) 500 MG tablet Take 1 tablet (500 mg total) by mouth 2 (two) times daily with a meal. 60 tablet 1  . nitroGLYCERIN (NITRODUR - DOSED IN MG/24 HR) 0.2 mg/hr patch Cut and apply 1/4 patch to most painful area q24h. 30 patch 11  . omeprazole (PRILOSEC) 20 MG capsule TAKE 1 CAPSULE(20 MG) BY MOUTH DAILY 90 capsule 1  . traMADol (ULTRAM) 50 MG tablet TAKE ONE TABLET BY MOUTH EVERY 8 HOURS AS NEEDED FOR SEVERE PAIN FOR UP TO 5 DAYS 30 tablet 0  . methocarbamol (ROBAXIN) 500 MG tablet TAKE 1 TABLET BY MOUTH EVERY 8 HOURS AS NEEDED FOR MUSCLE SPASMS (Patient not taking: Reported on 03/28/2019) 60 tablet 0  . HYDROcodone-acetaminophen (NORCO/VICODIN) 5-325 MG tablet As needed    . meloxicam (MOBIC) 15  MG tablet Take 1 tablet (15 mg total) by mouth daily. Take for 10 days and then as needed. (Patient not taking: Reported on 03/28/2019) 30 tablet 1  . vitamin B-12 (CYANOCOBALAMIN) 500 MCG tablet TAKE 1 TABLET BY MOUTH EVERY DAY (Patient not taking: Reported on 03/28/2019) 30 tablet 5   No facility-administered medications prior to visit.    Allergies  Allergen Reactions  . Ciprofloxacin Other (See Comments)    Neuropathy in feet  . Neomycin   . Nickel Itching    ROS Review of Systems  Constitutional: Negative.   Respiratory: Negative.  Negative for chest tightness and shortness of breath.   Cardiovascular: Negative for chest pain.  Gastrointestinal: Negative.   Musculoskeletal: Positive for joint swelling.  Neurological: Negative for weakness and numbness.  Hematological: Does not bruise/bleed easily.  Psychiatric/Behavioral: Negative.       Objective:    Physical Exam  Constitutional: She is oriented to person, place, and time. She appears well-developed and well-nourished. No distress.  HENT:  Head: Normocephalic and atraumatic.  Right Ear: External ear normal.  Left Ear: External ear normal.  Eyes: Conjunctivae are normal. Right eye exhibits no discharge. Left eye exhibits no discharge. No scleral icterus.  Neck: No JVD present. No tracheal deviation present.  Pulmonary/Chest: Effort normal. No stridor.  Musculoskeletal:     Right wrist: Swelling, tenderness and bony tenderness present. Normal range of motion.       Arms:     Right knee: Ecchymosis present.       Legs:  Neurological: She is alert and oriented to person, place, and time.  Skin: She is not diaphoretic.  Psychiatric: She has a normal mood and affect. Her behavior is normal.    BP 122/64   Pulse 80   Temp (!) 96.5 F (35.8 C)   Ht 5\' 6"  (1.676 m)   Wt 171 lb 3.2 oz (77.7 kg)   SpO2 97%   BMI 27.63 kg/m  Wt Readings from Last 3 Encounters:  03/28/19 171 lb 3.2 oz (77.7 kg)  03/17/19 165 lb  (74.8 kg)  03/15/19 168 lb (76.2 kg)     Health Maintenance Due  Topic Date Due  . TETANUS/TDAP  02/04/1970  . MAMMOGRAM  02/04/2001  . PNA vac Low Risk Adult (1 of 2 - PCV13) 02/05/2016    There are no preventive care reminders to display for this patient.  Lab Results  Component Value Date   TSH 1.37 07/15/2017   Lab Results  Component Value Date   WBC 5.0 09/24/2018  HGB 13.4 09/24/2018   HCT 40.3 09/24/2018   MCV 93.0 09/24/2018   PLT 186.0 09/24/2018   Lab Results  Component Value Date   NA 135 03/15/2019   K 4.2 03/15/2019   CHLORIDE 104 08/09/2013   CO2 23 03/15/2019   GLUCOSE 110 (H) 03/15/2019   BUN 20 03/15/2019   CREATININE 0.85 03/15/2019   BILITOT 0.4 03/15/2019   ALKPHOS 74 03/15/2019   AST 14 03/15/2019   ALT 14 03/15/2019   PROT 6.7 03/15/2019   ALBUMIN 4.1 03/15/2019   CALCIUM 9.2 03/15/2019   ANIONGAP 11 08/09/2013   GFR 66.49 03/15/2019   Lab Results  Component Value Date   CHOL 187 09/24/2018   Lab Results  Component Value Date   HDL 75.70 09/24/2018   Lab Results  Component Value Date   LDLCALC 86 09/24/2018   Lab Results  Component Value Date   TRIG 123.0 09/24/2018   Lab Results  Component Value Date   CHOLHDL 2 09/24/2018   No results found for: HGBA1C    Assessment & Plan:   Problem List Items Addressed This Visit      Musculoskeletal and Integument   Abrasion of right knee   Sprain of right wrist - Primary   Relevant Orders   DG Wrist Complete Right     Other   Contusion of right chest wall      No orders of the defined types were placed in this encounter.   Follow-up: No follow-ups on file.    Libby Maw, MD

## 2019-03-31 ENCOUNTER — Other Ambulatory Visit: Payer: Self-pay | Admitting: Family Medicine

## 2019-03-31 DIAGNOSIS — M25511 Pain in right shoulder: Secondary | ICD-10-CM

## 2019-03-31 DIAGNOSIS — G8929 Other chronic pain: Secondary | ICD-10-CM

## 2019-05-23 ENCOUNTER — Other Ambulatory Visit: Payer: Self-pay | Admitting: Family Medicine

## 2019-06-02 ENCOUNTER — Ambulatory Visit: Payer: Medicare Other | Admitting: Family Medicine

## 2019-06-09 ENCOUNTER — Other Ambulatory Visit: Payer: Self-pay | Admitting: Family Medicine

## 2019-06-13 ENCOUNTER — Other Ambulatory Visit: Payer: Self-pay

## 2019-06-14 ENCOUNTER — Encounter: Payer: Self-pay | Admitting: Family Medicine

## 2019-06-14 ENCOUNTER — Ambulatory Visit (INDEPENDENT_AMBULATORY_CARE_PROVIDER_SITE_OTHER): Payer: Medicare Other | Admitting: Family Medicine

## 2019-06-14 VITALS — BP 122/82 | HR 82 | Temp 95.8°F | Ht 66.0 in | Wt 168.4 lb

## 2019-06-14 DIAGNOSIS — M25541 Pain in joints of right hand: Secondary | ICD-10-CM

## 2019-06-14 DIAGNOSIS — F418 Other specified anxiety disorders: Secondary | ICD-10-CM | POA: Diagnosis not present

## 2019-06-14 DIAGNOSIS — M25511 Pain in right shoulder: Secondary | ICD-10-CM | POA: Diagnosis not present

## 2019-06-14 DIAGNOSIS — E7800 Pure hypercholesterolemia, unspecified: Secondary | ICD-10-CM | POA: Diagnosis not present

## 2019-06-14 DIAGNOSIS — M25542 Pain in joints of left hand: Secondary | ICD-10-CM

## 2019-06-14 DIAGNOSIS — G8929 Other chronic pain: Secondary | ICD-10-CM

## 2019-06-14 MED ORDER — TRAMADOL HCL 50 MG PO TABS: ORAL_TABLET | ORAL | 0 refills | Status: DC

## 2019-06-14 MED ORDER — CITALOPRAM HYDROBROMIDE 20 MG PO TABS: 20.0000 mg | ORAL_TABLET | Freq: Every day | ORAL | 1 refills | Status: DC

## 2019-06-14 NOTE — Progress Notes (Signed)
Established Patient Office Visit  Subjective:  Patient ID: Michele Hanson, female    DOB: [birth year]  Age: 69 y.o. MRN: [id number]  CC:  Chief Complaint  Patient presents with  . Follow-up    3 month follow up on anxiety and depression, no concerns.     HPI Michele Hanson presents for follow-up of her depression and anxiety that is been well controlled with the Lexapro but she has been challenged recently with the passing of her father this past January.  She is also status post lumbar fusion and carpal tunnel release back to back surgeries in the last few months.  Complains of ongoing bilateral shoulder and wrist pain.  Requests a refill of the Ultram that she uses sparingly augmented with Tylenol.  Also using some Voltaren gel.  Past Medical History:  Diagnosis Date  . GERD (gastroesophageal reflux disease)   . Hyperlipemia   . Hypertension     Past Surgical History:  Procedure Laterality Date  . CHOLECYSTECTOMY      Family History  Family history unknown: Yes    Social History   Socioeconomic History  . Marital status: Married    Spouse name: Not on file  . Number of children: Not on file  . Years of education: Not on file  . Highest education level: Not on file  Occupational History  . Not on file  Tobacco Use  . Smoking status: Never Smoker  . Smokeless tobacco: Never Used  Substance and Sexual Activity  . Alcohol use: Yes    Comment: 1-2 alcoholic drinks daily  . Drug use: Never  . Sexual activity: Not on file  Other Topics Concern  . Not on file  Social History Narrative  . Not on file   Social Determinants of Health   Financial Resource Strain:   . Difficulty of Paying Living Expenses: Not on file  Food Insecurity:   . Worried About Charity fundraiser in the Last Year: Not on file  . Ran Out of Food in the Last Year: Not on file  Transportation Needs:   . Lack of Transportation (Medical): Not on file  . Lack of Transportation (Non-Medical):  Not on file  Physical Activity:   . Days of Exercise per Week: Not on file  . Minutes of Exercise per Session: Not on file  Stress:   . Feeling of Stress : Not on file  Social Connections:   . Frequency of Communication with Friends and Family: Not on file  . Frequency of Social Gatherings with Friends and Family: Not on file  . Attends Religious Services: Not on file  . Active Member of Clubs or Organizations: Not on file  . Attends Archivist Meetings: Not on file  . Marital Status: Not on file  Intimate Partner Violence:   . Fear of Current or Ex-Partner: Not on file  . Emotionally Abused: Not on file  . Physically Abused: Not on file  . Sexually Abused: Not on file    Outpatient Medications Prior to Visit  Medication Sig Dispense Refill  . atorvastatin (LIPITOR) 20 MG tablet Take 1 tablet (20 mg total) by mouth daily. 90 tablet 3  . Azelastine HCl 137 MCG/SPRAY SOLN Place 1 spray into both nostrils as needed. 1 Bottle 6  . gabapentin (NEURONTIN) 300 MG capsule TAKE ONE CAPSULE BY MOUTH TWICE A DAY 180 capsule 1  . lisinopril-hydrochlorothiazide (ZESTORETIC) 20-12.5 MG tablet TAKE ONE TABLET BY MOUTH ONE TIME DAILY 90 tablet  1  . naproxen (NAPROSYN) 500 MG tablet Take 1 tablet (500 mg total) by mouth 2 (two) times daily with a meal. 60 tablet 1  . omeprazole (PRILOSEC) 20 MG capsule TAKE ONE CAPSULE BY MOUTH ONE TIME DAILY 90 capsule 0  . citalopram (CELEXA) 20 MG tablet TAKE ONE TABLET BY MOUTH ONE TIME DAILY 90 tablet 1  . traMADol (ULTRAM) 50 MG tablet TAKE ONE TABLET BY MOUTH EVERY 8 HOURS AS NEEDED FOR SEVERE PAIN . TAKE FOR UP TO 5 DAYS 30 tablet 0  . fluticasone (FLONASE) 50 MCG/ACT nasal spray Place 1 spray into both nostrils daily. 16 g 5  . methocarbamol (ROBAXIN) 500 MG tablet TAKE 1 TABLET BY MOUTH EVERY 8 HOURS AS NEEDED FOR MUSCLE SPASMS (Patient not taking: Reported on 03/28/2019) 60 tablet 0  . nitroGLYCERIN (NITRODUR - DOSED IN MG/24 HR) 0.2 mg/hr patch  Cut and apply 1/4 patch to most painful area q24h. (Patient not taking: Reported on 06/14/2019) 30 patch 11   No facility-administered medications prior to visit.    Allergies  Allergen Reactions  . Ciprofloxacin Other (See Comments)    Neuropathy in feet  . Neomycin   . Nickel Itching    ROS Review of Systems  Constitutional: Negative.   HENT: Negative.   Eyes: Negative for photophobia and visual disturbance.  Respiratory: Negative.   Cardiovascular: Negative.   Gastrointestinal: Negative.   Endocrine: Negative for polyphagia and polyuria.  Genitourinary: Negative.   Musculoskeletal: Positive for arthralgias.  Allergic/Immunologic: Negative for immunocompromised state.  Neurological: Negative for seizures and numbness.  Psychiatric/Behavioral: Positive for dysphoric mood.      Objective:    Physical Exam  Constitutional: She is oriented to person, place, and time. She appears well-developed and well-nourished. No distress.  HENT:  Head: Normocephalic and atraumatic.  Right Ear: External ear normal.  Left Ear: External ear normal.  Eyes: Conjunctivae are normal. Right eye exhibits no discharge. Left eye exhibits no discharge. No scleral icterus.  Neck: No JVD present. No tracheal deviation present.  Cardiovascular: Normal rate, regular rhythm and normal heart sounds.  Pulmonary/Chest: Effort normal and breath sounds normal. No stridor.  Neurological: She is alert and oriented to person, place, and time.  Skin: Skin is warm and dry. She is not diaphoretic.  Psychiatric: She has a normal mood and affect. Her behavior is normal.    BP 122/82   Pulse 82   Temp (!) 95.8 F (35.4 C) (Tympanic)   Ht 5\' 6"  (1.676 m)   Wt 168 lb 6.4 oz (76.4 kg)   SpO2 98%   BMI 27.18 kg/m  Wt Readings from Last 3 Encounters:  06/14/19 168 lb 6.4 oz (76.4 kg)  03/28/19 171 lb 3.2 oz (77.7 kg)  03/17/19 165 lb (74.8 kg)     Health Maintenance Due  Topic Date Due  . TETANUS/TDAP   02/04/1970  . MAMMOGRAM  02/04/2001  . PNA vac Low Risk Adult (1 of 2 - PCV13) 02/05/2016    There are no preventive care reminders to display for this patient.  Lab Results  Component Value Date   TSH 1.37 07/15/2017   Lab Results  Component Value Date   WBC 5.0 09/24/2018   HGB 13.4 09/24/2018   HCT 40.3 09/24/2018   MCV 93.0 09/24/2018   PLT 186.0 09/24/2018   Lab Results  Component Value Date   NA 135 03/15/2019   K 4.2 03/15/2019   CHLORIDE 104 08/09/2013   CO2 23 03/15/2019  GLUCOSE 110 (H) 03/15/2019   BUN 20 03/15/2019   CREATININE 0.85 03/15/2019   BILITOT 0.4 03/15/2019   ALKPHOS 74 03/15/2019   AST 14 03/15/2019   ALT 14 03/15/2019   PROT 6.7 03/15/2019   ALBUMIN 4.1 03/15/2019   CALCIUM 9.2 03/15/2019   ANIONGAP 11 08/09/2013   GFR 66.49 03/15/2019   Lab Results  Component Value Date   CHOL 187 09/24/2018   Lab Results  Component Value Date   HDL 75.70 09/24/2018   Lab Results  Component Value Date   LDLCALC 86 09/24/2018   Lab Results  Component Value Date   TRIG 123.0 09/24/2018   Lab Results  Component Value Date   CHOLHDL 2 09/24/2018   No results found for: HGBA1C    Assessment & Plan:   Problem List Items Addressed This Visit      Other   Elevated cholesterol   Depression with anxiety - Primary   Relevant Medications   citalopram (CELEXA) 20 MG tablet   Joint pain in both hands   Relevant Medications   traMADol (ULTRAM) 50 MG tablet   Chronic right shoulder pain   Relevant Medications   citalopram (CELEXA) 20 MG tablet   traMADol (ULTRAM) 50 MG tablet      Meds ordered this encounter  Medications  . citalopram (CELEXA) 20 MG tablet    Sig: Take 1 tablet (20 mg total) by mouth daily.    Dispense:  90 tablet    Refill:  1  . traMADol (ULTRAM) 50 MG tablet    Sig: TAKE ONE TABLET BY MOUTH EVERY 8 HOURS AS NEEDED FOR SEVERE PAIN . TAKE FOR UP TO 5 DAYS    Dispense:  30 tablet    Refill:  0    Follow-up: Return  in about 6 months (around 12/15/2019).    Libby Maw, MD

## 2019-06-28 ENCOUNTER — Encounter: Payer: Self-pay | Admitting: Nurse Practitioner

## 2019-06-28 ENCOUNTER — Telehealth (INDEPENDENT_AMBULATORY_CARE_PROVIDER_SITE_OTHER): Payer: Medicare Other | Admitting: Nurse Practitioner

## 2019-06-28 ENCOUNTER — Other Ambulatory Visit: Payer: Self-pay

## 2019-06-28 VITALS — Ht 66.0 in

## 2019-06-28 DIAGNOSIS — M25531 Pain in right wrist: Secondary | ICD-10-CM | POA: Diagnosis not present

## 2019-06-28 DIAGNOSIS — G8929 Other chronic pain: Secondary | ICD-10-CM

## 2019-06-28 MED ORDER — DICLOFENAC SODIUM 1 % EX GEL: 2.0000 g | Freq: Four times a day (QID) | CUTANEOUS | Status: DC

## 2019-06-28 NOTE — Progress Notes (Signed)
Virtual Visit via Video Note  I connected with@ on 06/28/19 at 12:30 PM EDT by a video enabled telemedicine application and verified that I am speaking with the correct person using two identifiers.  Location: Patient:Home Provider: Office Participants: patient and provider  I discussed the limitations of evaluation and management by telemedicine and the availability of in person appointments. I also discussed with the patient that there may be a patient responsible charge related to this service. The patient expressed understanding and agreed to proceed.  [id number] right wrist pain  History of Present Illness: Wrist Pain  The pain is present in the right wrist. This is a chronic problem. The current episode started more than 1 year ago. There has been no history of extremity trauma. The problem occurs intermittently. The problem has been waxing and waning. The quality of the pain is described as aching. Associated symptoms include joint swelling. Pertinent negatives include no fever, inability to bear weight, itching, joint locking, limited range of motion, numbness, stiffness or tingling. The symptoms are aggravated by activity. She has tried rest for the symptoms. The treatment provided significant relief. Family history does not include gout or rheumatoid arthritis. Her past medical history is significant for osteoarthritis. There is no history of diabetes, gout or rheumatoid arthritis.   Observations/Objective: Physical Exam  Constitutional: She is oriented to person, place, and time. No distress.  Pulmonary/Chest: Effort normal.  Musculoskeletal:        General: Tenderness present.     Right forearm: Normal.     Right wrist: Swelling and tenderness present. No effusion. Normal range of motion.     Right hand: No swelling. Normal range of motion.  Neurological: She is alert and oriented to person, place, and time.  Skin: No rash noted. No erythema. No pallor.    Assessment and  Plan: Michele Hanson was seen today for wrist pain.  Diagnoses and all orders for this visit:  Chronic wrist pain, right -     diclofenac Sodium (VOLTAREN) 1 % GEL; Apply 2 g topically 4 (four) times daily.    Follow Up Instructions: See avs   I discussed the assessment and treatment plan with the patient. The patient was provided an opportunity to ask questions and all were answered. The patient agreed with the plan and demonstrated an understanding of the instructions.   The patient was advised to call back or seek an in-person evaluation if the symptoms worsen or if the condition fails to improve as anticipated.  Daphane Shepherd, NP

## 2019-06-28 NOTE — Patient Instructions (Signed)
Use voltaren gel as directed on package. Use wrist brace daily for at least 1week May apply cold compress as needed.

## 2019-07-07 ENCOUNTER — Other Ambulatory Visit: Payer: Self-pay | Admitting: Family Medicine

## 2019-07-07 DIAGNOSIS — J301 Allergic rhinitis due to pollen: Secondary | ICD-10-CM

## 2019-07-22 ENCOUNTER — Ambulatory Visit (INDEPENDENT_AMBULATORY_CARE_PROVIDER_SITE_OTHER): Payer: Medicare Other | Admitting: Family Medicine

## 2019-07-22 ENCOUNTER — Ambulatory Visit: Payer: Self-pay

## 2019-07-22 ENCOUNTER — Encounter: Payer: Self-pay | Admitting: Family Medicine

## 2019-07-22 ENCOUNTER — Other Ambulatory Visit: Payer: Self-pay

## 2019-07-22 VITALS — BP 126/82 | HR 78 | Ht 66.0 in | Wt 158.0 lb

## 2019-07-22 DIAGNOSIS — M25531 Pain in right wrist: Secondary | ICD-10-CM

## 2019-07-22 DIAGNOSIS — M654 Radial styloid tenosynovitis [de Quervain]: Secondary | ICD-10-CM | POA: Diagnosis not present

## 2019-07-22 MED ORDER — METHYLPREDNISOLONE ACETATE 40 MG/ML IJ SUSP
40.0000 mg | Freq: Once | INTRAMUSCULAR | Status: AC
  Administered 2019-07-22: 11:00:00 40 mg via INTRA_ARTICULAR

## 2019-07-22 NOTE — Patient Instructions (Signed)
Good to see you Please try the brace for 1-2 weeks  Please try ice  Please try the exercise once the pain has imroved   Please send me a message in MyChart with any questions or updates.  Please see me back in 4 weeks or sooner if needed.   --Dr. Raeford Razor

## 2019-07-22 NOTE — Progress Notes (Signed)
Michele Hanson - 69 y.o. female MRN [id number]  Date of birth: 12-Oct-1950  SUBJECTIVE:  Including CC & ROS.  Chief Complaint  Patient presents with  . Wrist Pain    right wrist     Michele Hanson is a 70 y.o. female that is presenting with pain on the right radial aspect of the wrist.  This is been ongoing for a few weeks.  Seems to be occurring in the radial aspect of the base of her thumb.  She has recently had carpal tunnel surgery on the left and right wrist.  Seems to be worse with any movement.  No improvement with modalities to date.   Review of Systems See HPI   HISTORY: Past Medical, Surgical, Social, and Family History Reviewed & Updated per EMR.   Pertinent Historical Findings include:  Past Medical History:  Diagnosis Date  . GERD (gastroesophageal reflux disease)   . Hyperlipemia   . Hypertension     Past Surgical History:  Procedure Laterality Date  . CHOLECYSTECTOMY      Family History  Family history unknown: Yes    Social History   Socioeconomic History  . Marital status: Married    Spouse name: Not on file  . Number of children: Not on file  . Years of education: Not on file  . Highest education level: Not on file  Occupational History  . Not on file  Tobacco Use  . Smoking status: Never Smoker  . Smokeless tobacco: Never Used  Substance and Sexual Activity  . Alcohol use: Yes    Comment: 1-2 alcoholic drinks daily  . Drug use: Never  . Sexual activity: Not on file  Other Topics Concern  . Not on file  Social History Narrative  . Not on file   Social Determinants of Health   Financial Resource Strain:   . Difficulty of Paying Living Expenses:   Food Insecurity:   . Worried About Charity fundraiser in the Last Year:   . Arboriculturist in the Last Year:   Transportation Needs:   . Film/video editor (Medical):   Marland Kitchen Lack of Transportation (Non-Medical):   Physical Activity:   . Days of Exercise per Week:   . Minutes of Exercise  per Session:   Stress:   . Feeling of Stress :   Social Connections:   . Frequency of Communication with Friends and Family:   . Frequency of Social Gatherings with Friends and Family:   . Attends Religious Services:   . Active Member of Clubs or Organizations:   . Attends Archivist Meetings:   Marland Kitchen Marital Status:   Intimate Partner Violence:   . Fear of Current or Ex-Partner:   . Emotionally Abused:   Marland Kitchen Physically Abused:   . Sexually Abused:      PHYSICAL EXAM:  VS: BP 126/82   Pulse 78   Ht 5\' 6"  (1.676 m)   Wt 158 lb (71.7 kg)   BMI 25.50 kg/m  Physical Exam Gen: NAD, alert, cooperative with exam, well-appearing MSK:  Right wrist: Swelling along the first dorsal compartment. Tenderness palpation of the first dorsal compartment. Normal wrist range of motion. Positive Finkelstein's test. Neurovascular intact  Limited ultrasound: Right wrist:  Significant effusion and hyperemia as well as thickening of the first dorsal compartment tendons.   No significant effusion of the wrist joint. No changes to represent intersection syndrome.  Summary: Findings suggestive of de Quervain's tenosynovitis.  Ultrasound and interpretation  by Clearance Coots, MD   Aspiration/Injection Procedure Note Michele Hanson 1950-09-01  Procedure: Injection Indications: Right wrist pain  Procedure Details Consent: Risks of procedure as well as the alternatives and risks of each were explained to the (patient/caregiver).  Consent for procedure obtained. Time Out: Verified patient identification, verified procedure, site/side was marked, verified correct patient position, special equipment/implants available, medications/allergies/relevent history reviewed, required imaging and test results available.  Performed.  The area was cleaned with iodine and alcohol swabs.    The right first dorsal compartment was injected using 1 cc's of 40 mg Depo-Medrol and 1 cc's of 0.25% bupivacaine  with a 25 1 1/2" needle.  Ultrasound was used. Images were obtained in long and short views showing the injection.     A sterile dressing was applied.  Patient did tolerate procedure well.      ASSESSMENT & PLAN:   De Quervain's tenosynovitis, right Significant hyperemia and thickening of the tendons of the first dorsal compartment. -Injection. -Thumb spica. -Counseled on home exercise therapy and supportive care. -Could consider physical therapy if needed.

## 2019-07-22 NOTE — Assessment & Plan Note (Signed)
Significant hyperemia and thickening of the tendons of the first dorsal compartment. -Injection. -Thumb spica. -Counseled on home exercise therapy and supportive care. -Could consider physical therapy if needed.

## 2019-07-28 ENCOUNTER — Encounter: Payer: Self-pay | Admitting: Family Medicine

## 2019-07-29 NOTE — Telephone Encounter (Signed)
Treatment now is for her to take the statin with an ldl goal of < 70.

## 2019-08-16 ENCOUNTER — Other Ambulatory Visit: Payer: Self-pay | Admitting: Family Medicine

## 2019-08-16 DIAGNOSIS — M48062 Spinal stenosis, lumbar region with neurogenic claudication: Secondary | ICD-10-CM

## 2019-08-16 NOTE — Telephone Encounter (Signed)
Refill request for pending medication patient last visit with Dr. Ethelene Hal 06/14/19 for joint and shoulder pain and virtual visit with [facility] on 06/28/19 for wrist pain. Medication was discontinued by Dr. Ethelene Hal on 06/14/19 but refill was give on 06/28/19. Please advise okay to refill?

## 2019-08-18 NOTE — Progress Notes (Signed)
  Michele Hanson - 69 y.o. female MRN [id number]  Date of birth: [birth year]  SUBJECTIVE:  Including CC & ROS.  Chief Complaint  Patient presents with  . Follow-up    right wrist    Michele Hanson is a 69 y.o. female that is following up for her de Quervain's tenosynovitis.  She feels back to almost normal since the injection.  She does have some bulging of the tendon itself.   Review of Systems See HPI   HISTORY: Past Medical, Surgical, Social, and Family History Reviewed & Updated per EMR.   Pertinent Historical Findings include:  Past Medical History:  Diagnosis Date  . GERD (gastroesophageal reflux disease)   . Hyperlipemia   . Hypertension     Past Surgical History:  Procedure Laterality Date  . CHOLECYSTECTOMY      Family History  Family history unknown: Yes    Social History   Socioeconomic History  . Marital status: Married    Spouse name: Not on file  . Number of children: Not on file  . Years of education: Not on file  . Highest education level: Not on file  Occupational History  . Not on file  Tobacco Use  . Smoking status: Never Smoker  . Smokeless tobacco: Never Used  Substance and Sexual Activity  . Alcohol use: Yes    Comment: 1-2 alcoholic drinks daily  . Drug use: Never  . Sexual activity: Not on file  Other Topics Concern  . Not on file  Social History Narrative  . Not on file   Social Determinants of Health   Financial Resource Strain:   . Difficulty of Paying Living Expenses:   Food Insecurity:   . Worried About Charity fundraiser in the Last Year:   . Arboriculturist in the Last Year:   Transportation Needs:   . Film/video editor (Medical):   Marland Kitchen Lack of Transportation (Non-Medical):   Physical Activity:   . Days of Exercise per Week:   . Minutes of Exercise per Session:   Stress:   . Feeling of Stress :   Social Connections:   . Frequency of Communication with Friends and Family:   . Frequency of Social Gatherings with  Friends and Family:   . Attends Religious Services:   . Active Member of Clubs or Organizations:   . Attends Archivist Meetings:   Marland Kitchen Marital Status:   Intimate Partner Violence:   . Fear of Current or Ex-Partner:   . Emotionally Abused:   Marland Kitchen Physically Abused:   . Sexually Abused:      PHYSICAL EXAM:  VS: BP 132/86   Pulse 69   Ht 5\' 6"  (1.676 m)   Wt 155 lb (70.3 kg)   BMI 25.02 kg/m  Physical Exam Gen: NAD, alert, cooperative with exam, well-appearing MSK:  Right wrist: No swelling or ecchymosis. Normal range of motion. There is some enlargement of the first dorsal compartment. Neurovascularly intact     ASSESSMENT & PLAN:   De Quervain's tenosynovitis, right Pain and swelling have improved.  Second tendon still occurring in the first dorsal compartment. -Counseled on home exercise therapy and supportive care. -Can try nitro patches. -Can continue thumb spica if she is doing a lot of moving. -Could consider physical therapy.

## 2019-08-19 ENCOUNTER — Encounter: Payer: Self-pay | Admitting: Family Medicine

## 2019-08-19 ENCOUNTER — Other Ambulatory Visit: Payer: Self-pay

## 2019-08-19 ENCOUNTER — Ambulatory Visit (INDEPENDENT_AMBULATORY_CARE_PROVIDER_SITE_OTHER): Payer: Medicare Other | Admitting: Family Medicine

## 2019-08-19 DIAGNOSIS — M654 Radial styloid tenosynovitis [de Quervain]: Secondary | ICD-10-CM | POA: Diagnosis not present

## 2019-08-19 NOTE — Assessment & Plan Note (Signed)
Pain and swelling have improved.  Second tendon still occurring in the first dorsal compartment. -Counseled on home exercise therapy and supportive care. -Can try nitro patches. -Can continue thumb spica if she is doing a lot of moving. -Could consider physical therapy.

## 2019-09-16 ENCOUNTER — Other Ambulatory Visit: Payer: Self-pay | Admitting: Family Medicine

## 2019-09-16 DIAGNOSIS — E7800 Pure hypercholesterolemia, unspecified: Secondary | ICD-10-CM

## 2019-09-21 ENCOUNTER — Other Ambulatory Visit: Payer: Self-pay | Admitting: Family Medicine

## 2019-09-21 DIAGNOSIS — G8929 Other chronic pain: Secondary | ICD-10-CM

## 2019-09-21 DIAGNOSIS — M25541 Pain in joints of right hand: Secondary | ICD-10-CM

## 2019-09-21 NOTE — Telephone Encounter (Signed)
[id number] see refill req/thx dmf

## 2019-10-03 ENCOUNTER — Other Ambulatory Visit: Payer: Self-pay

## 2019-10-03 ENCOUNTER — Ambulatory Visit (INDEPENDENT_AMBULATORY_CARE_PROVIDER_SITE_OTHER): Payer: Medicare Other | Admitting: Family Medicine

## 2019-10-03 ENCOUNTER — Encounter: Payer: Self-pay | Admitting: Family Medicine

## 2019-10-03 VITALS — BP 100/70 | HR 87 | Temp 97.0°F | Ht 66.0 in | Wt 154.6 lb

## 2019-10-03 DIAGNOSIS — T63441A Toxic effect of venom of bees, accidental (unintentional), initial encounter: Secondary | ICD-10-CM

## 2019-10-03 DIAGNOSIS — L03119 Cellulitis of unspecified part of limb: Secondary | ICD-10-CM | POA: Diagnosis not present

## 2019-10-03 MED ORDER — CLARITHROMYCIN ER 500 MG PO TB24: 1000.0000 mg | ORAL_TABLET | Freq: Every day | ORAL | 0 refills | Status: DC

## 2019-10-03 MED ORDER — CLARITHROMYCIN ER 500 MG PO TB24: 1000.0000 mg | ORAL_TABLET | Freq: Every day | ORAL | 0 refills | Status: AC

## 2019-10-03 NOTE — Patient Instructions (Addendum)
Cellulitis, Adult  Cellulitis is a skin infection. The infected area is usually warm, red, swollen, and tender. This condition occurs most often in the arms and lower legs. The infection can travel to the muscles, blood, and underlying tissue and become serious. It is very important to get treated for this condition. What are the causes? Cellulitis is caused by bacteria. The bacteria enter through a break in the skin, such as a cut, burn, insect bite, open sore, or crack. What increases the risk? This condition is more likely to occur in people who:  Have a weak body defense system (immune system).  Have open wounds on the skin, such as cuts, burns, bites, and scrapes. Bacteria can enter the body through these open wounds.  Are older than 69 years of age.  Have diabetes.  Have a type of long-lasting (chronic) liver disease (cirrhosis) or kidney disease.  Are obese.  Have a skin condition such as: ? Itchy rash (eczema). ? Slow movement of blood in the veins (venous stasis). ? Fluid buildup below the skin (edema).  Have had radiation therapy.  Use IV drugs. What are the signs or symptoms? Symptoms of this condition include:  Redness, streaking, or spotting on the skin.  Swollen area of the skin.  Tenderness or pain when an area of the skin is touched.  Warm skin.  A fever.  Chills.  Blisters. How is this diagnosed? This condition is diagnosed based on a medical history and physical exam. You may also have tests, including:  Blood tests.  Imaging tests. How is this treated? Treatment for this condition may include:  Medicines, such as antibiotic medicines or medicines to treat allergies (antihistamines).  Supportive care, such as rest and application of cold or warm cloths (compresses) to the skin.  Hospital care, if the condition is severe. The infection usually starts to get better within 1-2 days of treatment. Follow these instructions at  home:  Medicines  Take over-the-counter and prescription medicines only as told by your health care provider.  If you were prescribed an antibiotic medicine, take it as told by your health care provider. Do not stop taking the antibiotic even if you start to feel better. General instructions  Drink enough fluid to keep your urine pale yellow.  Do not touch or rub the infected area.  Raise (elevate) the infected area above the level of your heart while you are sitting or lying down.  Apply warm or cold compresses to the affected area as told by your health care provider.  Keep all follow-up visits as told by your health care provider. This is important. These visits let your health care provider make sure a more serious infection is not developing. Contact a health care provider if:  You have a fever.  Your symptoms do not begin to improve within 1-2 days of starting treatment.  Your bone or joint underneath the infected area becomes painful after the skin has healed.  Your infection returns in the same area or another area.  You notice a swollen bump in the infected area.  You develop new symptoms.  You have a general ill feeling (malaise) with muscle aches and pains. Get help right away if:  Your symptoms get worse.  You feel very sleepy.  You develop vomiting or diarrhea that persists.  You notice red streaks coming from the infected area.  Your red area gets larger or turns dark in color. These symptoms may represent a serious problem that is an  emergency. Do not wait to see if the symptoms will go away. Get medical help right away. Call your local emergency services (911 in the [place] Do not drive yourself to the hospital. Summary  Cellulitis is a skin infection. This condition occurs most often in the arms and lower legs.  Treatment for this condition may include medicines, such as antibiotic medicines or antihistamines.  Take over-the-counter and prescription  medicines only as told by your health care provider. If you were prescribed an antibiotic medicine, do not stop taking the antibiotic even if you start to feel better.  Contact a health care provider if your symptoms do not begin to improve within 1-2 days of starting treatment or your symptoms get worse.  Keep all follow-up visits as told by your health care provider. This is important. These visits let your health care provider make sure that a more serious infection is not developing. This information is not intended to replace advice given to you by your health care provider. Make sure you discuss any questions you have with your health care provider. Document Revised: 08/20/2017 Document Reviewed: 08/20/2017 Elsevier Patient Education  [place], [place], or [employer], Adult Bees, wasps, and hornets are part of a family of insects that can sting people. These stings can cause pain and inflammation, but they are usually not serious. However, some people may have an allergic reaction to a sting. This can cause the symptoms to be more severe. What increases the risk? You may be at a greater risk of getting stung if you:  Provoke a stinging insect by swatting or disturbing it.  Wear strong-smelling soaps, deodorants, or body sprays.  Spend time outdoors near gardens with flowers or fruit trees or in clothes that expose skin.  Eat or drink outside. What are the signs or symptoms? Common symptoms of this condition include:  A red lump in the skin that sometimes has a tiny hole in the center. In some cases, a stinger may be in the center of the wound.  Pain and itching at the sting site.  Redness and swelling around the sting site. If you have an allergic reaction (localized allergic reaction), the swelling and redness may spread out from the sting site. In some cases, this reaction can continue to develop over the next 24-48 hours. In rare cases, a person may have a severe  allergic reaction (anaphylactic reaction) to a sting. Symptoms of an anaphylactic reaction may include:  Wheezing or difficulty breathing.  Raised, itchy, red patches on the skin (hives).  Nausea or vomiting.  Abdominal cramping.  Diarrhea.  Tightness in the chest or chest pain.  Dizziness or fainting.  Redness of the face (flushing).  Hoarse voice.  Swollen tongue, lips, or face. How is this diagnosed? This condition is usually diagnosed based on your symptoms and medical history as well as a physical exam. You may have an allergy test to determine if you are allergic to the substance that the insect injected during the sting (venom). How is this treated? If you were stung by a bee, the stinger and a small sac of venom may be in the wound. It is important to remove the stinger as soon as possible. You can do this by brushing across the wound with gauze, a fingernail, or a flat card such as a credit card. Removing the stinger can help reduce the severity of your body's reaction to the sting. Most stings can be treated with:  Icing  to reduce swelling in the area.  Medicines (antihistamines) to treat itching or an allergic reaction.  Medicines to help reduce pain. These may be medicines that you take by mouth, or medicated creams or lotions that you apply to your skin. Pay close attention to your symptoms after you have been stung. If possible, have someone stay with you to make sure you do not have an allergic reaction. If you have any signs of an allergic reaction, call your health care provider. If you have ever had a severe allergic reaction, your health care provider may give you an inhaler or injectable medicine (epinephrine auto-injector) to use if necessary. Follow these instructions at home:   Wash the sting site 2-3 times each day with soap and water as told by your health care provider.  Apply or take over-the-counter and prescription medicines only as told by your  health care provider.  If directed, apply ice to the sting area. ? Put ice in a plastic bag. ? Place a towel between your skin and the bag. ? Leave the ice on for 20 minutes, 2-3 times a day.  Do not scratch the sting area.  If you had a severe allergic reaction to a sting, you may need: ? To wear a medical bracelet or necklace that lists the allergy. ? To learn when and how to use an anaphylaxis kit or epinephrine injection. Your family members and coworkers may also need to learn this. ? To carry an anaphylaxis kit or epinephrine injection with you at all times. How is this prevented?  Avoid swatting at stinging insects and disturbing insect nests.  Do not use fragrant soaps or lotions.  Wear shoes, pants, and long sleeves when spending time outdoors, especially in grassy areas where stinging insects are common.  Keep outdoor areas free from nests or hives.  Keep food and drink containers covered when eating outdoors.  Avoid working or sitting near [employer], if possible.  Wear gloves if you are gardening or working outdoors.  If an attack by a stinging insect or a swarm seems likely in the moment, move away from the area or find a barrier between you and the insect(s), such as a door. Contact a health care provider if:  Your symptoms do not get better in 2-3 days.  You have redness, swelling, or pain that spreads beyond the area of the sting.  You have a fever. Get help right away if: You have symptoms of a severe allergic reaction. These include:  Wheezing or difficulty breathing.  Tightness in the chest or chest pain.  Light-headedness or fainting.  Itchy, raised, red patches on the skin.  Nausea or vomiting.  Abdominal cramping.  Diarrhea.  A swollen tongue or lips, or trouble swallowing.  Dizziness or fainting. Summary  Stings from bees, wasps, and hornets can cause pain and inflammation, but they are usually not serious. However, some people  may have an allergic reaction to a sting. This can cause the symptoms to be more severe.  Pay close attention to your symptoms after you have been stung. If possible, have someone stay with you to make sure you do not have an allergic reaction.  Call your health care provider if you have any signs of an allergic reaction. This information is not intended to replace advice given to you by your health care provider. Make sure you discuss any questions you have with your health care provider. Document Revised: 03/26/2017 Document Reviewed: 06/05/2016 Elsevier Patient Education  [place].

## 2019-10-03 NOTE — Progress Notes (Signed)
Established Patient Office Visit  Subjective:  Patient ID: Michele Hanson, female    DOB: 1951-04-12  Age: 69 y.o. MRN: [id number]  CC:  Chief Complaint  Patient presents with  . Insect Bite    C/O yellow jacket sting 1 weeks ago swelling have gone down rash still present. Patient trying OTC medications not helping.     HPI Michele Hanson presents for follow-up after multiple yellowjacket stings 5 days ago.  Both lower extremities affected.  There seems to be some expansion of some the areas where the bites had occurred.  There has been no fevers chills, difficulty breathing or swallowing in the throat.  She has no history of bee sting allergy.  Past Medical History:  Diagnosis Date  . GERD (gastroesophageal reflux disease)   . Hyperlipemia   . Hypertension     Past Surgical History:  Procedure Laterality Date  . CHOLECYSTECTOMY      Family History  Family history unknown: Yes    Social History   Socioeconomic History  . Marital status: Married    Spouse name: Not on file  . Number of children: Not on file  . Years of education: Not on file  . Highest education level: Not on file  Occupational History  . Not on file  Tobacco Use  . Smoking status: Never Smoker  . Smokeless tobacco: Never Used  Substance and Sexual Activity  . Alcohol use: Yes    Comment: 1-2 alcoholic drinks daily  . Drug use: Never  . Sexual activity: Not on file  Other Topics Concern  . Not on file  Social History Narrative  . Not on file   Social Determinants of Health   Financial Resource Strain:   . Difficulty of Paying Living Expenses:   Food Insecurity:   . Worried About Charity fundraiser in the Last Year:   . Arboriculturist in the Last Year:   Transportation Needs:   . Film/video editor (Medical):   Marland Kitchen Lack of Transportation (Non-Medical):   Physical Activity:   . Days of Exercise per Week:   . Minutes of Exercise per Session:   Stress:   . Feeling of Stress :     Social Connections:   . Frequency of Communication with Friends and Family:   . Frequency of Social Gatherings with Friends and Family:   . Attends Religious Services:   . Active Member of Clubs or Organizations:   . Attends Archivist Meetings:   Marland Kitchen Marital Status:   Intimate Partner Violence:   . Fear of Current or Ex-Partner:   . Emotionally Abused:   Marland Kitchen Physically Abused:   . Sexually Abused:     Outpatient Medications Prior to Visit  Medication Sig Dispense Refill  . atorvastatin (LIPITOR) 20 MG tablet TAKE ONE TABLET BY MOUTH ONE TIME DAILY 90 tablet 3  . azelastine (ASTELIN) 0.1 % nasal spray USE ONE SPRAY INTO BOTH NOSTRILS AS NEEDED 30 mL 5  . citalopram (CELEXA) 20 MG tablet Take 1 tablet (20 mg total) by mouth daily. 90 tablet 1  . diclofenac Sodium (VOLTAREN) 1 % GEL Apply 2 g topically 4 (four) times daily.    . fluticasone (FLONASE) 50 MCG/ACT nasal spray USE ONE SPRAY IN EACH NOSTRIL DAILY **SHAKE BEFORE USING** 16 mL 4  . gabapentin (NEURONTIN) 300 MG capsule TAKE ONE CAPSULE BY MOUTH TWICE A DAY 180 capsule 1  . lisinopril-hydrochlorothiazide (ZESTORETIC) 20-12.5 MG tablet TAKE ONE TABLET  BY MOUTH ONE TIME DAILY 90 tablet 1  . meloxicam (MOBIC) 15 MG tablet Take 15 mg by mouth daily.    . methocarbamol (ROBAXIN) 500 MG tablet TAKE ONE TABLET BY MOUTH EVERY 8 HOURS AS NEEDED FOR MUSCLE SPASMS 60 tablet 0  . omeprazole (PRILOSEC) 20 MG capsule TAKE ONE CAPSULE BY MOUTH ONE TIME DAILY 90 capsule 0  . traMADol (ULTRAM) 50 MG tablet TAKE ONE TABLET BY MOUTH EVERY 8 HOURS AS NEEDED FOR SEVERE PAIN FOR UP TO 5 DAYS 30 tablet 0   No facility-administered medications prior to visit.    Allergies  Allergen Reactions  . Ciprofloxacin Other (See Comments)    Neuropathy in feet  . Neomycin Itching    Eye drops  . Nickel Itching and Rash    ROS Review of Systems  Constitutional: Negative for chills, diaphoresis, fatigue, fever and unexpected weight change.   HENT: Negative.   Respiratory: Negative.  Negative for chest tightness, shortness of breath and wheezing.   Cardiovascular: Negative.   Gastrointestinal: Negative.  Negative for abdominal pain and vomiting.  Genitourinary: Negative.   Skin: Positive for color change and rash.  Neurological: Negative for light-headedness, numbness and headaches.  Hematological: Does not bruise/bleed easily.  Psychiatric/Behavioral: Negative.       Objective:    Physical Exam Vitals and nursing note reviewed.  Constitutional:      General: She is not in acute distress.    Appearance: Normal appearance. She is not ill-appearing, toxic-appearing or diaphoretic.  HENT:     Head: Normocephalic and atraumatic.     Right Ear: External ear normal.     Left Ear: External ear normal.  Eyes:     General: No scleral icterus.       Right eye: No discharge.        Left eye: No discharge.     Conjunctiva/sclera: Conjunctivae normal.  Pulmonary:     Effort: Pulmonary effort is normal.  Skin:    General: Skin is warm and dry.       Neurological:     Mental Status: She is alert and oriented to person, place, and time.  Psychiatric:        Mood and Affect: Mood normal.        Behavior: Behavior normal.     BP 100/70   Pulse 87   Temp (!) 97 F (36.1 C) (Tympanic)   Ht 5\' 6"  (1.676 m)   Wt 154 lb 9.6 oz (70.1 kg)   SpO2 96%   BMI 24.95 kg/m  Wt Readings from Last 3 Encounters:  10/03/19 154 lb 9.6 oz (70.1 kg)  08/19/19 155 lb (70.3 kg)  07/22/19 158 lb (71.7 kg)     Health Maintenance Due  Topic Date Due  . COVID-19 Vaccine (1) Never done  . TETANUS/TDAP  Never done  . MAMMOGRAM  Never done  . PNA vac Low Risk Adult (1 of 2 - PCV13) Never done    There are no preventive care reminders to display for this patient.  Lab Results  Component Value Date   TSH 1.37 07/15/2017   Lab Results  Component Value Date   WBC 5.0 09/24/2018   HGB 13.4 09/24/2018   HCT 40.3 09/24/2018   MCV  93.0 09/24/2018   PLT 186.0 09/24/2018   Lab Results  Component Value Date   NA 135 03/15/2019   K 4.2 03/15/2019   CHLORIDE 104 08/09/2013   CO2 23 03/15/2019   GLUCOSE  110 (H) 03/15/2019   BUN 20 03/15/2019   CREATININE 0.85 03/15/2019   BILITOT 0.4 03/15/2019   ALKPHOS 74 03/15/2019   AST 14 03/15/2019   ALT 14 03/15/2019   PROT 6.7 03/15/2019   ALBUMIN 4.1 03/15/2019   CALCIUM 9.2 03/15/2019   ANIONGAP 11 08/09/2013   GFR 66.49 03/15/2019   Lab Results  Component Value Date   CHOL 187 09/24/2018   Lab Results  Component Value Date   HDL 75.70 09/24/2018   Lab Results  Component Value Date   LDLCALC 86 09/24/2018   Lab Results  Component Value Date   TRIG 123.0 09/24/2018   Lab Results  Component Value Date   CHOLHDL 2 09/24/2018   No results found for: HGBA1C    Assessment & Plan:   Problem List Items Addressed This Visit    None    Visit Diagnoses    Bee sting, accidental or unintentional, initial encounter    -  Primary   Relevant Medications   clarithromycin (BIAXIN XL) 500 MG 24 hr tablet   Cellulitis of lower extremity, unspecified laterality       Relevant Medications   clarithromycin (BIAXIN XL) 500 MG 24 hr tablet      Meds ordered this encounter  Medications  . DISCONTD: clarithromycin (BIAXIN XL) 500 MG 24 hr tablet    Sig: Take 2 tablets (1,000 mg total) by mouth daily for 10 days.    Dispense:  20 tablet    Refill:  0  . clarithromycin (BIAXIN XL) 500 MG 24 hr tablet    Sig: Take 2 tablets (1,000 mg total) by mouth daily for 10 days.    Dispense:  20 tablet    Refill:  0    Hold atorvastatin    Follow-up: Return if symptoms worsen or fail to improve.  Will use Biaxin for its antiinflammatory properties as well as its antibacterial properties.  Patient given information on cellulitis and bee sting allergy.  She is aware that this may predispose her to more serious reactions.  This was discussed.  She will hold her atorvastatin  while taking the Biaxin.  Libby Maw, MD

## 2019-10-08 ENCOUNTER — Other Ambulatory Visit: Payer: Self-pay | Admitting: Family Medicine

## 2019-10-08 DIAGNOSIS — I10 Essential (primary) hypertension: Secondary | ICD-10-CM

## 2019-10-08 DIAGNOSIS — G629 Polyneuropathy, unspecified: Secondary | ICD-10-CM

## 2019-12-06 ENCOUNTER — Other Ambulatory Visit: Payer: Self-pay | Admitting: Family Medicine

## 2019-12-14 ENCOUNTER — Other Ambulatory Visit: Payer: Self-pay

## 2019-12-15 ENCOUNTER — Encounter: Payer: Self-pay | Admitting: Family Medicine

## 2019-12-15 ENCOUNTER — Ambulatory Visit (INDEPENDENT_AMBULATORY_CARE_PROVIDER_SITE_OTHER): Payer: Medicare Other | Admitting: Family Medicine

## 2019-12-15 VITALS — BP 122/78 | HR 68 | Temp 97.6°F | Ht 66.0 in | Wt 152.4 lb

## 2019-12-15 DIAGNOSIS — F418 Other specified anxiety disorders: Secondary | ICD-10-CM | POA: Diagnosis not present

## 2019-12-15 DIAGNOSIS — E7800 Pure hypercholesterolemia, unspecified: Secondary | ICD-10-CM | POA: Diagnosis not present

## 2019-12-15 DIAGNOSIS — M48062 Spinal stenosis, lumbar region with neurogenic claudication: Secondary | ICD-10-CM

## 2019-12-15 DIAGNOSIS — E538 Deficiency of other specified B group vitamins: Secondary | ICD-10-CM | POA: Diagnosis not present

## 2019-12-15 DIAGNOSIS — G629 Polyneuropathy, unspecified: Secondary | ICD-10-CM

## 2019-12-15 DIAGNOSIS — I10 Essential (primary) hypertension: Secondary | ICD-10-CM

## 2019-12-15 LAB — CBC
HCT: 39.4 % (ref 36.0–46.0)
Hemoglobin: 13.1 g/dL (ref 12.0–15.0)
MCHC: 33.3 g/dL (ref 30.0–36.0)
MCV: 92.2 fl (ref 78.0–100.0)
Platelets: 176 10*3/uL (ref 150.0–400.0)
RBC: 4.28 Mil/uL (ref 3.87–5.11)
RDW: 12.6 % (ref 11.5–15.5)
WBC: 6 10*3/uL (ref 4.0–10.5)

## 2019-12-15 LAB — LIPID PANEL
Cholesterol: 182 mg/dL (ref 0–200)
HDL: 84.3 mg/dL (ref >39.00)
LDL Cholesterol: 84 mg/dL (ref 0–99)
NonHDL: 98.05
Total CHOL/HDL Ratio: 2
Triglycerides: 69 mg/dL (ref 0.0–149.0)
VLDL: 13.8 mg/dL (ref 0.0–40.0)

## 2019-12-15 LAB — COMPREHENSIVE METABOLIC PANEL
ALT: 12 U/L (ref 0–35)
AST: 14 U/L (ref 0–37)
Albumin: 4.4 g/dL (ref 3.5–5.2)
Alkaline Phosphatase: 58 U/L (ref 39–117)
BUN: 19 mg/dL (ref 6–23)
CO2: 27 mEq/L (ref 19–32)
Calcium: 9.2 mg/dL (ref 8.4–10.5)
Chloride: 103 mEq/L (ref 96–112)
Creatinine, Ser: 0.96 mg/dL (ref 0.40–1.20)
GFR: 57.65 mL/min — ABNORMAL LOW (ref >60.00)
Glucose, Bld: 94 mg/dL (ref 70–99)
Potassium: 4.1 mEq/L (ref 3.5–5.1)
Sodium: 140 mEq/L (ref 135–145)
Total Bilirubin: 0.5 mg/dL (ref 0.2–1.2)
Total Protein: 6.6 g/dL (ref 6.0–8.3)

## 2019-12-15 LAB — VITAMIN B12: Vitamin B-12: 181 pg/mL — ABNORMAL LOW (ref 211–911)

## 2019-12-15 LAB — LDL CHOLESTEROL, DIRECT: Direct LDL: 82 mg/dL

## 2019-12-15 MED ORDER — METHOCARBAMOL 500 MG PO TABS: ORAL_TABLET | ORAL | 0 refills | Status: DC

## 2019-12-15 MED ORDER — CITALOPRAM HYDROBROMIDE 20 MG PO TABS: 20.0000 mg | ORAL_TABLET | Freq: Every day | ORAL | 1 refills | Status: DC

## 2019-12-15 NOTE — Addendum Note (Signed)
Addended by: Abelino Derrick A on: 12/15/2019 11:09 AM   Modules accepted: Orders

## 2019-12-15 NOTE — Progress Notes (Addendum)
Established Patient Office Visit  Subjective:  Patient ID: Michele Hanson, female    DOB: May 20, 1950  Age: 69 y.o. MRN: [id number]  CC:  Chief Complaint  Patient presents with  . Follow-up    follow up on depression and anxiety, no concerns.     HPI Michele Hanson presents for follow-up of depression, hypertension elevated cholesterol and B12 deficiency.  Patient is on high-dose weekly vitamin D for osteopenia per GYN.  She is taking the Prolia shot.  Blood pressure is controlled with Zestoretic and exercise.  She has been losing weight with exercise and calorie counting.  Continues to have 1-2 alcoholic drinks daily and no more.  Citalopram has worked well for depression and anxiety for her.  She has been on it for years.  Lower back pain greatly relieved with recent surgery.  It took her a month to 6 weeks to recover.  Neuropathy in feet persists.  Recent TSH normal.  She continues to take a multivitamin.  B12 levels have been low in the past.  Past Medical History:  Diagnosis Date  . GERD (gastroesophageal reflux disease)   . Hyperlipemia   . Hypertension     Past Surgical History:  Procedure Laterality Date  . CHOLECYSTECTOMY      Family History  Family history unknown: Yes    Social History   Socioeconomic History  . Marital status: Married    Spouse name: Not on file  . Number of children: Not on file  . Years of education: Not on file  . Highest education level: Not on file  Occupational History  . Not on file  Tobacco Use  . Smoking status: Never Smoker  . Smokeless tobacco: Never Used  Substance and Sexual Activity  . Alcohol use: Yes    Comment: 1-2 alcoholic drinks daily  . Drug use: Never  . Sexual activity: Not on file  Other Topics Concern  . Not on file  Social History Narrative  . Not on file   Social Determinants of Health   Financial Resource Strain:   . Difficulty of Paying Living Expenses: Not on file  Food Insecurity:   . Worried  About Charity fundraiser in the Last Year: Not on file  . Ran Out of Food in the Last Year: Not on file  Transportation Needs:   . Lack of Transportation (Medical): Not on file  . Lack of Transportation (Non-Medical): Not on file  Physical Activity:   . Days of Exercise per Week: Not on file  . Minutes of Exercise per Session: Not on file  Stress:   . Feeling of Stress : Not on file  Social Connections:   . Frequency of Communication with Friends and Family: Not on file  . Frequency of Social Gatherings with Friends and Family: Not on file  . Attends Religious Services: Not on file  . Active Member of Clubs or Organizations: Not on file  . Attends Archivist Meetings: Not on file  . Marital Status: Not on file  Intimate Partner Violence:   . Fear of Current or Ex-Partner: Not on file  . Emotionally Abused: Not on file  . Physically Abused: Not on file  . Sexually Abused: Not on file    Outpatient Medications Prior to Visit  Medication Sig Dispense Refill  . atorvastatin (LIPITOR) 20 MG tablet TAKE ONE TABLET BY MOUTH ONE TIME DAILY 90 tablet 3  . azelastine (ASTELIN) 0.1 % nasal spray USE ONE SPRAY INTO  BOTH NOSTRILS AS NEEDED 30 mL 5  . diclofenac Sodium (VOLTAREN) 1 % GEL Apply 2 g topically 4 (four) times daily.    . fluticasone (FLONASE) 50 MCG/ACT nasal spray USE ONE SPRAY IN EACH NOSTRIL DAILY **SHAKE BEFORE USING** 16 mL 4  . gabapentin (NEURONTIN) 300 MG capsule TAKE ONE CAPSULE BY MOUTH TWICE A DAY 180 capsule 1  . lisinopril-hydrochlorothiazide (ZESTORETIC) 20-12.5 MG tablet TAKE ONE TABLET BY MOUTH ONE TIME DAILY 90 tablet 1  . meloxicam (MOBIC) 15 MG tablet Take 15 mg by mouth daily.    Marland Kitchen omeprazole (PRILOSEC) 20 MG capsule TAKE ONE CAPSULE BY MOUTH ONE TIME DAILY 90 capsule 0  . traMADol (ULTRAM) 50 MG tablet TAKE ONE TABLET BY MOUTH EVERY 8 HOURS AS NEEDED FOR SEVERE PAIN FOR UP TO 5 DAYS 30 tablet 0  . citalopram (CELEXA) 20 MG tablet Take 1 tablet (20 mg  total) by mouth daily. 90 tablet 1  . methocarbamol (ROBAXIN) 500 MG tablet TAKE ONE TABLET BY MOUTH EVERY 8 HOURS AS NEEDED FOR MUSCLE SPASMS 60 tablet 0  . estradiol (ESTRACE) 0.1 MG/GM vaginal cream      No facility-administered medications prior to visit.    Allergies  Allergen Reactions  . Ciprofloxacin Other (See Comments)    Neuropathy in feet  . Neomycin Itching    Eye drops  . Nickel Itching and Rash    ROS Review of Systems  Constitutional: Negative.   HENT: Negative.   Eyes: Negative for photophobia and visual disturbance.  Respiratory: Negative.   Cardiovascular: Negative.   Gastrointestinal: Negative.   Endocrine: Negative for polyphagia.  Genitourinary: Negative.   Musculoskeletal: Positive for back pain.  Allergic/Immunologic: Negative for immunocompromised state.  Neurological: Positive for numbness. Negative for light-headedness.  Hematological: Does not bruise/bleed easily.     Depression screen [facility] 2/9 12/15/2019 06/14/2019 03/15/2019  Decreased Interest 0 1 1  Down, Depressed, Hopeless 0 2 1  PHQ - 2 Score 0 3 2  Altered sleeping 1 1 1   Tired, decreased energy 0 1 1  Change in appetite 0 0 0  Feeling bad or failure about yourself  0 0 0  Trouble concentrating 0 0 0  Moving slowly or fidgety/restless 0 0 0  Suicidal thoughts 0 0 0  PHQ-9 Score 1 5 4   Difficult doing work/chores Not difficult at all Not difficult at all Not difficult at all     Depression screen [facility] 2/9 12/15/2019 06/14/2019 03/15/2019  Decreased Interest 0 1 1  Down, Depressed, Hopeless 0 2 1  PHQ - 2 Score 0 3 2  Altered sleeping 1 1 1   Tired, decreased energy 0 1 1  Change in appetite 0 0 0  Feeling bad or failure about yourself  0 0 0  Trouble concentrating 0 0 0  Moving slowly or fidgety/restless 0 0 0  Suicidal thoughts 0 0 0  PHQ-9 Score 1 5 4   Difficult doing work/chores Not difficult at all Not difficult at all Not difficult at all      Objective:    Physical  Exam Vitals and nursing note reviewed.  Constitutional:      General: She is not in acute distress.    Appearance: Normal appearance. She is normal weight. She is not ill-appearing, toxic-appearing or diaphoretic.  HENT:     Head: Normocephalic and atraumatic.     Right Ear: Tympanic membrane, ear canal and external ear normal.     Left Ear: Tympanic membrane, ear  canal and external ear normal.     Mouth/Throat:     Mouth: Mucous membranes are moist.     Pharynx: Oropharynx is clear. No oropharyngeal exudate or posterior oropharyngeal erythema.  Eyes:     General: No scleral icterus.       Right eye: No discharge.        Left eye: No discharge.     Conjunctiva/sclera: Conjunctivae normal.     Pupils: Pupils are equal, round, and reactive to light.  Cardiovascular:     Rate and Rhythm: Normal rate and regular rhythm.  Pulmonary:     Effort: Pulmonary effort is normal.     Breath sounds: Normal breath sounds.  Abdominal:     General: Abdomen is flat. Bowel sounds are normal. There is no distension.     Palpations: Abdomen is soft. There is no mass.     Tenderness: There is no abdominal tenderness. There is no guarding or rebound.     Hernia: No hernia is present.  Musculoskeletal:     Cervical back: Normal range of motion. No rigidity or tenderness.     Right lower leg: No edema.     Left lower leg: No edema.  Lymphadenopathy:     Cervical: No cervical adenopathy.  Skin:    General: Skin is warm and dry.  Neurological:     Mental Status: She is alert and oriented to person, place, and time.  Psychiatric:        Mood and Affect: Mood normal.        Behavior: Behavior normal.     BP 122/78   Pulse 68   Temp 97.6 F (36.4 C) (Tympanic)   Ht 5\' 6"  (1.676 m)   Wt 152 lb 6.4 oz (69.1 kg)   SpO2 97%   BMI 24.60 kg/m  Wt Readings from Last 3 Encounters:  12/15/19 152 lb 6.4 oz (69.1 kg)  10/03/19 154 lb 9.6 oz (70.1 kg)  08/19/19 155 lb (70.3 kg)     Health  Maintenance Due  Topic Date Due  . TETANUS/TDAP  Never done  . MAMMOGRAM  Never done  . PNA vac Low Risk Adult (1 of 2 - PCV13) Never done  . INFLUENZA VACCINE  11/13/2019    There are no preventive care reminders to display for this patient.  Lab Results  Component Value Date   TSH 1.37 07/15/2017   Lab Results  Component Value Date   WBC 5.0 09/24/2018   HGB 13.4 09/24/2018   HCT 40.3 09/24/2018   MCV 93.0 09/24/2018   PLT 186.0 09/24/2018   Lab Results  Component Value Date   NA 135 03/15/2019   K 4.2 03/15/2019   CHLORIDE 104 08/09/2013   CO2 23 03/15/2019   GLUCOSE 110 (H) 03/15/2019   BUN 20 03/15/2019   CREATININE 0.85 03/15/2019   BILITOT 0.4 03/15/2019   ALKPHOS 74 03/15/2019   AST 14 03/15/2019   ALT 14 03/15/2019   PROT 6.7 03/15/2019   ALBUMIN 4.1 03/15/2019   CALCIUM 9.2 03/15/2019   ANIONGAP 11 08/09/2013   GFR 66.49 03/15/2019   Lab Results  Component Value Date   CHOL 187 09/24/2018   Lab Results  Component Value Date   HDL 75.70 09/24/2018   Lab Results  Component Value Date   LDLCALC 86 09/24/2018   Lab Results  Component Value Date   TRIG 123.0 09/24/2018   Lab Results  Component Value Date   CHOLHDL 2 09/24/2018  No results found for: HGBA1C    Assessment & Plan:   Problem List Items Addressed This Visit      Cardiovascular and Mediastinum   Essential hypertension   Relevant Orders   CBC   Comprehensive metabolic panel     Nervous and Auditory   Neuropathy   Relevant Orders   Vitamin B12     Other   Spinal stenosis of lumbar region with neurogenic claudication   Relevant Medications   citalopram (CELEXA) 20 MG tablet   methocarbamol (ROBAXIN) 500 MG tablet   Elevated cholesterol   Relevant Orders   Comprehensive metabolic panel   LDL cholesterol, direct   Lipid panel   B12 deficiency   Relevant Orders   Vitamin B12   Depression with anxiety - Primary   Relevant Medications   citalopram (CELEXA) 20 MG  tablet      Meds ordered this encounter  Medications  . citalopram (CELEXA) 20 MG tablet    Sig: Take 1 tablet (20 mg total) by mouth daily.    Dispense:  90 tablet    Refill:  1  . methocarbamol (ROBAXIN) 500 MG tablet    Sig: TAKE ONE TABLET BY MOUTH EVERY 8 HOURS AS NEEDED FOR MUSCLE SPASMS    Dispense:  60 tablet    Refill:  0    Follow-up: Return in about 6 months (around 06/13/2020).   Encouraged patient to continue her healthy lifestyle.  Reminded her that we are treating fatty liver with exercise weight loss cholesterol control and responsible alcohol intake. Libby Maw, MD

## 2020-01-02 ENCOUNTER — Other Ambulatory Visit: Payer: Self-pay | Admitting: Family Medicine

## 2020-01-02 DIAGNOSIS — M25541 Pain in joints of right hand: Secondary | ICD-10-CM

## 2020-01-02 DIAGNOSIS — M25511 Pain in right shoulder: Secondary | ICD-10-CM

## 2020-01-31 ENCOUNTER — Ambulatory Visit (INDEPENDENT_AMBULATORY_CARE_PROVIDER_SITE_OTHER): Payer: Medicare Other

## 2020-01-31 VITALS — Ht 66.0 in | Wt 152.0 lb

## 2020-01-31 DIAGNOSIS — Z0000 Encounter for general adult medical examination without abnormal findings: Secondary | ICD-10-CM | POA: Diagnosis not present

## 2020-01-31 NOTE — Progress Notes (Signed)
Subjective:   Michele Hanson is a 69 y.o. female who presents for Medicare Annual (Subsequent) preventive examination.  I connected with Analilia today by telephone and verified that I am speaking with the correct person using two identifiers. Location patient: home Location provider: work Persons participating in the virtual visit: patient, Marine scientist.    I discussed the limitations, risks, security and privacy concerns of performing an evaluation and management service by telephone and the availability of in person appointments. I also discussed with the patient that there may be a patient responsible charge related to this service. The patient expressed understanding and verbally consented to this telephonic visit.    Interactive audio and video telecommunications were attempted between this provider and patient, however failed, due to patient having technical difficulties OR patient did not have access to video capability.  We continued and completed visit with audio only.  Some vital signs may be absent or patient reported.   Time Spent with patient on telephone encounter: 25 minutes  Review of Systems     Cardiac Risk Factors include: advanced age (>54men, >73 women);hypertension;dyslipidemia     Objective:    Today's Vitals   01/31/20 1115  Weight: 152 lb (68.9 kg)  Height: 5\' 6"  (1.676 m)   Body mass index is 24.53 kg/m.  Advanced Directives 01/31/2020 01/26/2019  Does Patient Have a Medical Advance Directive? No No  Would patient like information on creating a medical advance directive? No - Patient declined No - Patient declined    Current Medications (verified) Outpatient Encounter Medications as of 01/31/2020  Medication Sig  . atorvastatin (LIPITOR) 20 MG tablet TAKE ONE TABLET BY MOUTH ONE TIME DAILY  . azelastine (ASTELIN) 0.1 % nasal spray USE ONE SPRAY INTO BOTH NOSTRILS AS NEEDED  . citalopram (CELEXA) 20 MG tablet Take 1 tablet (20 mg total) by mouth daily.   . diclofenac Sodium (VOLTAREN) 1 % GEL Apply 2 g topically 4 (four) times daily.  Marland Kitchen estradiol (ESTRACE) 0.1 MG/GM vaginal cream   . fluticasone (FLONASE) 50 MCG/ACT nasal spray USE ONE SPRAY IN EACH NOSTRIL DAILY **SHAKE BEFORE USING**  . gabapentin (NEURONTIN) 300 MG capsule TAKE ONE CAPSULE BY MOUTH TWICE A DAY  . lisinopril-hydrochlorothiazide (ZESTORETIC) 20-12.5 MG tablet TAKE ONE TABLET BY MOUTH ONE TIME DAILY  . meloxicam (MOBIC) 15 MG tablet Take 15 mg by mouth daily.  . methocarbamol (ROBAXIN) 500 MG tablet TAKE ONE TABLET BY MOUTH EVERY 8 HOURS AS NEEDED FOR MUSCLE SPASMS  . omeprazole (PRILOSEC) 20 MG capsule TAKE ONE CAPSULE BY MOUTH ONE TIME DAILY  . traMADol (ULTRAM) 50 MG tablet TAKE ONE TABLET BY MOUTH EVERY 8 HOURS AS NEEDED FOR SEVERE PAIN FOR UP TO 5 DAYS  . Vitamin D, Ergocalciferol, (DRISDOL) 1.25 MG (50000 UNIT) CAPS capsule Take 50,000 Units by mouth every 7 (seven) days.   No facility-administered encounter medications on file as of 01/31/2020.    Allergies (verified) Ciprofloxacin, Neomycin, and Nickel   History: Past Medical History:  Diagnosis Date  . GERD (gastroesophageal reflux disease)   . Hyperlipemia   . Hypertension    Past Surgical History:  Procedure Laterality Date  . CHOLECYSTECTOMY     Family History  Family history unknown: Yes   Social History   Socioeconomic History  . Marital status: Married    Spouse name: Not on file  . Number of children: Not on file  . Years of education: Not on file  . Highest education level: Not on file  Occupational History  . Not on file  Tobacco Use  . Smoking status: Never Smoker  . Smokeless tobacco: Never Used  Substance and Sexual Activity  . Alcohol use: Yes    Comment: 1-2 alcoholic drinks daily  . Drug use: Never  . Sexual activity: Not on file  Other Topics Concern  . Not on file  Social History Narrative  . Not on file   Social Determinants of Health   Financial Resource Strain:  Low Risk   . Difficulty of Paying Living Expenses: Not hard at all  Food Insecurity: No Food Insecurity  . Worried About Charity fundraiser in the Last Year: Never true  . Ran Out of Food in the Last Year: Never true  Transportation Needs: No Transportation Needs  . Lack of Transportation (Medical): No  . Lack of Transportation (Non-Medical): No  Physical Activity: Sufficiently Active  . Days of Exercise per Week: 7 days  . Minutes of Exercise per Session: 30 min  Stress: No Stress Concern Present  . Feeling of Stress : Not at all  Social Connections: Moderately Integrated  . Frequency of Communication with Friends and Family: More than three times a week  . Frequency of Social Gatherings with Friends and Family: Once a week  . Attends Religious Services: Never  . Active Member of Clubs or Organizations: Yes  . Attends Archivist Meetings: 1 to 4 times per year  . Marital Status: Married    Tobacco Counseling Counseling given: Not Answered   Clinical Intake:  Pre-visit preparation completed: Yes  Pain : No/denies pain     Nutritional Status: BMI of 19-24  Normal Nutritional Risks: None Diabetes: No  How often do you need to have someone help you when you read instructions, pamphlets, or other written materials from your doctor or pharmacy?: 1 - Never What is the last grade level you completed in school?: Master's Degree  Diabetic?No  Interpreter Needed?: No  Information entered by :: Caroleen Hamman LPN   Activities of Daily Living In your present state of health, do you have any difficulty performing the following activities: 01/31/2020  Hearing? N  Vision? N  Difficulty concentrating or making decisions? N  Walking or climbing stairs? N  Dressing or bathing? N  Doing errands, shopping? N  Preparing Food and eating ? N  Using the Toilet? N  In the past six months, have you accidently leaked urine? N  Do you have problems with loss of bowel  control? N  Managing your Medications? N  Managing your Finances? N  Housekeeping or managing your Housekeeping? N  Some recent data might be hidden    Patient Care Team: Libby Maw, MD as PCP - General (Family Medicine)  Indicate any recent Medical Services you may have received from other than [facility] providers in the past year (date may be approximate).     Assessment:   This is a routine wellness examination for Hatley.  Hearing/Vision screen  Hearing Screening   125Hz  250Hz  500Hz  1000Hz  2000Hz  3000Hz  4000Hz  6000Hz  8000Hz   Right ear:           Left ear:           Vision Screening Comments: Wears glasses Last eye exam 2020-[facility]  Dietary issues and exercise activities discussed: Current Exercise Habits: Home exercise routine, Type of exercise: walking (pickle ball), Time (Minutes): 30, Frequency (Times/Week): 7, Weekly Exercise (Minutes/Week): 210, Intensity: Mild, Exercise limited by: None identified  Goals    . Patient Stated     Maintain healthy active lifestyle.     . Patient Stated     Lose weight with Noom diet & increasing activity      Depression Screen PHQ 2/9 Scores 01/31/2020 12/15/2019 06/14/2019 03/15/2019 01/26/2019  PHQ - 2 Score 0 0 3 2 0  PHQ- 9 Score - 1 5 4  -    Fall Risk Fall Risk  01/31/2020 10/03/2019 06/14/2019 01/26/2019  Falls in the past year? 1 - 0 1  Number falls in past yr: 1 1 1 1   Injury with Fall? 0 0 0 0  Comment - - little scrape -  Risk for fall due to : History of fall(s) - - -  Follow up Falls prevention discussed - - -    Any stairs in or around the home? Yes  If so, are there any without handrails? No  Home free of loose throw rugs in walkways, pet beds, electrical cords, etc? Yes  Adequate lighting in your home to reduce risk of falls? Yes   ASSISTIVE DEVICES UTILIZED TO PREVENT FALLS:  Life alert? No  Use of a cane, walker or w/c? No  Grab bars in the bathroom? No  Shower chair or bench in shower?  No  Elevated toilet seat or a handicapped toilet? No   TIMED UP AND GO:  Was the test performed? No . Phone visit   Cognitive Function:No cognitive impairment noted        Immunizations Immunization History  Administered Date(s) Administered  . Fluad Quad(high Dose 65+) 01/18/2019  . Influenza, High Dose Seasonal PF 03/16/2018  . PFIZER SARS-COV-2 Vaccination 07/28/2019, 08/18/2019  . Tdap 04/13/2018    TDAP status: Up to date   Flu vaccine status: Due- Patient plans to get vaccine soon.  Pneumococcal vaccine status: Due for Prevnar-13-Patient plans to get vaccine soon.  Covid-19 vaccine status: Completed vaccines  Qualifies for Shingles Vaccine? Yes   Zostavax completed No   Shingrix Completed?: No.    Education has been provided regarding the importance of this vaccine. Patient has been advised to call insurance company to determine out of pocket expense if they have not yet received this vaccine. Advised may also receive vaccine at local pharmacy or Health Dept. Verbalized acceptance and understanding.  Screening Tests Health Maintenance  Topic Date Due  . MAMMOGRAM  Never done  . PNA vac Low Risk Adult (1 of 2 - PCV13) Never done  . INFLUENZA VACCINE  11/13/2019  . COLONOSCOPY  02/18/2023  . TETANUS/TDAP  04/13/2028  . DEXA SCAN  Completed  . COVID-19 Vaccine  Completed  . Hepatitis C Screening  Completed    Health Maintenance  Health Maintenance Due  Topic Date Due  . MAMMOGRAM  Never done  . PNA vac Low Risk Adult (1 of 2 - PCV13) Never done  . INFLUENZA VACCINE  11/13/2019    Colorectal cancer screening: Completed 02/17/2013. Repeat every 10 years   Mammogram status: Patient states she has had a mammogram in the last year. Done at GYN office. Requested patient have copy of results sent to PCP.  Bone Density status:Patient states she has had a bone density scan in the last year.  Requested patient have copy of results sent to PCP.  Lung Cancer  Screening: (Low Dose CT Chest recommended if Age 46-80 years, 30 pack-year currently smoking OR have quit w/in 15years.) does not qualify.     Additional Screening:  Hepatitis C  Screening:Completed 03/15/2019  Vision Screening: Recommended annual ophthalmology exams for early detection of glaucoma and other disorders of the eye. Is the patient up to date with their annual eye exam?  Yes  Who is the provider or what is the name of the office in which the patient attends annual eye exams? [facility] Screening: Recommended annual dental exams for proper oral hygiene  Community Resource Referral / Chronic Care Management: CRR required this visit?  No   CCM required this visit?  No      Plan:     I have personally reviewed and noted the following in the patient's chart:   . Medical and social history . Use of alcohol, tobacco or illicit drugs  . Current medications and supplements . Functional ability and status . Nutritional status . Physical activity . Advanced directives . List of other physicians . Hospitalizations, surgeries, and ER visits in previous 12 months . Vitals . Screenings to include cognitive, depression, and falls . Referrals and appointments  In addition, I have reviewed and discussed with patient certain preventive protocols, quality metrics, and best practice recommendations. A written personalized care plan for preventive services as well as general preventive health recommendations were provided to patient.    Due to this being a telephonic visit, the after visit summary with patients personalized plan was offered to patient via mail or my-chart. Patient would like to access on my-chart.   Marta Antu, LPN   [id number]  Nurse Health Advisor  Nurse Notes: None

## 2020-01-31 NOTE — Patient Instructions (Signed)
Michele Hanson , Thank you for taking time to complete your Medicare Wellness Visit. I appreciate your ongoing commitment to your health goals. Please review the following plan we discussed and let me know if I can assist you in the future.   Screening recommendations/referrals: Colonoscopy: Completed 02/17/2013- Due-02/18/2023 Mammogram: Per our conversation completed within last year. Please have a copy of results sent to Dr. Ethelene Hal. Bone Density:Per our conversation completed within last year. Please have a copy of results sent to Dr. Ethelene Hal.  Recommended yearly ophthalmology/optometry visit for glaucoma screening and checkup Recommended yearly dental visit for hygiene and checkup  Vaccinations: Influenza vaccine: Due- May obtain vaccine at our office or your local pharmacy. Pneumococcal vaccine: [id number] due- May obtain vaccine at our office or your local pharmacy. Tdap vaccine: Per our conversation, up to date. Due-03/2028 Shingles vaccine: Discuss with pharmacy Covid-19:Completed vaccines  Advanced directives: Declined information.  Conditions/risks identified: See problem list  Next appointment: Follow up in one year for your annual wellness visit 02/05/2021 @ 11:15am.   Preventive Care 65 Years and Older, Female Preventive care refers to lifestyle choices and visits with your health care provider that can promote health and wellness. What does preventive care include?  A yearly physical exam. This is also called an annual well check.  Dental exams once or twice a year.  Routine eye exams. Ask your health care provider how often you should have your eyes checked.  Personal lifestyle choices, including:  Daily care of your teeth and gums.  Regular physical activity.  Eating a healthy diet.  Avoiding tobacco and drug use.  Limiting alcohol use.  Practicing safe sex.  Taking low-dose aspirin every day.  Taking vitamin and mineral supplements as recommended by your  health care provider. What happens during an annual well check? The services and screenings done by your health care provider during your annual well check will depend on your age, overall health, lifestyle risk factors, and family history of disease. Counseling  Your health care provider may ask you questions about your:  Alcohol use.  Tobacco use.  Drug use.  Emotional well-being.  Home and relationship well-being.  Sexual activity.  Eating habits.  History of falls.  Memory and ability to understand (cognition).  Work and work Statistician.  Reproductive health. Screening  You may have the following tests or measurements:  Height, weight, and BMI.  Blood pressure.  Lipid and cholesterol levels. These may be checked every 5 years, or more frequently if you are over 53 years old.  Skin check.  Lung cancer screening. You may have this screening every year starting at age 9 if you have a 30-pack-year history of smoking and currently smoke or have quit within the past 15 years.  Fecal occult blood test (FOBT) of the stool. You may have this test every year starting at age 58.  Flexible sigmoidoscopy or colonoscopy. You may have a sigmoidoscopy every 5 years or a colonoscopy every 10 years starting at age 46.  Hepatitis C blood test.  Hepatitis B blood test.  Sexually transmitted disease (STD) testing.  Diabetes screening. This is done by checking your blood sugar (glucose) after you have not eaten for a while (fasting). You may have this done every 1-3 years.  Bone density scan. This is done to screen for osteoporosis. You may have this done starting at age 17.  Mammogram. This may be done every 1-2 years. Talk to your health care provider about how often you should have  regular mammograms. Talk with your health care provider about your test results, treatment options, and if necessary, the need for more tests. Vaccines  Your health care provider may recommend  certain vaccines, such as:  Influenza vaccine. This is recommended every year.  Tetanus, diphtheria, and acellular pertussis (Tdap, Td) vaccine. You may need a Td booster every 10 years.  Zoster vaccine. You may need this after age 31.  Pneumococcal 13-valent conjugate (PCV13) vaccine. One dose is recommended after age 70.  Pneumococcal polysaccharide (PPSV23) vaccine. One dose is recommended after age 75. Talk to your health care provider about which screenings and vaccines you need and how often you need them. This information is not intended to replace advice given to you by your health care provider. Make sure you discuss any questions you have with your health care provider. Document Released: 04/27/2015 Document Revised: 12/19/2015 Document Reviewed: 01/30/2015 Elsevier Interactive Patient Education  2017 [place] Prevention in the Home Falls can cause injuries. They can happen to people of all ages. There are many things you can do to make your home safe and to help prevent falls. What can I do on the outside of my home?  Regularly fix the edges of walkways and driveways and fix any cracks.  Remove anything that might make you trip as you walk through a door, such as a raised step or threshold.  Trim any bushes or trees on the path to your home.  Use bright outdoor lighting.  Clear any walking paths of anything that might make someone trip, such as rocks or tools.  Regularly check to see if handrails are loose or broken. Make sure that both sides of any steps have handrails.  Any raised decks and porches should have guardrails on the edges.  Have any leaves, snow, or ice cleared regularly.  Use sand or salt on walking paths during winter.  Clean up any spills in your garage right away. This includes oil or grease spills. What can I do in the bathroom?  Use night lights.  Install grab bars by the toilet and in the tub and shower. Do not use towel bars as  grab bars.  Use non-skid mats or decals in the tub or shower.  If you need to sit down in the shower, use a plastic, non-slip stool.  Keep the floor dry. Clean up any water that spills on the floor as soon as it happens.  Remove soap buildup in the tub or shower regularly.  Attach bath mats securely with double-sided non-slip rug tape.  Do not have throw rugs and other things on the floor that can make you trip. What can I do in the bedroom?  Use night lights.  Make sure that you have a light by your bed that is easy to reach.  Do not use any sheets or blankets that are too big for your bed. They should not hang down onto the floor.  Have a firm chair that has side arms. You can use this for support while you get dressed.  Do not have throw rugs and other things on the floor that can make you trip. What can I do in the kitchen?  Clean up any spills right away.  Avoid walking on wet floors.  Keep items that you use a lot in easy-to-reach places.  If you need to reach something above you, use a strong step stool that has a grab bar.  Keep electrical cords out of the  way.  Do not use floor polish or wax that makes floors slippery. If you must use wax, use non-skid floor wax.  Do not have throw rugs and other things on the floor that can make you trip. What can I do with my stairs?  Do not leave any items on the stairs.  Make sure that there are handrails on both sides of the stairs and use them. Fix handrails that are broken or loose. Make sure that handrails are as long as the stairways.  Check any carpeting to make sure that it is firmly attached to the stairs. Fix any carpet that is loose or worn.  Avoid having throw rugs at the top or bottom of the stairs. If you do have throw rugs, attach them to the floor with carpet tape.  Make sure that you have a light switch at the top of the stairs and the bottom of the stairs. If you do not have them, ask someone to add them  for you. What else can I do to help prevent falls?  Wear shoes that:  Do not have high heels.  Have rubber bottoms.  Are comfortable and fit you well.  Are closed at the toe. Do not wear sandals.  If you use a stepladder:  Make sure that it is fully opened. Do not climb a closed stepladder.  Make sure that both sides of the stepladder are locked into place.  Ask someone to hold it for you, if possible.  Clearly mark and make sure that you can see:  Any grab bars or handrails.  First and last steps.  Where the edge of each step is.  Use tools that help you move around (mobility aids) if they are needed. These include:  Canes.  Walkers.  Scooters.  Crutches.  Turn on the lights when you go into a dark area. Replace any light bulbs as soon as they burn out.  Set up your furniture so you have a clear path. Avoid moving your furniture around.  If any of your floors are uneven, fix them.  If there are any pets around you, be aware of where they are.  Review your medicines with your doctor. Some medicines can make you feel dizzy. This can increase your chance of falling. Ask your doctor what other things that you can do to help prevent falls. This information is not intended to replace advice given to you by your health care provider. Make sure you discuss any questions you have with your health care provider. Document Released: 01/25/2009 Document Revised: 09/06/2015 Document Reviewed: 05/05/2014 Elsevier Interactive Patient Education  2017 [employer].

## 2020-03-01 ENCOUNTER — Other Ambulatory Visit: Payer: Self-pay | Admitting: Family Medicine

## 2020-03-05 ENCOUNTER — Other Ambulatory Visit: Payer: Self-pay | Admitting: Family Medicine

## 2020-03-14 ENCOUNTER — Other Ambulatory Visit: Payer: Self-pay | Admitting: Family Medicine

## 2020-03-14 ENCOUNTER — Encounter: Payer: Self-pay | Admitting: Family Medicine

## 2020-03-14 DIAGNOSIS — M25542 Pain in joints of left hand: Secondary | ICD-10-CM

## 2020-03-14 DIAGNOSIS — M25541 Pain in joints of right hand: Secondary | ICD-10-CM

## 2020-03-14 DIAGNOSIS — G8929 Other chronic pain: Secondary | ICD-10-CM

## 2020-03-14 NOTE — Telephone Encounter (Signed)
Refill request on pending medication last OV 12/15/18 last refill 01/02/20. Please advise

## 2020-04-02 ENCOUNTER — Other Ambulatory Visit: Payer: Self-pay | Admitting: Family Medicine

## 2020-04-02 DIAGNOSIS — M48062 Spinal stenosis, lumbar region with neurogenic claudication: Secondary | ICD-10-CM

## 2020-04-02 NOTE — Telephone Encounter (Signed)
Please see message and advise.  Thank you. Last OV 12/15/19 Last fill 12/15/19  #60/0

## 2020-04-04 ENCOUNTER — Other Ambulatory Visit: Payer: Self-pay | Admitting: Family Medicine

## 2020-04-04 DIAGNOSIS — I10 Essential (primary) hypertension: Secondary | ICD-10-CM

## 2020-06-14 ENCOUNTER — Other Ambulatory Visit: Payer: Self-pay

## 2020-06-14 MED ORDER — OMEPRAZOLE 20 MG PO CPDR: 20.0000 mg | DELAYED_RELEASE_CAPSULE | Freq: Every day | ORAL | 0 refills | Status: DC

## 2020-06-25 ENCOUNTER — Other Ambulatory Visit: Payer: Self-pay | Admitting: Family Medicine

## 2020-06-25 DIAGNOSIS — M25542 Pain in joints of left hand: Secondary | ICD-10-CM

## 2020-06-25 DIAGNOSIS — M25511 Pain in right shoulder: Secondary | ICD-10-CM

## 2020-06-25 DIAGNOSIS — M25541 Pain in joints of right hand: Secondary | ICD-10-CM

## 2020-06-25 DIAGNOSIS — G8929 Other chronic pain: Secondary | ICD-10-CM

## 2020-08-27 ENCOUNTER — Telehealth: Payer: Self-pay | Admitting: Family Medicine

## 2020-08-27 DIAGNOSIS — M48062 Spinal stenosis, lumbar region with neurogenic claudication: Secondary | ICD-10-CM

## 2020-08-27 NOTE — Telephone Encounter (Signed)
Chelsea w/[facility] at [facility]  methocarbamol (ROBAXIN) 500 MG tablet   Pt requested refill. Chelsea states it has been faxed 2x w/in 10 days. Please advise on refills.

## 2020-08-28 MED ORDER — METHOCARBAMOL 500 MG PO TABS: ORAL_TABLET | ORAL | 0 refills | Status: DC

## 2020-08-28 NOTE — Telephone Encounter (Signed)
Rx sent in

## 2020-09-13 ENCOUNTER — Other Ambulatory Visit: Payer: Self-pay | Admitting: Family Medicine

## 2020-09-13 DIAGNOSIS — J301 Allergic rhinitis due to pollen: Secondary | ICD-10-CM

## 2020-09-18 ENCOUNTER — Other Ambulatory Visit: Payer: Self-pay | Admitting: Orthopaedic Surgery

## 2020-09-18 DIAGNOSIS — M25511 Pain in right shoulder: Secondary | ICD-10-CM

## 2020-09-19 ENCOUNTER — Ambulatory Visit
Admission: RE | Admit: 2020-09-19 | Discharge: 2020-09-19 | Disposition: A | Discharge status: Home / Self Care | Payer: Medicare Other | Source: Ambulatory Visit | Attending: Orthopaedic Surgery | Admitting: Orthopaedic Surgery

## 2020-09-19 DIAGNOSIS — M25511 Pain in right shoulder: Secondary | ICD-10-CM

## 2020-10-02 ENCOUNTER — Other Ambulatory Visit: Payer: Self-pay | Admitting: Family Medicine

## 2020-10-02 DIAGNOSIS — F418 Other specified anxiety disorders: Secondary | ICD-10-CM

## 2020-10-02 DIAGNOSIS — G629 Polyneuropathy, unspecified: Secondary | ICD-10-CM

## 2020-10-05 ENCOUNTER — Other Ambulatory Visit: Payer: Self-pay | Admitting: Family Medicine

## 2020-10-05 DIAGNOSIS — F418 Other specified anxiety disorders: Secondary | ICD-10-CM

## 2020-10-05 DIAGNOSIS — G629 Polyneuropathy, unspecified: Secondary | ICD-10-CM

## 2020-10-08 ENCOUNTER — Telehealth: Payer: Self-pay

## 2020-10-08 ENCOUNTER — Other Ambulatory Visit: Payer: Self-pay | Admitting: Family Medicine

## 2020-10-08 DIAGNOSIS — G8929 Other chronic pain: Secondary | ICD-10-CM

## 2020-10-08 DIAGNOSIS — I10 Essential (primary) hypertension: Secondary | ICD-10-CM

## 2020-10-08 DIAGNOSIS — M25541 Pain in joints of right hand: Secondary | ICD-10-CM

## 2020-10-08 MED ORDER — LISINOPRIL-HYDROCHLOROTHIAZIDE 20-12.5 MG PO TABS: 1.0000 | ORAL_TABLET | Freq: Every day | ORAL | 0 refills | Status: DC

## 2020-10-08 NOTE — Telephone Encounter (Signed)
Last fll 04/05/20  #90/1 Last OV 12/15/19 Pt need an office visit before any other refills.

## 2020-11-01 ENCOUNTER — Other Ambulatory Visit: Payer: Self-pay | Admitting: Family Medicine

## 2020-11-01 DIAGNOSIS — E7800 Pure hypercholesterolemia, unspecified: Secondary | ICD-10-CM

## 2020-11-05 ENCOUNTER — Other Ambulatory Visit: Payer: Self-pay | Admitting: Family Medicine

## 2020-11-05 DIAGNOSIS — E7800 Pure hypercholesterolemia, unspecified: Secondary | ICD-10-CM

## 2020-12-03 ENCOUNTER — Other Ambulatory Visit: Payer: Self-pay | Admitting: Family Medicine

## 2020-12-03 DIAGNOSIS — I10 Essential (primary) hypertension: Secondary | ICD-10-CM

## 2020-12-10 ENCOUNTER — Telehealth: Payer: Self-pay | Admitting: Family Medicine

## 2020-12-10 NOTE — Telephone Encounter (Signed)
Pt called, has sore throat, body aches, no fever, negative covid home tests. Wondering if there is an antibiotic she can take. I told her she will probably need an appointment sched.

## 2020-12-13 ENCOUNTER — Encounter: Payer: Self-pay | Admitting: Family Medicine

## 2020-12-13 ENCOUNTER — Telehealth (INDEPENDENT_AMBULATORY_CARE_PROVIDER_SITE_OTHER): Payer: Medicare Other | Admitting: Family Medicine

## 2020-12-13 VITALS — Ht 66.0 in | Wt 155.0 lb

## 2020-12-13 DIAGNOSIS — Z0000 Encounter for general adult medical examination without abnormal findings: Secondary | ICD-10-CM | POA: Diagnosis not present

## 2020-12-13 DIAGNOSIS — E7800 Pure hypercholesterolemia, unspecified: Secondary | ICD-10-CM

## 2020-12-13 DIAGNOSIS — E538 Deficiency of other specified B group vitamins: Secondary | ICD-10-CM | POA: Diagnosis not present

## 2020-12-13 DIAGNOSIS — U071 COVID-19: Secondary | ICD-10-CM

## 2020-12-13 NOTE — Progress Notes (Signed)
Established Patient Office Visit  Subjective:  Patient ID: Michele Hanson, female    DOB: June 28, 1950  Age: 70 y.o. MRN: [id number]  CC:  Chief Complaint  Patient presents with   Covid Positive    Positive for covid symptoms have subsided patient still has a cough, feeling tired little aches.     HPI Michele Hanson presents for 5 day ho URI symptoms. Negative home tests followed by positive PCR test 3 days ago. Reports malaise, sorethroat, fatigue, sweats.  Little cough. Loose stool. Without loss of taste or smell. Husband not affected. He had Covid earlier this year.  She is improving and feels as though her recovery is going well.  Has an appointment for physical with me in a few weeks.  Past Medical History:  Diagnosis Date   GERD (gastroesophageal reflux disease)    Hyperlipemia    Hypertension     Past Surgical History:  Procedure Laterality Date   CHOLECYSTECTOMY      Family History  Family history unknown: Yes    Social History   Socioeconomic History   Marital status: Married    Spouse name: Not on file   Number of children: Not on file   Years of education: Not on file   Highest education level: Not on file  Occupational History   Not on file  Tobacco Use   Smoking status: Never   Smokeless tobacco: Never  Substance and Sexual Activity   Alcohol use: Yes    Comment: 1-2 alcoholic drinks daily   Drug use: Never   Sexual activity: Not on file  Other Topics Concern   Not on file  Social History Narrative   Not on file   Social Determinants of Health   Financial Resource Strain: Low Risk    Difficulty of Paying Living Expenses: Not hard at all  Food Insecurity: No Food Insecurity   Worried About Charity fundraiser in the Last Year: Never true   [place] in the Last Year: Never true  Transportation Needs: No Transportation Needs   Lack of Transportation (Medical): No   Lack of Transportation (Non-Medical): No  Physical Activity:  Sufficiently Active   Days of Exercise per Week: 7 days   Minutes of Exercise per Session: 30 min  Stress: No Stress Concern Present   Feeling of Stress : Not at all  Social Connections: Moderately Integrated   Frequency of Communication with Friends and Family: More than three times a week   Frequency of Social Gatherings with Friends and Family: Once a week   Attends Religious Services: Never   Marine scientist or Organizations: Yes   Attends Music therapist: 1 to 4 times per year   Marital Status: Married  Human resources officer Violence: Not At Risk   Fear of Current or Ex-Partner: No   Emotionally Abused: No   Physically Abused: No   Sexually Abused: No    Outpatient Medications Prior to Visit  Medication Sig Dispense Refill   atorvastatin (LIPITOR) 20 MG tablet TAKE ONE TABLET BY MOUTH ONE TIME DAILY 90 tablet 3   azelastine (ASTELIN) 0.1 % nasal spray USE ONE SPRAY INTO BOTH NOSTRILS AS NEEDED 30 mL 5   citalopram (CELEXA) 20 MG tablet TAKE ONE TABLET BY MOUTH ONE TIME DAILY 90 tablet 1   estradiol (ESTRACE) 0.1 MG/GM vaginal cream      fluticasone (FLONASE) 50 MCG/ACT nasal spray USE ONE SPRAY IN EACH NOSTRIL DAILY **SHAKE BEFORE  USING** 16 mL 4   gabapentin (NEURONTIN) 300 MG capsule TAKE ONE CAPSULE BY MOUTH TWICE A DAY 180 capsule 1   lisinopril-hydrochlorothiazide (ZESTORETIC) 20-12.5 MG tablet TAKE ONE TABLET BY MOUTH ONE TIME DAILY 30 tablet 0   meloxicam (MOBIC) 15 MG tablet TAKE ONE TABLET BY MOUTH ONE TIME DAILY FOR 10 DAYS THEN TAKE AS NEEDED 30 tablet 1   methocarbamol (ROBAXIN) 500 MG tablet TAKE ONE TABLET BY MOUTH EVERY 8 HOURS AS NEEDED FOR MUSCLE SPASMS 60 tablet 0   omeprazole (PRILOSEC) 20 MG capsule Take 1 capsule (20 mg total) by mouth daily. 90 capsule 0   traMADol (ULTRAM) 50 MG tablet TAKE ONE TABLET BY MOUTH EVERY 8 HOURS AS NEEDED FOR SEVERE PAIN FOR UP TO 5 DAYS 30 tablet 0   Vitamin D, Ergocalciferol, (DRISDOL) 1.25 MG (50000 UNIT) CAPS  capsule Take 50,000 Units by mouth every 7 (seven) days.     diclofenac Sodium (VOLTAREN) 1 % GEL Apply 2 g topically 4 (four) times daily. (Patient not taking: Reported on 12/13/2020)     No facility-administered medications prior to visit.    Allergies  Allergen Reactions   Ciprofloxacin Other (See Comments)    Neuropathy in feet   Neomycin Itching    Eye drops   Nickel Itching and Rash    ROS Review of Systems  Constitutional:  Positive for diaphoresis and fatigue. Negative for chills and fever.  HENT:  Positive for sore throat. Negative for postnasal drip.   Eyes:  Negative for photophobia and visual disturbance.  Respiratory:  Positive for cough. Negative for shortness of breath and wheezing.   Gastrointestinal:  Positive for diarrhea.  Genitourinary: Negative.   Musculoskeletal:  Positive for arthralgias and myalgias.  Neurological:  Positive for headaches.     Objective:    Physical Exam Vitals and nursing note reviewed.  Constitutional:      General: She is not in acute distress.    Appearance: Normal appearance. She is not ill-appearing, toxic-appearing or diaphoretic.  HENT:     Head: Normocephalic and atraumatic.  Pulmonary:     Effort: Pulmonary effort is normal.  Neurological:     Mental Status: She is alert and oriented to person, place, and time.  Psychiatric:        Mood and Affect: Mood normal.        Behavior: Behavior normal.    Ht '5\' 6"'$  (1.676 m)   Wt 155 lb (70.3 kg)   BMI 25.02 kg/m  Wt Readings from Last 3 Encounters:  12/13/20 155 lb (70.3 kg)  01/31/20 152 lb (68.9 kg)  12/15/19 152 lb 6.4 oz (69.1 kg)     Health Maintenance Due  Topic Date Due   MAMMOGRAM  Never done   PNA vac Low Risk Adult (1 of 2 - PCV13) Never done   Zoster Vaccines- Shingrix (2 of 2) 05/09/2020   INFLUENZA VACCINE  11/12/2020    There are no preventive care reminders to display for this patient.  Lab Results  Component Value Date   TSH 1.37 07/15/2017    Lab Results  Component Value Date   WBC 6.0 12/15/2019   HGB 13.1 12/15/2019   HCT 39.4 12/15/2019   MCV 92.2 12/15/2019   PLT 176.0 12/15/2019   Lab Results  Component Value Date   NA 140 12/15/2019   K 4.1 12/15/2019   CHLORIDE 104 08/09/2013   CO2 27 12/15/2019   GLUCOSE 94 12/15/2019   BUN 19 12/15/2019  CREATININE 0.96 12/15/2019   BILITOT 0.5 12/15/2019   ALKPHOS 58 12/15/2019   AST 14 12/15/2019   ALT 12 12/15/2019   PROT 6.6 12/15/2019   ALBUMIN 4.4 12/15/2019   CALCIUM 9.2 12/15/2019   ANIONGAP 11 08/09/2013   GFR 57.65 (L) 12/15/2019   Lab Results  Component Value Date   CHOL 182 12/15/2019   Lab Results  Component Value Date   HDL 84.30 12/15/2019   Lab Results  Component Value Date   LDLCALC 84 12/15/2019   Lab Results  Component Value Date   TRIG 69.0 12/15/2019   Lab Results  Component Value Date   CHOLHDL 2 12/15/2019   No results found for: HGBA1C    Assessment & Plan:   Problem List Items Addressed This Visit       Other   Elevated cholesterol   Relevant Orders   Comprehensive metabolic panel   Lipid panel   Healthcare maintenance   Relevant Orders   CBC   Urinalysis, Routine w reflex microscopic   B12 deficiency   Relevant Orders   Vitamin B12   Other Visit Diagnoses     COVID-19    -  Primary       No orders of the defined types were placed in this encounter.   Follow-up: Return if symptoms worsen or fail to improve.  physical with me in a few weeks.  Ordered lab work for that.  She will let me know if she does not continue to improve.  Advised flu vaccine.  Libby Maw, MD  Virtual Visit via Video Note  I connected with Michele Hanson on 12/13/20 at  9:30 AM EDT by a video enabled telemedicine application and verified that I am speaking with the correct person using two identifiers.  Location: Patient: home alone.  Provider: work   I discussed the limitations of evaluation and management by  telemedicine and the availability of in person appointments. The patient expressed understanding and agreed to proceed.  History of Present Illness:    Observations/Objective:   Assessment and Plan:   Follow Up Instructions:    I discussed the assessment and treatment plan with the patient. The patient was provided an opportunity to ask questions and all were answered. The patient agreed with the plan and demonstrated an understanding of the instructions.   The patient was advised to call back or seek an in-person evaluation if the symptoms worsen or if the condition fails to improve as anticipated.  I provided 20 minutes of non-face-to-face time during this encounter.   Libby Maw, MD

## 2020-12-20 ENCOUNTER — Encounter: Payer: Medicare Other | Admitting: Family Medicine

## 2020-12-26 ENCOUNTER — Other Ambulatory Visit: Payer: Self-pay

## 2020-12-27 ENCOUNTER — Encounter: Payer: Self-pay | Admitting: Family Medicine

## 2020-12-27 ENCOUNTER — Ambulatory Visit (INDEPENDENT_AMBULATORY_CARE_PROVIDER_SITE_OTHER): Payer: Medicare Other | Admitting: Family Medicine

## 2020-12-27 VITALS — BP 124/76 | HR 74 | Temp 97.3°F | Ht 66.0 in | Wt 157.6 lb

## 2020-12-27 DIAGNOSIS — E559 Vitamin D deficiency, unspecified: Secondary | ICD-10-CM | POA: Diagnosis not present

## 2020-12-27 DIAGNOSIS — M25541 Pain in joints of right hand: Secondary | ICD-10-CM

## 2020-12-27 DIAGNOSIS — M25511 Pain in right shoulder: Secondary | ICD-10-CM

## 2020-12-27 DIAGNOSIS — Z0001 Encounter for general adult medical examination with abnormal findings: Secondary | ICD-10-CM | POA: Diagnosis not present

## 2020-12-27 DIAGNOSIS — E538 Deficiency of other specified B group vitamins: Secondary | ICD-10-CM

## 2020-12-27 DIAGNOSIS — G8929 Other chronic pain: Secondary | ICD-10-CM

## 2020-12-27 DIAGNOSIS — K760 Fatty (change of) liver, not elsewhere classified: Secondary | ICD-10-CM

## 2020-12-27 DIAGNOSIS — Z23 Encounter for immunization: Secondary | ICD-10-CM | POA: Diagnosis not present

## 2020-12-27 DIAGNOSIS — M25542 Pain in joints of left hand: Secondary | ICD-10-CM

## 2020-12-27 DIAGNOSIS — Z0000 Encounter for general adult medical examination without abnormal findings: Secondary | ICD-10-CM

## 2020-12-27 LAB — URINALYSIS, ROUTINE W REFLEX MICROSCOPIC
Bilirubin Urine: NEGATIVE
Hgb urine dipstick: NEGATIVE
Ketones, ur: NEGATIVE
Nitrite: NEGATIVE
Specific Gravity, Urine: 1.015 (ref 1.000–1.030)
Total Protein, Urine: NEGATIVE
Urine Glucose: NEGATIVE
Urobilinogen, UA: 0.2 (ref 0.0–1.0)
pH: 6.5 (ref 5.0–8.0)

## 2020-12-27 LAB — CBC
HCT: 41.6 % (ref 36.0–46.0)
Hemoglobin: 13.9 g/dL (ref 12.0–15.0)
MCHC: 33.4 g/dL (ref 30.0–36.0)
MCV: 94.4 fl (ref 78.0–100.0)
Platelets: 201 10*3/uL (ref 150.0–400.0)
RBC: 4.41 Mil/uL (ref 3.87–5.11)
RDW: 12.8 % (ref 11.5–15.5)
WBC: 6.3 10*3/uL (ref 4.0–10.5)

## 2020-12-27 LAB — VITAMIN B12: Vitamin B-12: 446 pg/mL (ref 211–911)

## 2020-12-27 LAB — LIPID PANEL
Cholesterol: 220 mg/dL — ABNORMAL HIGH (ref 0–200)
HDL: 88.8 mg/dL (ref >39.00)
LDL Cholesterol: 115 mg/dL — ABNORMAL HIGH (ref 0–99)
NonHDL: 131.06
Total CHOL/HDL Ratio: 2
Triglycerides: 82 mg/dL (ref 0.0–149.0)
VLDL: 16.4 mg/dL (ref 0.0–40.0)

## 2020-12-27 LAB — COMPREHENSIVE METABOLIC PANEL
ALT: 15 U/L (ref 0–35)
AST: 15 U/L (ref 0–37)
Albumin: 4.4 g/dL (ref 3.5–5.2)
Alkaline Phosphatase: 58 U/L (ref 39–117)
BUN: 17 mg/dL (ref 6–23)
CO2: 28 mEq/L (ref 19–32)
Calcium: 9.7 mg/dL (ref 8.4–10.5)
Chloride: 101 mEq/L (ref 96–112)
Creatinine, Ser: 0.88 mg/dL (ref 0.40–1.20)
GFR: 66.83 mL/min (ref >60.00)
Glucose, Bld: 95 mg/dL (ref 70–99)
Potassium: 4.1 mEq/L (ref 3.5–5.1)
Sodium: 139 mEq/L (ref 135–145)
Total Bilirubin: 0.5 mg/dL (ref 0.2–1.2)
Total Protein: 6.8 g/dL (ref 6.0–8.3)

## 2020-12-27 LAB — VITAMIN D 25 HYDROXY (VIT D DEFICIENCY, FRACTURES): VITD: 64.25 ng/mL (ref 30.00–100.00)

## 2020-12-27 MED ORDER — TRAMADOL HCL 50 MG PO TABS: ORAL_TABLET | ORAL | 0 refills | Status: DC

## 2020-12-27 NOTE — Progress Notes (Signed)
Established Patient Office Visit  Subjective:  Patient ID: Michele Hanson, female    DOB: 1950/12/13  Age: 70 y.o. MRN: [id number]  CC:  Chief Complaint  Patient presents with   Annual Exam    CPE, no concerns. Patient would like your opinion on shoulder replacement. Have noticed some bruising on arms.     HPI [name] presents for physical exam follow-up hypertension, elevated with fatty liver disease, vitamin D deficiency chronic right shoulder pain.  Orthopedist has recommended a reverse shoulder replacement.  She is considering the surgery.  She is active physically playing golf tennis and attending exercise classes.  She is taking her B12 daily.  Blood pressure controlled with Zestoretic.  Continues high-dose vitamin D and atorvastatin for vitamin D deficiency and elevated cholesterol.  Uses tramadol as needed for chronic aches and pains.  She prefers to take aspirin.  She has been using Goody's powders as well.  Past Medical History:  Diagnosis Date   GERD (gastroesophageal reflux disease)    Hyperlipemia    Hypertension     Past Surgical History:  Procedure Laterality Date   CHOLECYSTECTOMY      Family History  Family history unknown: Yes    Social History   Socioeconomic History   Marital status: Married    Spouse name: Not on file   Number of children: Not on file   Years of education: Not on file   Highest education level: Not on file  Occupational History   Not on file  Tobacco Use   Smoking status: Never   Smokeless tobacco: Never  Vaping Use   Vaping Use: Never used  Substance and Sexual Activity   Alcohol use: Yes    Comment: 1-2 alcoholic drinks daily   Drug use: Never   Sexual activity: Yes  Other Topics Concern   Not on file  Social History Narrative   Not on file   Social Determinants of Health   Financial Resource Strain: Low Risk    Difficulty of Paying Living Expenses: Not hard at all  Food Insecurity: No Food Insecurity    Worried About Charity fundraiser in the Last Year: Never true   [place] in the Last Year: Never true  Transportation Needs: No Transportation Needs   Lack of Transportation (Medical): No   Lack of Transportation (Non-Medical): No  Physical Activity: Sufficiently Active   Days of Exercise per Week: 7 days   Minutes of Exercise per Session: 30 min  Stress: No Stress Concern Present   Feeling of Stress : Not at all  Social Connections: Moderately Integrated   Frequency of Communication with Friends and Family: More than three times a week   Frequency of Social Gatherings with Friends and Family: Once a week   Attends Religious Services: Never   Marine scientist or Organizations: Yes   Attends Music therapist: 1 to 4 times per year   Marital Status: Married  Human resources officer Violence: Not At Risk   Fear of Current or Ex-Partner: No   Emotionally Abused: No   Physically Abused: No   Sexually Abused: No    Outpatient Medications Prior to Visit  Medication Sig Dispense Refill   atorvastatin (LIPITOR) 20 MG tablet TAKE ONE TABLET BY MOUTH ONE TIME DAILY 90 tablet 3   azelastine (ASTELIN) 0.1 % nasal spray USE ONE SPRAY INTO BOTH NOSTRILS AS NEEDED 30 mL 5   citalopram (CELEXA) 20 MG tablet TAKE ONE  TABLET BY MOUTH ONE TIME DAILY 90 tablet 1   denosumab (PROLIA) 60 MG/ML SOSY injection Inject 60 mg into the skin every 6 (six) months.     estradiol (ESTRACE) 0.1 MG/GM vaginal cream      fluticasone (FLONASE) 50 MCG/ACT nasal spray USE ONE SPRAY IN EACH NOSTRIL DAILY **SHAKE BEFORE USING** 16 mL 4   gabapentin (NEURONTIN) 300 MG capsule TAKE ONE CAPSULE BY MOUTH TWICE A DAY 180 capsule 1   lisinopril-hydrochlorothiazide (ZESTORETIC) 20-12.5 MG tablet TAKE ONE TABLET BY MOUTH ONE TIME DAILY 30 tablet 0   meloxicam (MOBIC) 15 MG tablet TAKE ONE TABLET BY MOUTH ONE TIME DAILY FOR 10 DAYS THEN TAKE AS NEEDED 30 tablet 1   methocarbamol (ROBAXIN) 500 MG tablet TAKE ONE  TABLET BY MOUTH EVERY 8 HOURS AS NEEDED FOR MUSCLE SPASMS 60 tablet 0   omeprazole (PRILOSEC) 20 MG capsule Take 1 capsule (20 mg total) by mouth daily. 90 capsule 0   Vitamin D, Ergocalciferol, (DRISDOL) 1.25 MG (50000 UNIT) CAPS capsule Take 50,000 Units by mouth every 7 (seven) days.     traMADol (ULTRAM) 50 MG tablet TAKE ONE TABLET BY MOUTH EVERY 8 HOURS AS NEEDED FOR SEVERE PAIN FOR UP TO 5 DAYS 30 tablet 0   No facility-administered medications prior to visit.    Allergies  Allergen Reactions   Ciprofloxacin Other (See Comments)    Neuropathy in feet   Neomycin Itching    Eye drops   Nickel Itching and Rash    ROS Review of Systems  Constitutional:  Positive for fatigue (ongoing since Covid infection). Negative for diaphoresis, fever and unexpected weight change.  HENT: Negative.    Eyes:  Negative for photophobia and visual disturbance.  Respiratory: Negative.    Cardiovascular: Negative.   Gastrointestinal: Negative.   Endocrine: Negative for polyphagia and polyuria.  Genitourinary:  Negative for difficulty urinating, dysuria and frequency.  Musculoskeletal:  Positive for arthralgias and back pain.  Skin:  Negative for pallor and rash.  Neurological:  Negative for speech difficulty and weakness.  Hematological:  Does not bruise/bleed easily.  Psychiatric/Behavioral: Negative.       Depression screen [facility] 2/9 12/27/2020 12/13/2020 01/31/2020  Decreased Interest 0 0 0  Down, Depressed, Hopeless 0 0 0  PHQ - 2 Score 0 0 0  Altered sleeping - - -  Tired, decreased energy - - -  Change in appetite - - -  Feeling bad or failure about yourself  - - -  Trouble concentrating - - -  Moving slowly or fidgety/restless - - -  Suicidal thoughts - - -  PHQ-9 Score - - -  Difficult doing work/chores - - -     Objective:    Physical Exam Vitals and nursing note reviewed.  Constitutional:      General: She is not in acute distress.    Appearance: Normal appearance. She is  not ill-appearing, toxic-appearing or diaphoretic.  HENT:     Head: Normocephalic and atraumatic.     Right Ear: Tympanic membrane, ear canal and external ear normal.     Left Ear: Tympanic membrane, ear canal and external ear normal.     Mouth/Throat:     Mouth: Mucous membranes are moist.     Pharynx: Oropharynx is clear. No oropharyngeal exudate or posterior oropharyngeal erythema.  Eyes:     General: No scleral icterus.       Right eye: No discharge.        Left eye:  No discharge.     Extraocular Movements: Extraocular movements intact.     Conjunctiva/sclera: Conjunctivae normal.     Pupils: Pupils are equal, round, and reactive to light.  Neck:     Vascular: No carotid bruit.  Cardiovascular:     Rate and Rhythm: Normal rate and regular rhythm.  Pulmonary:     Effort: Pulmonary effort is normal.     Breath sounds: Normal breath sounds.  Musculoskeletal:     Right shoulder: Decreased range of motion.     Cervical back: No rigidity or tenderness.     Right lower leg: No edema.     Left lower leg: No edema.  Lymphadenopathy:     Cervical: No cervical adenopathy.  Skin:    General: Skin is warm and dry.  Neurological:     Mental Status: She is alert and oriented to person, place, and time.  Psychiatric:        Mood and Affect: Mood normal.        Behavior: Behavior normal.    BP 124/76 (BP Location: Left Arm, Patient Position: Sitting, Cuff Size: Normal)   Pulse 74   Temp (!) 97.3 F (36.3 C) (Temporal)   Ht '5\' 6"'$  (1.676 m)   Wt 157 lb 9.6 oz (71.5 kg)   SpO2 97%   BMI 25.44 kg/m  Wt Readings from Last 3 Encounters:  12/27/20 157 lb 9.6 oz (71.5 kg)  12/13/20 155 lb (70.3 kg)  01/31/20 152 lb (68.9 kg)     Health Maintenance Due  Topic Date Due   PNA vac Low Risk Adult (1 of 2 - PCV13) Never done   Zoster Vaccines- Shingrix (2 of 2) 05/09/2020    There are no preventive care reminders to display for this patient.  Lab Results  Component Value Date    TSH 1.37 07/15/2017   Lab Results  Component Value Date   WBC 6.0 12/15/2019   HGB 13.1 12/15/2019   HCT 39.4 12/15/2019   MCV 92.2 12/15/2019   PLT 176.0 12/15/2019   Lab Results  Component Value Date   NA 140 12/15/2019   K 4.1 12/15/2019   CHLORIDE 104 08/09/2013   CO2 27 12/15/2019   GLUCOSE 94 12/15/2019   BUN 19 12/15/2019   CREATININE 0.96 12/15/2019   BILITOT 0.5 12/15/2019   ALKPHOS 58 12/15/2019   AST 14 12/15/2019   ALT 12 12/15/2019   PROT 6.6 12/15/2019   ALBUMIN 4.4 12/15/2019   CALCIUM 9.2 12/15/2019   ANIONGAP 11 08/09/2013   GFR 57.65 (L) 12/15/2019   Lab Results  Component Value Date   CHOL 182 12/15/2019   Lab Results  Component Value Date   HDL 84.30 12/15/2019   Lab Results  Component Value Date   LDLCALC 84 12/15/2019   Lab Results  Component Value Date   TRIG 69.0 12/15/2019   Lab Results  Component Value Date   CHOLHDL 2 12/15/2019   No results found for: HGBA1C    Assessment & Plan:   Problem List Items Addressed This Visit       Digestive   NAFLD (nonalcoholic fatty liver disease)   Relevant Orders   Comprehensive metabolic panel   Lipid panel     Other   Healthcare maintenance   Relevant Orders   CBC   Comprehensive metabolic panel   Urinalysis, Routine w reflex microscopic   Flu vaccine HIGH DOSE PF (Fluzone High dose) (Completed)   B12 deficiency - Primary  Relevant Orders   Vitamin B12   Joint pain in both hands   Relevant Medications   traMADol (ULTRAM) 50 MG tablet   Chronic right shoulder pain   Relevant Medications   traMADol (ULTRAM) 50 MG tablet    Meds ordered this encounter  Medications   traMADol (ULTRAM) 50 MG tablet    Sig: TAKE ONE TABLET BY MOUTH EVERY 8 HOURS AS NEEDED FOR SEVERE PAIN FOR UP TO 5 DAYS    Dispense:  30 tablet    Refill:  0    Follow-up: Return in about 6 months (around 06/26/2021), or Please have shingrix vaccine..  Information was given on health maintenance disease  prevention.  12 deficiency, fatty liver disease in the Shingrix vaccine.  Wished her good luck with her right shoulder surgery if she chooses to go through with it.  Advised her to stop using Goody's powders.  Libby Maw, MD

## 2020-12-31 ENCOUNTER — Other Ambulatory Visit: Payer: Self-pay | Admitting: Family Medicine

## 2020-12-31 DIAGNOSIS — I10 Essential (primary) hypertension: Secondary | ICD-10-CM

## 2021-01-12 ENCOUNTER — Other Ambulatory Visit: Payer: Self-pay | Admitting: Family Medicine

## 2021-01-12 DIAGNOSIS — I10 Essential (primary) hypertension: Secondary | ICD-10-CM

## 2021-01-12 DIAGNOSIS — G629 Polyneuropathy, unspecified: Secondary | ICD-10-CM

## 2021-01-25 ENCOUNTER — Other Ambulatory Visit: Payer: Self-pay | Admitting: Family Medicine

## 2021-01-25 DIAGNOSIS — M48062 Spinal stenosis, lumbar region with neurogenic claudication: Secondary | ICD-10-CM

## 2021-02-05 ENCOUNTER — Ambulatory Visit: Payer: Medicare Other

## 2021-02-25 ENCOUNTER — Other Ambulatory Visit: Payer: Self-pay | Admitting: Family Medicine

## 2021-02-25 DIAGNOSIS — G8929 Other chronic pain: Secondary | ICD-10-CM

## 2021-02-25 DIAGNOSIS — M25542 Pain in joints of left hand: Secondary | ICD-10-CM

## 2021-02-25 NOTE — Telephone Encounter (Signed)
Refill request for:  Tramadol 50 mg LR 12/27/20, #30, 0 rf LOV 12/27/20 FOV  06/26/20  Please review and advise  Thanks. Dm/cma

## 2021-03-12 ENCOUNTER — Other Ambulatory Visit: Payer: Self-pay

## 2021-03-12 ENCOUNTER — Encounter (HOSPITAL_BASED_OUTPATIENT_CLINIC_OR_DEPARTMENT_OTHER): Payer: Self-pay | Admitting: Orthopaedic Surgery

## 2021-03-12 IMAGING — DX DG WRIST COMPLETE 3+V*R*
4 series · 4 of 4 positions shown · non-contrast
Comparison: No prior.

CLINICAL DATA: Fall.  Right wrist pain.

EXAM:
RIGHT WRIST - COMPLETE 3+ VIEW

[wrist pa]
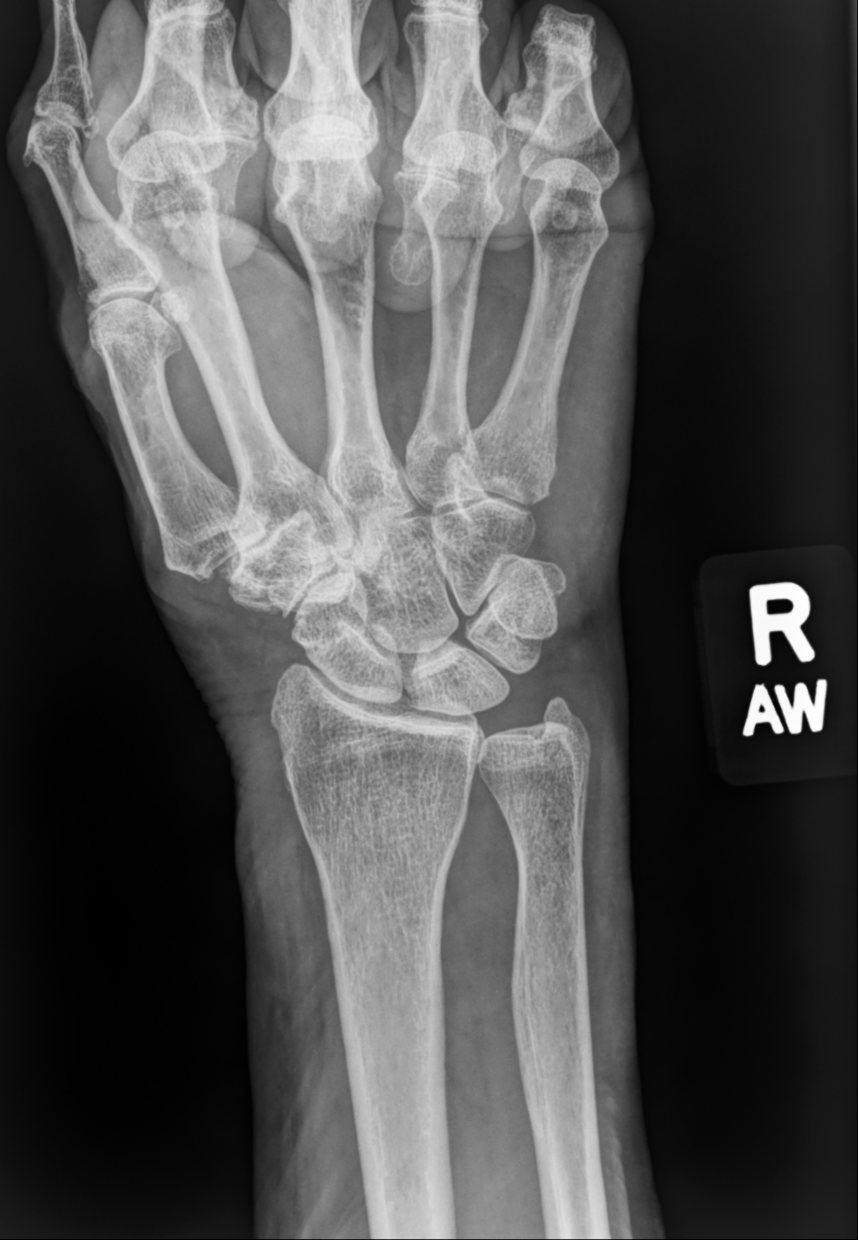

[wrist mlo]
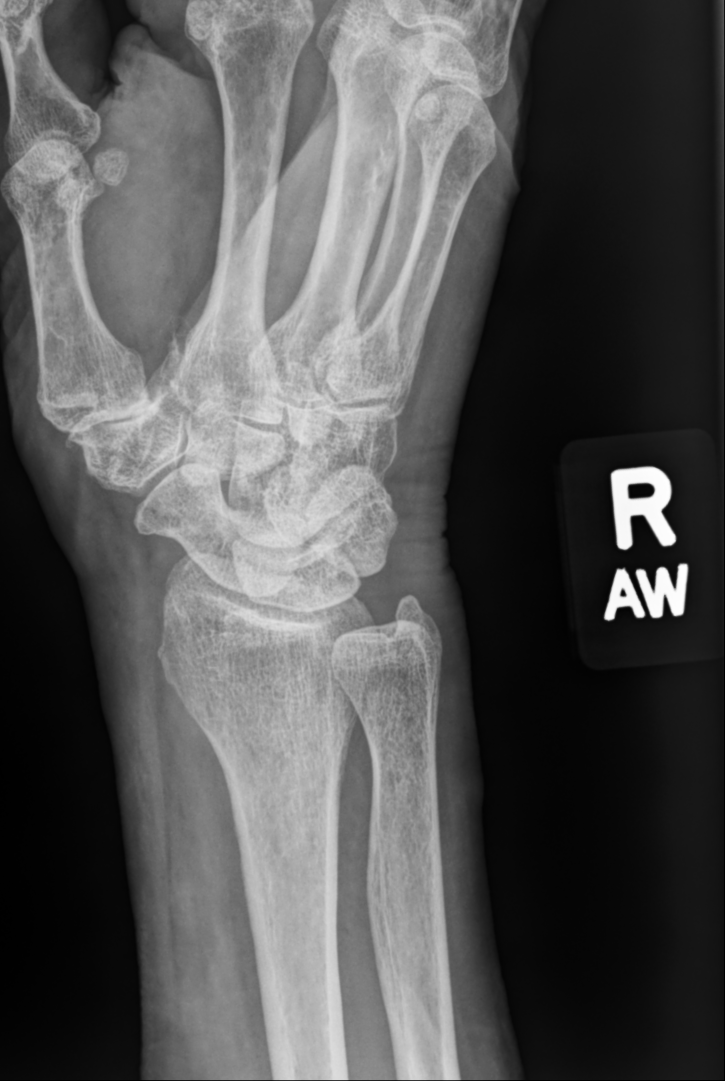

[wrist lat]
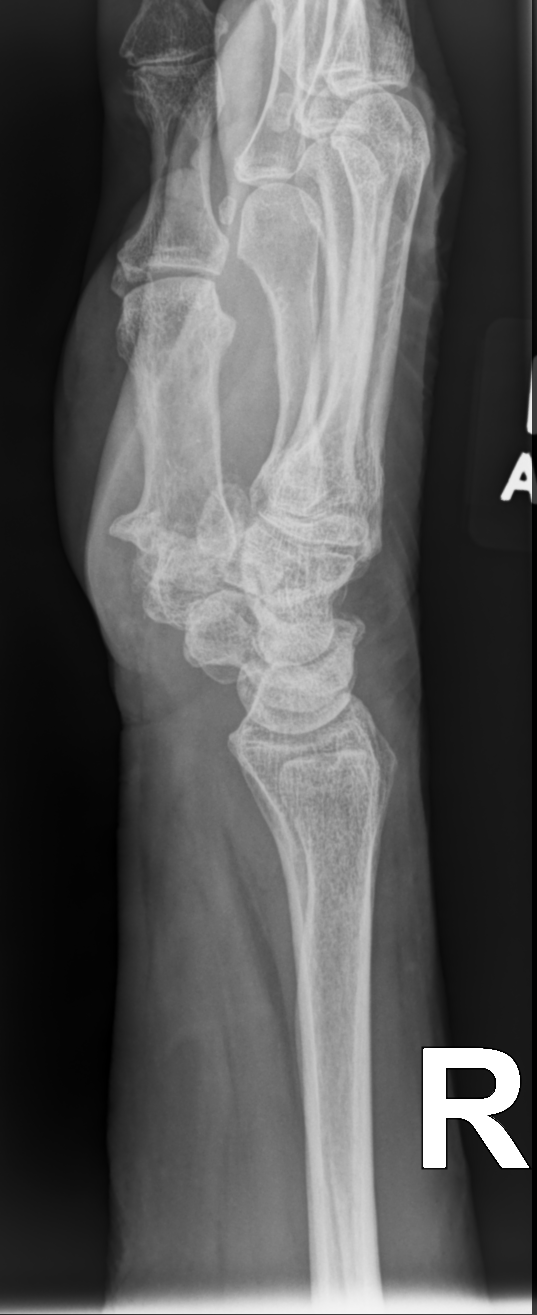

[wrist (navicular) pa]
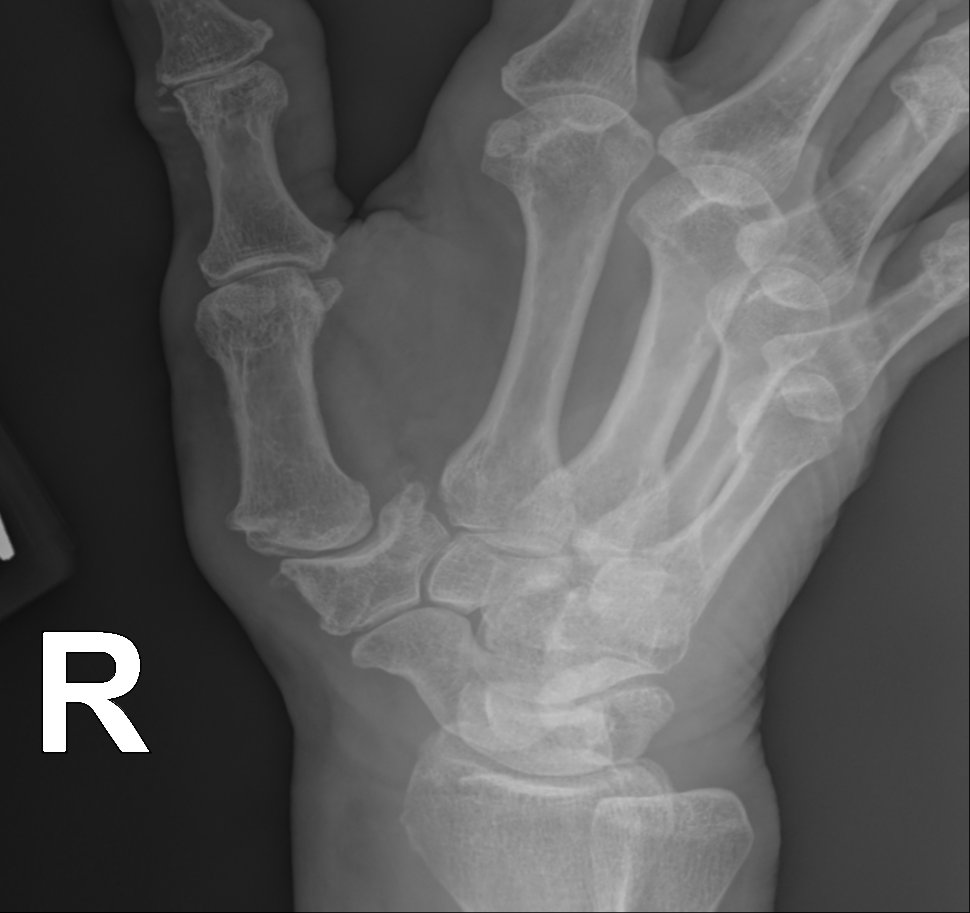

[4 of 4 positions shown; findings below may reference images not displayed]

FINDINGS: Diffuse degenerative change. Degenerative changes most prominent
about the first metacarpal phalangeal joint. No acute bony
abnormality identified. No evidence of fracture dislocation.
IMPRESSION: Diffuse degenerative change.  No acute abnormality.

## 2021-03-14 ENCOUNTER — Encounter (HOSPITAL_BASED_OUTPATIENT_CLINIC_OR_DEPARTMENT_OTHER)
Admission: RE | Admit: 2021-03-14 | Discharge: 2021-03-14 | Disposition: A | Discharge status: Home / Self Care | Payer: Medicare Other | Source: Ambulatory Visit | Attending: Orthopaedic Surgery | Admitting: Orthopaedic Surgery

## 2021-03-14 ENCOUNTER — Other Ambulatory Visit: Payer: Self-pay

## 2021-03-14 ENCOUNTER — Encounter: Payer: Self-pay | Admitting: Family Medicine

## 2021-03-14 ENCOUNTER — Ambulatory Visit (INDEPENDENT_AMBULATORY_CARE_PROVIDER_SITE_OTHER): Payer: Medicare Other | Admitting: Family Medicine

## 2021-03-14 VITALS — BP 136/84 | HR 84 | Temp 95.6°F | Ht 66.0 in | Wt 161.4 lb

## 2021-03-14 DIAGNOSIS — Z01818 Encounter for other preprocedural examination: Secondary | ICD-10-CM | POA: Insufficient documentation

## 2021-03-14 DIAGNOSIS — I10 Essential (primary) hypertension: Secondary | ICD-10-CM | POA: Insufficient documentation

## 2021-03-14 DIAGNOSIS — K120 Recurrent oral aphthae: Secondary | ICD-10-CM | POA: Diagnosis not present

## 2021-03-14 LAB — BASIC METABOLIC PANEL
Anion gap: 11 (ref 5–15)
BUN: 19 mg/dL (ref 8–23)
CO2: 26 mmol/L (ref 22–32)
Calcium: 9.6 mg/dL (ref 8.9–10.3)
Chloride: 102 mmol/L (ref 98–111)
Creatinine, Ser: 0.94 mg/dL (ref 0.44–1.00)
GFR, Estimated: >60 mL/min (ref >60)
Glucose, Bld: 107 mg/dL — ABNORMAL HIGH (ref 70–99)
Potassium: 4.5 mmol/L (ref 3.5–5.1)
Sodium: 139 mmol/L (ref 135–145)

## 2021-03-14 LAB — SURGICAL PCR SCREEN
MRSA, PCR: NEGATIVE
Staphylococcus aureus: NEGATIVE

## 2021-03-14 MED ORDER — CLOBETASOL PROPIONATE 0.05 % EX GEL: 1.0000 "application " | Freq: Two times a day (BID) | CUTANEOUS | 0 refills | Status: DC

## 2021-03-14 NOTE — Progress Notes (Signed)
[facility] [address] [place] [address] Dept: [phone] Dept Fax: [phone]  Office Visit  Subjective:    Patient ID: Michele Hanson, female    DOB: 12-03-1950, 70 y.o..   MRN: [id number]  Chief Complaint  Patient presents with   Acute Visit    C/o a sore in the back of mouth under right side of tongue.     History of Present Illness:  Patient is in today for evaluation of a sore in her mouth for 5 days. She notes this is back under the tongue on the right. She used a mouth wash and feels this has been mildly better. Michele Hanson has surgery scheduled for next week for a right shoulder joint replacement. She has not had specific signs of recent infection. She does recall having mouth ulcers several times in childhood.  Past Medical History: Patient Active Problem List   Diagnosis Date Noted   De Quervain's tenosynovitis, right 07/22/2019   Abrasion of right knee 03/28/2019   Contusion of right chest wall 03/28/2019   Sprain of right wrist 03/28/2019   Bilateral carpal tunnel syndrome 03/17/2019   NAFLD (nonalcoholic fatty liver disease) 03/15/2019   Tendinitis of left rotator cuff 01/28/2019   Capsulitis of right shoulder 01/28/2019   Chronic right shoulder pain 01/21/2019   History of hepatitis C 01/21/2019   Joint pain in both hands 01/18/2019   Need for influenza vaccination 01/18/2019   Depression with anxiety 09/15/2018   Seasonal allergic rhinitis due to pollen 03/16/2018   B12 deficiency 07/16/2017   Neuropathy 07/15/2017   Spinal stenosis of lumbar region with neurogenic claudication 07/15/2017   Elevated cholesterol 07/15/2017   Healthcare maintenance 07/15/2017   Essential hypertension 07/15/2017   Abnormal CBC 07/15/2017   Monoclonal paraproteinemia 03/23/2012   Past Surgical History:  Procedure Laterality Date   BACK SURGERY     CARPAL TUNNEL RELEASE Bilateral    CHOLECYSTECTOMY      DIAGNOSTIC LAPAROSCOPY     INCONTINENCE SURGERY     LAPAROSCOPIC LYSIS OF ADHESIONS     Family History  Family history unknown: Yes   Outpatient Medications Prior to Visit  Medication Sig Dispense Refill   Aspirin-Acetaminophen-Caffeine (GOODYS EXTRA STRENGTH PO) Take by mouth.     atorvastatin (LIPITOR) 20 MG tablet TAKE ONE TABLET BY MOUTH ONE TIME DAILY 90 tablet 3   azelastine (ASTELIN) 0.1 % nasal spray USE ONE SPRAY INTO BOTH NOSTRILS AS NEEDED 30 mL 5   citalopram (CELEXA) 20 MG tablet TAKE ONE TABLET BY MOUTH ONE TIME DAILY 90 tablet 1   denosumab (PROLIA) 60 MG/ML SOSY injection Inject 60 mg into the skin every 6 (six) months.     estradiol (ESTRACE) 0.1 MG/GM vaginal cream      fluticasone (FLONASE) 50 MCG/ACT nasal spray USE ONE SPRAY IN EACH NOSTRIL DAILY **SHAKE BEFORE USING** 16 mL 4   gabapentin (NEURONTIN) 300 MG capsule TAKE ONE CAPSULE BY MOUTH TWICE A DAY 180 capsule 1   lisinopril-hydrochlorothiazide (ZESTORETIC) 20-12.5 MG tablet TAKE ONE TABLET BY MOUTH ONE TIME DAILY 90 tablet 2   meloxicam (MOBIC) 15 MG tablet TAKE ONE TABLET BY MOUTH ONE TIME DAILY FOR 10 DAYS THEN TAKE AS NEEDED 30 tablet 1   methocarbamol (ROBAXIN) 500 MG tablet TAKE ONE TABLET BY MOUTH EVERY 8 HOURS AS NEEDED FOR MUSCLE SPASM 60 tablet 0   omeprazole (PRILOSEC) 20 MG capsule Take 1 capsule (20 mg total) by mouth daily. [place]  capsule 0   traMADol (ULTRAM) 50 MG tablet TAKE ONE TABLET BY MOUTH EVERY 8 HOURS AS NEEDED FOR SEVERE PAIN FOR 5 DAYS 30 tablet 0   Vitamin D, Ergocalciferol, (DRISDOL) 1.25 MG (50000 UNIT) CAPS capsule Take 50,000 Units by mouth every 7 (seven) days.     No facility-administered medications prior to visit.   Allergies  Allergen Reactions   Ciprofloxacin Other (See Comments)    Neuropathy in feet   Neomycin Itching    Eye drops   Nickel Itching and Rash   Objective:   Today's Vitals   03/14/21 0839  BP: 136/84  Pulse: 84  Temp: (!) 95.6 F (35.3 C)  SpO2: 96%   Weight: 161 lb 6.4 oz (73.2 kg)  Height: 5\' 6"  (1.676 m)   Body mass index is 26.05 kg/m.   General: Well developed, well nourished. No acute distress. HEENT: Normocephalic, non-traumatic. Mucous membranes moist. There is a 2-3 mm   whitish-gray lesion on the posterior lateral margin of the tongue on the right. No palpable   mass. Oropharynx clear. Good dentition. Psych: Alert and oriented. Normal mood and affect.  Health Maintenance Due  Topic Date Due   Pneumonia Vaccine 73+ Years old (1 - PCV) Never done     Assessment & Plan:   1. Aphthous ulcer Discussed the nature of canker sores. I recommend she dry this area with gauze and apply a small amount of Temovate gel. She should do this three times a day and avoid drinking or eating for an hour afterwards. Follow-up as needed.  - clobetasol (TEMOVATE) 0.05 % GEL; Apply 1 application topically 2 (two) times daily.  Dispense: 15 g; Refill: 0  Michele Salter, MD

## 2021-03-14 NOTE — Progress Notes (Signed)
      Enhanced Recovery after Surgery for Orthopedics Enhanced Recovery after Surgery is a protocol used to improve the stress on your body and your recovery after surgery.  Patient Instructions  The night before surgery:  No food after midnight. ONLY clear liquids after midnight  The day of surgery (if you do NOT have diabetes):  Drink ONE (1) Pre-Surgery Clear Ensure as directed.   This drink was given to you during your hospital  pre-op appointment visit. The pre-op nurse will instruct you on the time to drink the  Pre-Surgery Ensure depending on your surgery time. Finish the drink at the designated time by the pre-op nurse.  Nothing else to drink after completing the  Pre-Surgery Clear Ensure.  The day of surgery (if you have diabetes): Drink ONE (1) Gatorade 2 (G2) as directed. This drink was given to you during your hospital  pre-op appointment visit.  The pre-op nurse will instruct you on the time to drink the   Gatorade 2 (G2) depending on your surgery time. Color of the Gatorade may vary. Red is not allowed. Nothing else to drink after completing the  Gatorade 2 (G2).         If you have questions, please contact your surgeon's office. Surgical soap given with instructions, pt verbalized understanding. Benzoyl Peroxide gel given with written instructions, pt verbalized understanding.

## 2021-03-14 NOTE — Patient Instructions (Signed)
Canker Sores Canker sores are small, painful sores that develop inside your mouth. You can get one or more canker sores on the inside of your lips or cheeks, on your tongue, or anywhere inside your mouth. Canker sores cannot be passed from person to person (are not contagious). These sores are different from the sores that you may get on the outside of your lips (cold sores or fever blisters). What are the causes? The cause of this condition is not known. The condition may be passed down from a parent (genetic). What increases the risk? This condition is more likely to develop in: Women. People in their teens or 58s. Women who are having their menstrual period. People who are under a lot of emotional stress. People who do not get enough iron or B vitamins. People who do not take care of their mouth and teeth (have poor oral hygiene). People who have an injury inside the mouth, such as after having dental work or from chewing something hard. What are the signs or symptoms? Canker sores usually start as painful red bumps. Then they turn into small white, yellow, or gray sores that have red borders. The sores may be painful, and the pain may get worse when you eat or drink. Along with the canker sore, symptoms may also include: Fever. Fatigue. Swollen lymph nodes in your neck. How is this diagnosed? This condition may be diagnosed based on your symptoms and an exam of the inside of your mouth. If you get canker sores often or if they are very bad, you may have tests, such as: Blood tests to rule out possible causes. Swabbing a fluid sample from the sore to be tested for infection. Removing a small tissue sample from the sore (biopsy) to test it for cancer. How is this treated? Most canker sores go away without treatment in about 1 week. Home care is usually the only treatment that you will need. Over-the-counter medicines can relieve discomfort. If you have severe canker sores, your health care  provider may prescribe: Numbing ointment to relieve pain. Do not use numbing gels or products containing benzocaine in children who are 18 years of age or younger. Vitamins. Steroid medicines. These may be given as pills, mouth rinses, or gels. Antibiotic mouth rinse. Follow these instructions at home:  Apply, take, or use over-the-counter and prescription medicines only as told by your health care provider. These include vitamins and ointments. If you were prescribed an antibiotic mouth rinse, use it as told by your health care provider. Do not stop using the antibiotic even if your condition improves. Until the sores are healed: Do not drink coffee or citrus juices. Do not eat spicy or salty foods. Use a mild, over-the-counter mouth rinse as recommended by your health care provider. Practice good oral hygiene by: Flossing your teeth every day. Brushing your teeth with a soft toothbrush twice each day. Contact a health care provider if: Your symptoms do not get better after 2 weeks. You also have a fever or swollen glands in your neck. You get canker sores often. You have a canker sore that is getting larger. You cannot eat or drink due to your canker sores. Summary Canker sores are small, painful sores that develop inside your mouth. Canker sores usually start as painful red bumps that turn into small white, yellow, or gray sores that have red borders. The sores may be quite painful, and the pain may get worse when you eat or drink. Most canker sores  clear up without treatment in about 1 week. Over-the-counter medicines can relieve discomfort. This information is not intended to replace advice given to you by your health care provider. Make sure you discuss any questions you have with your health care provider. Document Revised: 10/04/2020 Document Reviewed: 12/23/2019 Elsevier Patient Education  2022 [employer].

## 2021-03-18 ENCOUNTER — Other Ambulatory Visit: Payer: Self-pay

## 2021-03-18 ENCOUNTER — Other Ambulatory Visit: Payer: Self-pay | Admitting: Family Medicine

## 2021-03-18 DIAGNOSIS — J301 Allergic rhinitis due to pollen: Secondary | ICD-10-CM

## 2021-03-18 MED ORDER — AZELASTINE HCL 0.1 % NA SOLN: NASAL | 3 refills | Status: DC

## 2021-03-19 ENCOUNTER — Encounter (HOSPITAL_BASED_OUTPATIENT_CLINIC_OR_DEPARTMENT_OTHER): Payer: Self-pay | Admitting: Orthopaedic Surgery

## 2021-03-19 NOTE — Anesthesia Preprocedure Evaluation (Addendum)
Anesthesia Evaluation  Patient identified by MRN, date of birth, ID band Patient awake    Reviewed: Allergy & Precautions, NPO status , Patient's Chart, lab work & pertinent test results, reviewed documented beta blocker date and time   Airway Mallampati: II  TM Distance: >3 FB Neck ROM: Full    Dental no notable dental hx. (+) Teeth Intact, Caps, Dental Advisory Given   Pulmonary neg pulmonary ROS,    Pulmonary exam normal breath sounds clear to auscultation       Cardiovascular hypertension, Pt. on medications Normal cardiovascular exam Rhythm:Regular Rate:Normal  EKG 03/14/21 NSR, Normal   Neuro/Psych PSYCHIATRIC DISORDERS Anxiety Depression Peripheral neuropathy  Neuromuscular disease    GI/Hepatic GERD  Medicated,(+) Hepatitis -, CHx/o Hepatitis C Non Alcoholic fatty liver disease   Endo/Other  Hypercholesterolemia  Renal/GU negative Renal ROS Bladder dysfunction      Musculoskeletal  (+) Arthritis , Osteoarthritis,  S/P back surgery for lumbar spinal stenosis DJD right shoulder   Abdominal   Peds  Hematology Monoclonal Paraproteinemia   Anesthesia Other Findings   Reproductive/Obstetrics                            Anesthesia Physical Anesthesia Plan  ASA: 3  Anesthesia Plan: General   Post-op Pain Management: Regional block   Induction: Intravenous  PONV Risk Score and Plan: 4 or greater and Treatment may vary due to age or medical condition and Ondansetron  Airway Management Planned: Oral ETT  Additional Equipment: None  Intra-op Plan:   Post-operative Plan: Extubation in OR  Informed Consent: I have reviewed the patients History and Physical, chart, labs and discussed the procedure including the risks, benefits and alternatives for the proposed anesthesia with the patient or authorized representative who has indicated his/her understanding and acceptance.      Dental advisory given  Plan Discussed with: CRNA and Anesthesiologist  Anesthesia Plan Comments:        Anesthesia Quick Evaluation

## 2021-03-19 NOTE — H&P (Signed)
PREOPERATIVE H&P  Chief Complaint: DJD RIGHT SHOULDER  HPI: Michele Hanson is a 70 y.o. female who is scheduled for Procedure(s): Hamer.   Patient has a past medical history significant for GERD, HTN, HLD.   Patient is a 70 year-old female who had a fracture in 2014.  She had dealt with what was described to her as a frozen shoulder afterwards.  She has loss of range of motion in the shoulder.  She has intermittent moderate pain.  She has had no surgery on the shoulder in the past.  She had an injection previously, but felt that it helped only temporarily.  She is limited by her shoulder overall, though she is not debilitated.  She has had therapy in the past.  She has tried Mobic.  She is occasionally having sharp catching type pain and has a hard stop with range of motion.   Her symptoms are rated as moderate to severe, and have been worsening.  This is significantly impairing activities of daily living.    Please see clinic note for further details on this patient's care.    She has elected for surgical management.   Past Medical History:  Diagnosis Date   Anxiety    Arthritis    Depression    GERD (gastroesophageal reflux disease)    Hepatitis 1973   hep c-was treated   Hyperlipemia    Hypertension    Past Surgical History:  Procedure Laterality Date   BACK SURGERY     CARPAL TUNNEL RELEASE Bilateral    CHOLECYSTECTOMY     DIAGNOSTIC LAPAROSCOPY     INCONTINENCE SURGERY     LAPAROSCOPIC LYSIS OF ADHESIONS     Social History   Socioeconomic History   Marital status: Married    Spouse name: Not on file   Number of children: Not on file   Years of education: Not on file   Highest education level: Not on file  Occupational History   Not on file  Tobacco Use   Smoking status: Never   Smokeless tobacco: Never  Vaping Use   Vaping Use: Never used  Substance and Sexual Activity   Alcohol use: Yes    Comment: 1-2 alcoholic drinks daily    Drug use: Never   Sexual activity: Yes    Birth control/protection: Post-menopausal  Other Topics Concern   Not on file  Social History Narrative   Not on file   Social Determinants of Health   Financial Resource Strain: Not on file  Food Insecurity: Not on file  Transportation Needs: Not on file  Physical Activity: Not on file  Stress: Not on file  Social Connections: Not on file   Family History  Family history unknown: Yes   Allergies  Allergen Reactions   Ciprofloxacin Other (See Comments)    Neuropathy in feet   Neomycin Itching    Eye drops   Nickel Itching and Rash   Prior to Admission medications   Medication Sig Start Date End Date Taking? Authorizing Provider  Aspirin-Acetaminophen-Caffeine (GOODYS EXTRA STRENGTH PO) Take by mouth.   Yes [provider]  atorvastatin (LIPITOR) 20 MG tablet TAKE ONE TABLET BY MOUTH ONE TIME DAILY 11/05/20  Yes Libby Maw, MD  citalopram (CELEXA) 20 MG tablet TAKE ONE TABLET BY MOUTH ONE TIME DAILY 10/02/20  Yes Libby Maw, MD  estradiol (ESTRACE) 0.1 MG/GM vaginal cream  12/06/19  Yes [provider]  fluticasone (FLONASE) 50 MCG/ACT nasal spray  USE ONE SPRAY IN EACH NOSTRIL DAILY **SHAKE BEFORE USING** 09/13/20  Yes Libby Maw, MD  gabapentin (NEURONTIN) 300 MG capsule TAKE ONE CAPSULE BY MOUTH TWICE A DAY 01/13/21  Yes Libby Maw, MD  lisinopril-hydrochlorothiazide (ZESTORETIC) 20-12.5 MG tablet TAKE ONE TABLET BY MOUTH ONE TIME DAILY 01/13/21  Yes Libby Maw, MD  meloxicam (MOBIC) 15 MG tablet TAKE ONE TABLET BY MOUTH ONE TIME DAILY FOR 10 DAYS THEN TAKE AS NEEDED 03/01/20  Yes Dutch Quint B, FNP  methocarbamol (ROBAXIN) 500 MG tablet TAKE ONE TABLET BY MOUTH EVERY 8 HOURS AS NEEDED FOR MUSCLE SPASM 01/26/21  Yes Libby Maw, MD  traMADol (ULTRAM) 50 MG tablet TAKE ONE TABLET BY MOUTH EVERY 8 HOURS AS NEEDED FOR SEVERE PAIN FOR 5 DAYS 02/25/21   Yes Libby Maw, MD  Vitamin D, Ergocalciferol, (DRISDOL) 1.25 MG (50000 UNIT) CAPS capsule Take 50,000 Units by mouth every 7 (seven) days.   Yes [provider]  azelastine (ASTELIN) 0.1 % nasal spray USE ONE SPRAY INTO BOTH NOSTRILS AS NEEDED 03/18/21   Libby Maw, MD  clobetasol (TEMOVATE) 0.05 % GEL Apply 1 application topically 2 (two) times daily. 03/14/21   Haydee Salter, MD  denosumab (PROLIA) 60 MG/ML SOSY injection Inject 60 mg into the skin every 6 (six) months.    [provider]  omeprazole (PRILOSEC) 20 MG capsule TAKE ONE CAPSULE BY MOUTH ONE TIME DAILY 03/18/21   Libby Maw, MD    ROS: All other systems have been reviewed and were otherwise negative with the exception of those mentioned in the HPI and as above.  Physical Exam: General: Alert, no acute distress Cardiovascular: No pedal edema Respiratory: No cyanosis, no use of accessory musculature GI: No organomegaly, abdomen is soft and non-tender Skin: No lesions in the area of chief complaint Neurologic: Sensation intact distally Psychiatric: Patient is competent for consent with normal mood and affect Lymphatic: No axillary or cervical lymphadenopathy  MUSCULOSKELETAL:  Examination of the right shoulder demonstrates active forward elevation to 110.  External rotation to 30.  Internal rotation to L4.  Cuff strength is 4/5 throughout.    Imaging: X-rays demonstrate a varus malunited proximal humerus fracture with early glenohumeral joint changes and inferior osteophyte that is likely blocking range of motion.  Assessment: DJD RIGHT SHOULDER  Varus malunion of the right shoulder.    Plan: Plan for Procedure(s): REVERSE SHOULDER ARTHROPLASTY  The risks benefits and alternatives were discussed with the patient including but not limited to the risks of nonoperative treatment, versus surgical intervention including infection, bleeding, nerve injury,  blood clots,  cardiopulmonary complications, morbidity, mortality, among others, and they were willing to proceed.   We additionally specifically discussed risks of axillary nerve injury, infection, periprosthetic fracture, continued pain and longevity of implants prior to beginning procedure.    Patient will be closely monitored in PACU for medical stabilization and pain control. If found stable in PACU, patient may be discharged home with outpatient follow-up. If any concerns regarding patient's stabilization patient will be admitted for observation after surgery. The patient is planning to be discharged home with outpatient PT.   The patient acknowledged the explanation, agreed to proceed with the plan and consent was signed.   Operative Plan: Right reverse total shoulder arthroplasty (using nickel free components) Discharge Medications: Standard DVT Prophylaxis: Aspirin Physical Therapy: Outpatient PT Special Discharge needs: Sling. Graball, PA-C  03/19/2021 5:21 PM

## 2021-03-20 ENCOUNTER — Ambulatory Visit (HOSPITAL_BASED_OUTPATIENT_CLINIC_OR_DEPARTMENT_OTHER): Payer: Medicare Other | Admitting: Anesthesiology

## 2021-03-20 ENCOUNTER — Encounter (HOSPITAL_BASED_OUTPATIENT_CLINIC_OR_DEPARTMENT_OTHER)
Admission: RE | Disposition: A | Discharge status: Home / Self Care | Payer: Self-pay | Source: Home / Self Care | Attending: Orthopaedic Surgery

## 2021-03-20 ENCOUNTER — Ambulatory Visit (HOSPITAL_COMMUNITY): Payer: Medicare Other

## 2021-03-20 ENCOUNTER — Ambulatory Visit (HOSPITAL_BASED_OUTPATIENT_CLINIC_OR_DEPARTMENT_OTHER)
Admission: RE | Admit: 2021-03-20 | Discharge: 2021-03-20 | Disposition: A | Discharge status: Home / Self Care | Payer: Medicare Other | Attending: Orthopaedic Surgery | Admitting: Orthopaedic Surgery

## 2021-03-20 ENCOUNTER — Other Ambulatory Visit: Payer: Self-pay

## 2021-03-20 ENCOUNTER — Encounter (HOSPITAL_BASED_OUTPATIENT_CLINIC_OR_DEPARTMENT_OTHER): Payer: Self-pay | Admitting: Orthopaedic Surgery

## 2021-03-20 DIAGNOSIS — D472 Monoclonal gammopathy: Secondary | ICD-10-CM | POA: Insufficient documentation

## 2021-03-20 DIAGNOSIS — I10 Essential (primary) hypertension: Secondary | ICD-10-CM | POA: Diagnosis not present

## 2021-03-20 DIAGNOSIS — F32A Depression, unspecified: Secondary | ICD-10-CM | POA: Insufficient documentation

## 2021-03-20 DIAGNOSIS — Z09 Encounter for follow-up examination after completed treatment for conditions other than malignant neoplasm: Secondary | ICD-10-CM

## 2021-03-20 DIAGNOSIS — G629 Polyneuropathy, unspecified: Secondary | ICD-10-CM | POA: Diagnosis not present

## 2021-03-20 DIAGNOSIS — E785 Hyperlipidemia, unspecified: Secondary | ICD-10-CM | POA: Insufficient documentation

## 2021-03-20 DIAGNOSIS — Z8619 Personal history of other infectious and parasitic diseases: Secondary | ICD-10-CM | POA: Diagnosis not present

## 2021-03-20 DIAGNOSIS — M19011 Primary osteoarthritis, right shoulder: Secondary | ICD-10-CM | POA: Diagnosis present

## 2021-03-20 DIAGNOSIS — K760 Fatty (change of) liver, not elsewhere classified: Secondary | ICD-10-CM | POA: Insufficient documentation

## 2021-03-20 DIAGNOSIS — N319 Neuromuscular dysfunction of bladder, unspecified: Secondary | ICD-10-CM | POA: Insufficient documentation

## 2021-03-20 DIAGNOSIS — E7800 Pure hypercholesterolemia, unspecified: Secondary | ICD-10-CM | POA: Diagnosis not present

## 2021-03-20 DIAGNOSIS — K219 Gastro-esophageal reflux disease without esophagitis: Secondary | ICD-10-CM | POA: Diagnosis not present

## 2021-03-20 DIAGNOSIS — F419 Anxiety disorder, unspecified: Secondary | ICD-10-CM | POA: Insufficient documentation

## 2021-03-20 HISTORY — DX: Fatty (change of) liver, not elsewhere classified: K76.0

## 2021-03-20 HISTORY — DX: Depression, unspecified: F32.A

## 2021-03-20 HISTORY — DX: Anxiety disorder, unspecified: F41.9

## 2021-03-20 HISTORY — DX: Unspecified osteoarthritis, unspecified site: M19.90

## 2021-03-20 HISTORY — PX: REVERSE SHOULDER ARTHROPLASTY: SHX5054

## 2021-03-20 SURGERY — ARTHROPLASTY, SHOULDER, TOTAL, REVERSE
Anesthesia: General | Site: Shoulder | Laterality: Right

## 2021-03-20 MED ORDER — PHENYLEPHRINE HCL (PRESSORS) 10 MG/ML IV SOLN
INTRAVENOUS | Status: AC
  Filled 2021-03-20: qty 1

## 2021-03-20 MED ORDER — CEFAZOLIN SODIUM-DEXTROSE 2-4 GM/100ML-% IV SOLN
INTRAVENOUS | Status: AC
  Filled 2021-03-20: qty 100

## 2021-03-20 MED ORDER — VANCOMYCIN HCL 1000 MG IV SOLR
INTRAVENOUS | Status: DC | PRN
  Administered 2021-03-20: 1000 mg

## 2021-03-20 MED ORDER — ACETAMINOPHEN 500 MG PO TABS
1000.0000 mg | ORAL_TABLET | Freq: Once | ORAL | Status: AC
  Administered 2021-03-20: 1000 mg via ORAL

## 2021-03-20 MED ORDER — ROCURONIUM BROMIDE 10 MG/ML (PF) SYRINGE
PREFILLED_SYRINGE | INTRAVENOUS | Status: AC
  Filled 2021-03-20: qty 10

## 2021-03-20 MED ORDER — ROCURONIUM BROMIDE 100 MG/10ML IV SOLN
INTRAVENOUS | Status: DC | PRN
Start: 2021-03-20 — End: 2021-03-20
  Administered 2021-03-20: 60 mg via INTRAVENOUS

## 2021-03-20 MED ORDER — FENTANYL CITRATE (PF) 100 MCG/2ML IJ SOLN
100.0000 ug | Freq: Once | INTRAMUSCULAR | Status: AC
  Administered 2021-03-20: 50 ug via INTRAVENOUS

## 2021-03-20 MED ORDER — DEXAMETHASONE SODIUM PHOSPHATE 10 MG/ML IJ SOLN
INTRAMUSCULAR | Status: AC
  Filled 2021-03-20: qty 1

## 2021-03-20 MED ORDER — SUGAMMADEX SODIUM 200 MG/2ML IV SOLN
INTRAVENOUS | Status: DC | PRN
  Administered 2021-03-20: 150 mg via INTRAVENOUS

## 2021-03-20 MED ORDER — BUPIVACAINE HCL (PF) 0.5 % IJ SOLN
INTRAMUSCULAR | Status: DC | PRN
  Administered 2021-03-20: 15 mL via PERINEURAL

## 2021-03-20 MED ORDER — PHENYLEPHRINE HCL-NACL 20-0.9 MG/250ML-% IV SOLN
INTRAVENOUS | Status: DC | PRN
  Administered 2021-03-20: 50 ug/min via INTRAVENOUS

## 2021-03-20 MED ORDER — LACTATED RINGERS IV BOLUS: 500.0000 mL | Freq: Once | INTRAVENOUS | Status: DC

## 2021-03-20 MED ORDER — ONDANSETRON HCL 4 MG/2ML IJ SOLN
INTRAMUSCULAR | Status: DC | PRN
  Administered 2021-03-20: 4 mg via INTRAVENOUS

## 2021-03-20 MED ORDER — FENTANYL CITRATE (PF) 100 MCG/2ML IJ SOLN: 25.0000 ug | INTRAMUSCULAR | Status: DC | PRN

## 2021-03-20 MED ORDER — BUPIVACAINE LIPOSOME 1.3 % IJ SUSP
INTRAMUSCULAR | Status: DC | PRN
  Administered 2021-03-20: 10 mL via PERINEURAL

## 2021-03-20 MED ORDER — LACTATED RINGERS IV BOLUS: 250.0000 mL | Freq: Once | INTRAVENOUS | Status: DC

## 2021-03-20 MED ORDER — TRANEXAMIC ACID-NACL 1000-0.7 MG/100ML-% IV SOLN
INTRAVENOUS | Status: AC
  Filled 2021-03-20: qty 100

## 2021-03-20 MED ORDER — MIDAZOLAM HCL 2 MG/2ML IJ SOLN: 2.0000 mg | Freq: Once | INTRAMUSCULAR | Status: DC

## 2021-03-20 MED ORDER — CEFAZOLIN SODIUM-DEXTROSE 2-4 GM/100ML-% IV SOLN
2.0000 g | INTRAVENOUS | Status: AC
  Administered 2021-03-20: 2 g via INTRAVENOUS

## 2021-03-20 MED ORDER — MELOXICAM 15 MG PO TABS: 15.0000 mg | ORAL_TABLET | Freq: Every day | ORAL | 0 refills | Status: DC

## 2021-03-20 MED ORDER — LIDOCAINE HCL (CARDIAC) PF 100 MG/5ML IV SOSY
PREFILLED_SYRINGE | INTRAVENOUS | Status: DC | PRN
  Administered 2021-03-20: 60 mg via INTRAVENOUS

## 2021-03-20 MED ORDER — OXYCODONE HCL 5 MG PO TABS: 5.0000 mg | ORAL_TABLET | Freq: Once | ORAL | Status: DC | PRN

## 2021-03-20 MED ORDER — ONDANSETRON HCL 4 MG/2ML IJ SOLN
INTRAMUSCULAR | Status: AC
  Filled 2021-03-20: qty 2

## 2021-03-20 MED ORDER — FENTANYL CITRATE (PF) 100 MCG/2ML IJ SOLN
INTRAMUSCULAR | Status: AC
  Filled 2021-03-20: qty 2

## 2021-03-20 MED ORDER — FENTANYL CITRATE (PF) 100 MCG/2ML IJ SOLN
INTRAMUSCULAR | Status: DC | PRN
  Administered 2021-03-20: 50 ug via INTRAVENOUS

## 2021-03-20 MED ORDER — ONDANSETRON HCL 4 MG/2ML IJ SOLN: 4.0000 mg | Freq: Once | INTRAMUSCULAR | Status: DC | PRN

## 2021-03-20 MED ORDER — DROPERIDOL 2.5 MG/ML IJ SOLN: 0.6250 mg | Freq: Once | INTRAMUSCULAR | Status: DC | PRN

## 2021-03-20 MED ORDER — GABAPENTIN 300 MG PO CAPS
ORAL_CAPSULE | ORAL | Status: AC
  Filled 2021-03-20: qty 1

## 2021-03-20 MED ORDER — GABAPENTIN 300 MG PO CAPS: 300.0000 mg | ORAL_CAPSULE | Freq: Once | ORAL | Status: DC

## 2021-03-20 MED ORDER — ACETAMINOPHEN 500 MG PO TABS: 1000.0000 mg | ORAL_TABLET | Freq: Three times a day (TID) | ORAL | 0 refills | Status: AC

## 2021-03-20 MED ORDER — EPHEDRINE SULFATE 50 MG/ML IJ SOLN
INTRAMUSCULAR | Status: DC | PRN
  Administered 2021-03-20: 15 mg via INTRAVENOUS

## 2021-03-20 MED ORDER — ACETAMINOPHEN 500 MG PO TABS
ORAL_TABLET | ORAL | Status: AC
  Filled 2021-03-20: qty 2

## 2021-03-20 MED ORDER — LIDOCAINE 2% (20 MG/ML) 5 ML SYRINGE
INTRAMUSCULAR | Status: AC
  Filled 2021-03-20: qty 5

## 2021-03-20 MED ORDER — TRANEXAMIC ACID-NACL 1000-0.7 MG/100ML-% IV SOLN: 1000.0000 mg | INTRAVENOUS | Status: DC

## 2021-03-20 MED ORDER — PROPOFOL 10 MG/ML IV BOLUS
INTRAVENOUS | Status: AC
  Filled 2021-03-20: qty 20

## 2021-03-20 MED ORDER — SODIUM CHLORIDE 0.9 % IV SOLN
INTRAVENOUS | Status: AC | PRN
  Administered 2021-03-20: 1000 mL

## 2021-03-20 MED ORDER — LACTATED RINGERS IV SOLN: INTRAVENOUS | Status: DC

## 2021-03-20 MED ORDER — PROPOFOL 10 MG/ML IV BOLUS
INTRAVENOUS | Status: DC | PRN
  Administered 2021-03-20: 140 mg via INTRAVENOUS

## 2021-03-20 MED ORDER — ONDANSETRON HCL 4 MG PO TABS: 4.0000 mg | ORAL_TABLET | Freq: Three times a day (TID) | ORAL | 0 refills | Status: AC | PRN

## 2021-03-20 MED ORDER — VANCOMYCIN HCL 1000 MG IV SOLR
INTRAVENOUS | Status: AC
  Filled 2021-03-20: qty 60

## 2021-03-20 MED ORDER — MIDAZOLAM HCL 2 MG/2ML IJ SOLN
INTRAMUSCULAR | Status: AC
  Filled 2021-03-20: qty 2

## 2021-03-20 MED ORDER — OXYCODONE HCL 5 MG/5ML PO SOLN: 5.0000 mg | Freq: Once | ORAL | Status: DC | PRN

## 2021-03-20 MED ORDER — BUPIVACAINE HCL (PF) 0.25 % IJ SOLN
INTRAMUSCULAR | Status: AC
  Filled 2021-03-20: qty 30

## 2021-03-20 MED ORDER — OXYCODONE HCL 5 MG PO TABS: ORAL_TABLET | ORAL | 0 refills | Status: AC

## 2021-03-20 MED ORDER — ASPIRIN 81 MG PO CHEW: 81.0000 mg | CHEWABLE_TABLET | Freq: Two times a day (BID) | ORAL | 0 refills | Status: AC

## 2021-03-20 MED ORDER — DEXAMETHASONE SODIUM PHOSPHATE 4 MG/ML IJ SOLN
INTRAMUSCULAR | Status: DC | PRN
Start: 2021-03-20 — End: 2021-03-20
  Administered 2021-03-20: 5 mg via INTRAVENOUS

## 2021-03-20 SURGICAL SUPPLY — 78 items
AID PSTN UNV HD RSTRNT DISP (MISCELLANEOUS) ×1
APL PRP STRL LF DISP 70% ISPRP (MISCELLANEOUS) ×1
AUGMENT BASEPLATE 25M 35D WEDG (Joint) IMPLANT
BASEPLATE AUGMNT 25M 35D WEDGE (Joint) ×2 IMPLANT
BLADE HEX COATED 2.75 (ELECTRODE) IMPLANT
BLADE SAW SAG 73X25 THK (BLADE) ×1
BLADE SAW SGTL 73X25 THK (BLADE) ×1 IMPLANT
BLADE SURG 10 STRL SS (BLADE) IMPLANT
BLADE SURG 15 STRL LF DISP TIS (BLADE) IMPLANT
BLADE SURG 15 STRL SS (BLADE)
BNDG COHESIVE 4X5 TAN ST LF (GAUZE/BANDAGES/DRESSINGS) IMPLANT
BRUSH SCRUB EZ PLAIN DRY (MISCELLANEOUS) ×2 IMPLANT
BSPLAT GLND 35D HLF WDG 25 (Joint) ×1 IMPLANT
CHLORAPREP W/TINT 26 (MISCELLANEOUS) ×2 IMPLANT
CLSR STERI-STRIP ANTIMIC 1/2X4 (GAUZE/BANDAGES/DRESSINGS) ×1 IMPLANT
COOLER ICEMAN CLASSIC (MISCELLANEOUS) ×2 IMPLANT
COVER BACK TABLE 60X90IN (DRAPES) ×2 IMPLANT
COVER MAYO STAND STRL (DRAPES) ×2 IMPLANT
DECANTER SPIKE VIAL GLASS SM (MISCELLANEOUS) IMPLANT
DRAPE IMP U-DRAPE 54X76 (DRAPES) ×1 IMPLANT
DRAPE INCISE IOBAN 66X45 STRL (DRAPES) ×2 IMPLANT
DRAPE U-SHAPE 76X120 STRL (DRAPES) ×4 IMPLANT
DRSG AQUACEL AG ADV 3.5X 6 (GAUZE/BANDAGES/DRESSINGS) ×2 IMPLANT
ELECT BLADE 4.0 EZ CLEAN MEGAD (MISCELLANEOUS) ×2
ELECT REM PT RETURN 9FT ADLT (ELECTROSURGICAL) ×2
ELECTRODE BLDE 4.0 EZ CLN MEGD (MISCELLANEOUS) ×1 IMPLANT
ELECTRODE REM PT RTRN 9FT ADLT (ELECTROSURGICAL) ×1 IMPLANT
FACESHIELD WRAPAROUND (MASK) ×4 IMPLANT
FACESHIELD WRAPAROUND OR TEAM (MASK) ×2 IMPLANT
GLENOSPHERE STD CANN 36 (Joint) ×1 IMPLANT
GLOVE SRG 8 PF TXTR STRL LF DI (GLOVE) ×1 IMPLANT
GLOVE SURG ENC MOIS LTX SZ6.5 (GLOVE) ×3 IMPLANT
GLOVE SURG LTX SZ8 (GLOVE) ×3 IMPLANT
GLOVE SURG POLYISO LF SZ6.5 (GLOVE) ×1 IMPLANT
GLOVE SURG UNDER POLY LF SZ6.5 (GLOVE) ×3 IMPLANT
GLOVE SURG UNDER POLY LF SZ8 (GLOVE) ×2
GOWN STRL REUS W/ TWL LRG LVL3 (GOWN DISPOSABLE) ×2 IMPLANT
GOWN STRL REUS W/TWL LRG LVL3 (GOWN DISPOSABLE) ×4
GOWN STRL REUS W/TWL XL LVL3 (GOWN DISPOSABLE) ×2 IMPLANT
GUIDEWIRE GLENOID 2.5X220 (WIRE) ×1 IMPLANT
HANDPIECE INTERPULSE COAX TIP (DISPOSABLE)
INSERT HUMERAL 36X6MM 12.5DEG (Insert) ×1 IMPLANT
KIT SHOULDER STAB MARCO (KITS) ×1 IMPLANT
MANIFOLD NEPTUNE II (INSTRUMENTS) ×2 IMPLANT
NDL SUT 6 .5 CRC .975X.05 MAYO (NEEDLE) IMPLANT
NEEDLE MAYO TAPER (NEEDLE) ×2
PACK BASIN DAY SURGERY FS (CUSTOM PROCEDURE TRAY) ×2 IMPLANT
PACK SHOULDER (CUSTOM PROCEDURE TRAY) ×2 IMPLANT
PAD COLD SHLDR WRAP-ON (PAD) ×2 IMPLANT
PAD ORTHO SHOULDER 7X19 LRG (SOFTGOODS) ×2 IMPLANT
PENCIL SMOKE EVACUATOR (MISCELLANEOUS) IMPLANT
RESTRAINT HEAD UNIVERSAL NS (MISCELLANEOUS) ×2 IMPLANT
SCREW 5.0X18 (Screw) ×1 IMPLANT
SCREW 5.5X22 (Screw) ×2 IMPLANT
SCREW 5.5X26 (Screw) ×1 IMPLANT
SCREW BONE 6.5 OD 30 NON BIO (Screw) ×1 IMPLANT
SET HNDPC FAN SPRY TIP SCT (DISPOSABLE) ×1 IMPLANT
SET SHOULDER TRAC (MISCELLANEOUS) IMPLANT
SET SHOULDER TRACTION (MISCELLANEOUS) ×2
SHEET MEDIUM DRAPE 40X70 STRL (DRAPES) ×2 IMPLANT
SLEEVE SCD COMPRESS KNEE MED (STOCKING) ×2 IMPLANT
SLEEVE SURGEON STRL (DRAPES) ×1 IMPLANT
SPONGE T-LAP 18X18 ~~LOC~~+RFID (SPONGE) ×1 IMPLANT
STEM HUMERAL SZ1B LONG 88 PTC (Shoulder) ×1 IMPLANT
STRIP CLOSURE SKIN 1/2X4 (GAUZE/BANDAGES/DRESSINGS) ×1 IMPLANT
SUT ETHIBOND 2 V 37 (SUTURE) ×2 IMPLANT
SUT ETHIBOND NAB CT1 #1 30IN (SUTURE) ×2 IMPLANT
SUT FIBERWIRE #5 38 CONV NDL (SUTURE) ×8
SUT MNCRL AB 4-0 PS2 18 (SUTURE) ×1 IMPLANT
SUT VIC AB 0 CT1 27 (SUTURE)
SUT VIC AB 0 CT1 27XCR 8 STRN (SUTURE) IMPLANT
SUT VIC AB 3-0 SH 27 (SUTURE) ×2
SUT VIC AB 3-0 SH 27X BRD (SUTURE) ×1 IMPLANT
SUTURE FIBERWR #5 38 CONV NDL (SUTURE) ×4 IMPLANT
TAPE FIBER 2MM 7IN #2 BLUE (SUTURE) ×1 IMPLANT
TOWEL GREEN STERILE FF (TOWEL DISPOSABLE) ×6 IMPLANT
TRAY SHOULDER REV OFFSET 1.5 (Joint) ×1 IMPLANT
TUBE SUCTION HIGH CAP CLEAR NV (SUCTIONS) ×2 IMPLANT

## 2021-03-20 NOTE — Anesthesia Postprocedure Evaluation (Signed)
Anesthesia Post Note  Patient: Michele Hanson  Procedure(s) Performed: REVERSE SHOULDER ARTHROPLASTY (Right: Shoulder)     Patient location during evaluation: PACU Anesthesia Type: General Level of consciousness: awake and alert and oriented Pain management: pain level controlled Vital Signs Assessment: post-procedure vital signs reviewed and stable Respiratory status: spontaneous breathing, nonlabored ventilation and respiratory function stable Cardiovascular status: blood pressure returned to baseline and stable Postop Assessment: no apparent nausea or vomiting Anesthetic complications: no   No notable events documented.  Last Vitals:  Vitals:   03/20/21 1015 03/20/21 1030  BP: 131/72 130/82  Pulse: 87 93  Resp: 19 13  Temp: 37.2 C   SpO2: 98% 97%    Last Pain:  Vitals:   03/20/21 1015  TempSrc:   PainSc: 0-No pain                 Alexzia Kasler A.

## 2021-03-20 NOTE — Op Note (Signed)
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  She had sniffing it posterior erosion of the glenoid and we used a half wedge augmented implant to restore her anatomy there.  Her proximal humeral malunion complicated her humeral implantation.  She had posterior malreduction of the humeral head as well as varus malreduction.  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  We fixed this with 4 cerclage sutures and had robust fixation of her stem.  We will slow down therapy to start at 4 weeks however the remainder of her protocol be the same.  Tug test was normal.  We additionally spent a great deal of time doing an osteotomy of the posterior aspect of the humeral head to clear bone so that there was not impingement.  Post-operative plan: The patient will be NWB in sling.  The patient will be will be discharged from PACU if continues to be stable as was plan prior to surgery.  DVT prophylaxis Aspirin 81 mg twice daily for 6 weeks.  Pain control with PRN pain medication preferring oral medicines.  Follow up plan will be scheduled in approximately 7 days for incision check and XR.  Physical therapy to start after 4 weeks  Implants: Tornier size 1 long flex stem, 0 low offset tray set to 3 with this +6 polyethylene, 36 titanium glenosphere with 30 center screw and 4 peripheral screws, half wedge 25 augment set to 10:00  Post-Op Diagnosis: Same Surgeons:Primary: Hiram Gash, MD Assistants:Caroline McBane PA-C Location: [facility] OR ROOM 6 Anesthesia: General with Exparel Interscalene Antibiotics: Ancef 2g preop,  Vancomycin 108m locally Tourniquet time: None Estimated Blood Loss: 1245Complications: Calcar fracture due to previous humeral malunion Specimens: None Implants: Implant Name Type Inv. Item Serial No. Manufacturer Lot No. LRB No. Used Action  BASEPLATE AUGMNT [id number] - [id number] BASEPLATE AUGMNT [id number] [id number]  Right 1 Implanted  SCREW BONE 6.5 OD 30 NON BIO - [id number] SCREW BONE 6.5 OD 30 NON BIO  [employer] STERILIZED ON SET Right 1 Implanted  SCREW 5.5X26 - [id number] SCREW 5.5X26  [employer] STERILIZED ON SET Right 1 Implanted  SCREW 5.5X22 - [id number] SCREW 5.5X22  [employer] STERILIZED ON SET Right 2 Implanted  Cannulated Ti Standard Glenosphere   [id number]  Right 1 Implanted  TRAY SHOULDER REV OFFSET 1.5 - [id number] TRAY SHOULDER REV OFFSET 1.5 [id number]  Right 1 Implanted  INSERT HUMERAL 36X6MM 12.5DEG - [id number] INSERT HUMERAL 36X6MM 12.5DEG [id number]  Right 1 Implanted  STEM HUMERAL SZ1B LONG 88 PTC - [id number] STEM HUMERAL SZ1B LONG 88 PTC [id number]  Right 1 Implanted  SCREW 5.0X18 - [id number] SCREW 5.0X18  [employer] STERILIZED ON SET Right 1 Implanted    Indications for Surgery:   CLien Michele Hanson a 70y.o. female with previous humeral malunion with end-stage glenohumeral arthritis in addition.  Benefits and risks of operative and nonoperative management were discussed prior to surgery with patient/guardian(s) and informed consent form was completed.  Infection and need for further surgery were discussed as was prosthetic stability and  cuff issues.  We additionally specifically discussed risks of axillary nerve injury, infection, periprosthetic fracture, continued pain and longevity of implants prior to beginning procedure.      Procedure:   The patient was identified in the preoperative holding area where the surgical site was marked.  Block placed by anesthesia with exparel.  The patient was taken to the OR where a procedural timeout was called and the above noted anesthesia was induced.  The patient was positioned beachchair on allen table with spider arm positioner.  Preoperative antibiotics were dosed.  The patient's right shoulder was prepped and draped in the usual sterile fashion.  A second preoperative timeout was called.       Standard deltopectoral approach was performed with a #10 blade. We dissected down to the subcutaneous tissues and the cephalic vein was taken laterally with the deltoid. Clavipectoral fascia was incised in line with the incision. Deep retractors were placed. The long of the biceps tendon was identified and there was significant tenosynovitis present.  Tenodesis was performed to the pectoralis tendon with #2 Ethibond. The remaining biceps was followed up into the rotator interval where it was released.   The subscapularis was taken down in a full thickness layer with capsule along the humeral neck extending inferiorly around the humeral head. We continued releasing the capsule directly off of the osteophytes inferiorly all the way around the corner. This allowed [place] to dislocate the humeral head.   There was significant malunion of the humeral head as well as posterior malunion of the tuberosities.  This complicated our approach.  Reverse total arthroplasty was indicated based on the patient's mall united humerus.  A humeral cutting guide was inserted down the intramedullary canal. The version was set at 20 of retroversion. Humeral osteotomy was performed with an oscillating saw. The head fragment was passed off the back table. A starter awl was used to open the humeral canal. We next used T-handle straight sound reamers to ream up to an appropriate fit. A chisel was used to remove proximal humeral bone. We then broached starting with a size one broach however the patient's anatomy was quite small and we  immediately noticed that there was a small calcar fracture even with the smallest implant.  We felt that without fixation of the calcar fracture we would risk possible loosening of the stem.  We immediately removed our stem and placed a fiber tape cerclage suture and had robust fixation.  We then backed this up with a #5 FiberWire to backup our fixation.  We replaced our stem and put a cut guide in place.   The subscapularis was again identified and immediately we took care to palpate the axillary nerve anteriorly and verify its position with gentle palpation as well as the tug test.  We then released the SGHL with bovie cautery prior to placing a curved mayo at the junction of the anterior glenoid well above the axillary nerve and bluntly dissecting the subscapularis from the capsule.  We then carefully protected the axillary nerve as we gently released the inferior capsule to fully mobilize the subscapularis.  An anterior deltoid retractor was then placed as well as a small Hohmann retractor superiorly.   The glenoid was inspected and had evidence of severe osteoarthritic wear with full-thickness cartilage loss and exposed subchondral bone.  There is significant posterior erosion.  At this point we felt based on blueprint templating that a full wedge augment was necessary.  We began by using a full  wedge guide to place our center pin as was templated.  We had good position of this pin and we proceeded with our starter center drill.  We initially reamed the native glenoid with a flat reamer before using the angled reamer to ream the neo glenoid.  We checked our fit with our trial.  At this point we proceeded with our center drill and had an intact vault.  We then drilled our center screw to a length of 30 mm.    We selected a 6.5 mm x 30 mm screw and the full wedge baseplate which was placed in the same orientation as our reaming.  We double checked that we had good apposition of the base plate to bone and  then proceeded to place 3 locking screws and one nonlocking screw as is typical.  Due to the patient's nickel allergy we placed a all titanium glenosphere without issue setting or locking screw.  We turned attention back to the humeral side. The cut protector was removed. We trialed with multiple size tray and polyethylene options and selected a 6 which provided good stability and range of motion without excess soft tissue tension. The offset was dialed in to match the normal anatomy. The shoulder was trialed.  There was good ROM in all planes and the shoulder was stable with no inferior translation.  The real humeral implants were opened after again confirming sizes.  As the patient had a calcar fracture we did not feel that passing our sutures through bone tunnels would be appropriate.  We instead cerclage the proximal humerus with #5 FiberWire which we used to eventually repair the subscapularis and also reinforce our bony repair.  We placed our real implants and set our tray to 3:00.  We then placed our poly and reduce the joint which was appropriately stable.  We repaired the subscapularis with a #5 FiberWire obtaining robust purchase.  We irrigated copiously before placing local vancomycin powder and closing in typical fashion.  The wounds were cleaned and dried and an Aquacel dressing was placed. The drapes taken down. The arm was placed into sling with abduction pillow. Patient was awakened, extubated, and transferred to the recovery room in stable condition. There were no intraoperative complications. The sponge, needle, and attention counts were  correct at the end of the case.       Noemi Chapel, PA-C, present and scrubbed throughout the case, critical for completion in a timely fashion, and for retraction, instrumentation, closure.

## 2021-03-20 NOTE — Interval H&P Note (Signed)
All questions answered, patient wants to proceed with procedure. ? ?

## 2021-03-20 NOTE — Progress Notes (Signed)
Assisted Dr. Royce Macadamia with right, ultrasound guided, interscalene  block. Side rails up, monitors on throughout procedure. See vital signs in flow sheet. Tolerated Procedure well.

## 2021-03-20 NOTE — Anesthesia Procedure Notes (Signed)
Procedure Name: Intubation Date/Time: 03/20/2021 8:34 AM Performed by: Glory Buff, CRNA Pre-anesthesia Checklist: Patient identified, Emergency Drugs available, Suction available and Patient being monitored Patient Re-evaluated:Patient Re-evaluated prior to induction Oxygen Delivery Method: Circle system utilized Preoxygenation: Pre-oxygenation with 100% oxygen Induction Type: IV induction Ventilation: Mask ventilation without difficulty Laryngoscope Size: Miller and 3 Grade View: Grade I Tube type: Oral Tube size: 7.0 mm Number of attempts: 1 Airway Equipment and Method: Stylet and Oral airway Placement Confirmation: ETT inserted through vocal cords under direct vision, positive ETCO2 and breath sounds checked- equal and bilateral Secured at: 20 cm Tube secured with: Tape Dental Injury: Teeth and Oropharynx as per pre-operative assessment

## 2021-03-20 NOTE — Anesthesia Procedure Notes (Signed)
Anesthesia Regional Block: Interscalene brachial plexus block   Pre-Anesthetic Checklist: , timeout performed,  Correct Patient, Correct Site, Correct Laterality,  Correct Procedure, Correct Position, site marked,  Risks and benefits discussed,  Surgical consent,  Pre-op evaluation,  At surgeon's request and post-op pain management  Laterality: Right  Prep: chloraprep       Needles:  Injection technique: Single-shot  Needle Type: Echogenic Stimulator Needle     Needle Length: 10cm  Needle Gauge: 21   Needle insertion depth: 6 cm   Additional Needles:   Procedures:,,,, ultrasound used (permanent image in chart),,   Motor weakness within 5 minutes.  Narrative:  Start time: 03/20/2021 8:00 AM End time: 03/20/2021 8:05 AM Injection made incrementally with aspirations every 5 mL.  Performed by: Personally  Anesthesiologist: Josephine Igo, MD  Additional Notes: Timeout performed. Patient sedated. Relevant anatomy ID'd using [place]. Incremental 2-19ml injection of LA with frequent aspiration. Patient tolerated procedure well. '   Right interscalene Block

## 2021-03-20 NOTE — Discharge Instructions (Addendum)
Ophelia Charter MD, MPH Noemi Chapel, PA-C [place] [address] [phone] (tel)   [phone] (fax)   [facility] may leave the operative dressing in place until your follow-up appointment. KEEP THE INCISIONS CLEAN AND DRY. There may be a small amount of fluid/bleeding leaking at the surgical site. This is normal after surgery.  If it fills with liquid or blood please call us immediately to change it for you. Use the provided ice machine or Ice packs as often as possible for the first 3-4 days, then as needed for pain relief.   Keep a layer of cloth or a shirt between your skin and the cooling unit to prevent frost bite as it can get very cold.  SHOWERING: - You may shower on Post-Op Day #2.  - The dressing is water resistant but do not scrub it as it may start to peel up.   - You may remove the sling for showering, but keep a water resistant pillow under the arm to keep both the  elbow and shoulder away from the body (mimicking the abduction sling).  - Gently pat the area dry.  - Do not soak the shoulder in water. Do not go swimming in the pool or ocean until your incision has completely healed (about 4-6 weeks after surgery) - KEEP THE INCISIONS CLEAN AND DRY.  EXERCISES Wear the sling at all times You may remove the sling for showering, but keep the arm across the chest or in a secondary sling.    Accidental/Purposeful External Rotation and shoulder flexion (reaching behind you) is to be avoided at all costs for the first month. Do not lift anything heavier than 1 pound until we discuss it further in clinic.  PHYSICAL THERAPY  - No physical therapy until 4 weeks after surgery   REGIONAL ANESTHESIA (NERVE BLOCKS) The anesthesia team may have performed a nerve block for you if safe in the setting of your care.  This is a great tool used to minimize pain.  Typically the block  may start wearing off overnight but the long acting medicine may last for 3-4 days.  The nerve block wearing off can be a challenging period but please utilize your as needed pain medications to try and manage this period.    POST-OP MEDICATIONS- Multimodal approach to pain control In general your pain will be controlled with a combination of substances.  Prescriptions unless otherwise discussed are electronically sent to your pharmacy.  This is a carefully made plan we use to minimize narcotic use.     Meloxicam - Anti-inflammatory medication taken on a scheduled basis Acetaminophen - Non-narcotic pain medicine taken on a scheduled basis  Oxycodone - This is a strong narcotic, to be used only on an "as needed" basis for SEVERE pain. Aspirin 81mg  - This medicine is used to minimize the risk of blood clots after surgery. Zofran -  take as needed for nausea   FOLLOW-UP If you develop a Fever (>101.5), Redness or Drainage from the surgical incision site, please call our office to arrange for an evaluation. Please call the office to schedule a follow-up appointment for a wound check, 7-10 days post-operatively.  Next dose of tylenol can be given at 1:17pm.  IF YOU HAVE ANY QUESTIONS, PLEASE FEEL FREE TO CALL OUR OFFICE.  HELPFUL INFORMATION  If you had a block, it will wear off between 8-24 hrs postop typically.  This is period when your pain may go from nearly zero to the pain you would have had post-op without the block.  This is an abrupt transition but nothing dangerous is happening.  You may take an extra dose of narcotic when this happens.  Your arm will be in a sling following surgery. You will be in this sling for the next 4 weeks.  We will let you know the exact duration at your follow-up visit.  You may be more comfortable sleeping in a semi-seated position the first few nights following surgery.  Keep a pillow propped under the elbow and forearm for comfort.  If you have a recliner  type of chair it might be beneficial.  If not that is fine too, but it would be helpful to sleep propped up with pillows behind your operated shoulder as well under your elbow and forearm.  This will reduce pulling on the suture lines.  When dressing, put your operative arm in the sleeve first.  When getting undressed, take your operative arm out last.  Loose fitting, button-down shirts are recommended.  In most states it is against the law to drive while your arm is in a sling. And certainly against the law to drive while taking narcotics.  You may return to work/school in the next couple of days when you feel up to it. Desk work and typing in the sling is fine.  We suggest you use the pain medication the first night prior to going to bed, in order to ease any pain when the anesthesia wears off. You should avoid taking pain medications on an empty stomach as it will make you nauseous.  Do not drink alcoholic beverages or take illicit drugs when taking pain medications.  Pain medication may make you constipated.  Below are a few solutions to try in this order: Decrease the amount of pain medication if you aren't having pain. Drink lots of decaffeinated fluids. Drink prune juice and/or each dried prunes  If the first 3 don't work start with additional solutions Take Colace - an over-the-counter stool softener Take Senokot - an over-the-counter laxative Take Miralax - a stronger over-the-counter laxative   Dental Antibiotics:  In most cases prophylactic antibiotics for Dental procdeures after total joint surgery are not necessary.  Exceptions are as follows:  1. History of prior total joint infection  2. Severely immunocompromised (Organ Transplant, cancer chemotherapy, Rheumatoid biologic meds such as [place])  3. Poorly controlled diabetes (A1C &gt; 8.0, blood glucose over 200)  If you have one of these conditions, contact your surgeon for an antibiotic prescription, prior to  your dental procedure.   For more information including helpful videos and documents visit our website:   [email or web address]    Post Anesthesia Home Care Instructions  Activity: Get plenty of rest for the remainder of the day. A responsible individual must stay with you for 24 hours following the procedure.  For the next 24 hours, DO NOT: -Drive a car -Paediatric nurse -Drink alcoholic beverages -Take any medication unless instructed by your physician -Make any legal decisions or sign important papers.  Meals: Start with liquid foods such as gelatin or soup. Progress to regular foods as tolerated. Avoid greasy, spicy, heavy foods. If nausea and/or vomiting occur, drink only clear liquids until the nausea and/or vomiting subsides. Call your physician if vomiting continues.  Special Instructions/Symptoms: Your throat may feel dry or sore from the anesthesia or the breathing tube placed in your throat during surgery. If  this causes discomfort, gargle with warm salt water. The discomfort should disappear within 24 hours.  If you had a scopolamine patch placed behind your ear for the management of post- operative nausea and/or vomiting:  1. The medication in the patch is effective for 72 hours, after which it should be removed.  Wrap patch in a tissue and discard in the trash. Wash hands thoroughly with soap and water. 2. You may remove the patch earlier than 72 hours if you experience unpleasant side effects which may include dry mouth, dizziness or visual disturbances. 3. Avoid touching the patch. Wash your hands with soap and water after contact with the patch.    Regional Anesthesia Blocks  1. Numbness or the inability to move the "blocked" extremity may last from 3-48 hours after placement. The length of time depends on the medication injected and your individual response to the medication. If the numbness is not going away after 48 hours, call  your surgeon.  2. The extremity that is blocked will need to be protected until the numbness is gone and the  Strength has returned. Because you cannot feel it, you will need to take extra care to avoid injury. Because it may be weak, you may have difficulty moving it or using it. You may not know what position it is in without looking at it while the block is in effect.  3. For blocks in the legs and feet, returning to weight bearing and walking needs to be done carefully. You will need to wait until the numbness is entirely gone and the strength has returned. You should be able to move your leg and foot normally before you try and bear weight or walk. You will need someone to be with you when you first try to ensure you do not fall and possibly risk injury.  4. Bruising and tenderness at the needle site are common side effects and will resolve in a few days.  5. Persistent numbness or new problems with movement should be communicated to the surgeon or the [facility] [place] [phone] [place] [phone]).

## 2021-03-20 NOTE — Transfer of Care (Signed)
Immediate Anesthesia Transfer of Care Note  Patient: Michele Hanson  Procedure(s) Performed: REVERSE SHOULDER ARTHROPLASTY (Right: Shoulder)  Patient Location: PACU  Anesthesia Type:General and Regional  Level of Consciousness: drowsy, patient cooperative and responds to stimulation  Airway & Oxygen Therapy: Patient Spontanous Breathing and Patient connected to face mask oxygen  Post-op Assessment: Report given to RN and Post -op Vital signs reviewed and stable  Post vital signs: Reviewed and stable  Last Vitals:  Vitals Value Taken Time  BP 132/78 03/20/21 1013  Temp    Pulse 90 03/20/21 1014  Resp 20 03/20/21 1014  SpO2 98 % 03/20/21 1014  Vitals shown include unvalidated device data.  Last Pain:  Vitals:   03/20/21 0711  TempSrc: Oral  PainSc: 0-No pain      Patients Stated Pain Goal: 2 (01/48/40 3979)  Complications: No notable events documented.

## 2021-03-22 ENCOUNTER — Encounter (HOSPITAL_BASED_OUTPATIENT_CLINIC_OR_DEPARTMENT_OTHER): Payer: Self-pay | Admitting: Orthopaedic Surgery

## 2021-03-31 ENCOUNTER — Other Ambulatory Visit: Payer: Self-pay | Admitting: Family Medicine

## 2021-03-31 DIAGNOSIS — F418 Other specified anxiety disorders: Secondary | ICD-10-CM

## 2021-04-17 ENCOUNTER — Encounter: Payer: Self-pay | Admitting: Physical Therapy

## 2021-04-17 ENCOUNTER — Ambulatory Visit: Payer: Medicare Other | Attending: Orthopaedic Surgery | Admitting: Physical Therapy

## 2021-04-17 ENCOUNTER — Other Ambulatory Visit: Payer: Self-pay

## 2021-04-17 DIAGNOSIS — M25611 Stiffness of right shoulder, not elsewhere classified: Secondary | ICD-10-CM | POA: Diagnosis present

## 2021-04-17 DIAGNOSIS — M25511 Pain in right shoulder: Secondary | ICD-10-CM | POA: Diagnosis present

## 2021-04-17 DIAGNOSIS — R293 Abnormal posture: Secondary | ICD-10-CM | POA: Diagnosis present

## 2021-04-17 DIAGNOSIS — R600 Localized edema: Secondary | ICD-10-CM | POA: Diagnosis present

## 2021-04-17 DIAGNOSIS — M6281 Muscle weakness (generalized): Secondary | ICD-10-CM | POA: Insufficient documentation

## 2021-04-17 NOTE — Therapy (Signed)
[place]. [place], [place], [address] Phone: [phone]   Fax:  [phone]  Physical Therapy Evaluation  Patient Details  Name: Michele Hanson MRN: [id number] Date of Birth: [birth year] Referring Provider (PT): Ophelia Charter   Encounter Date: 04/17/2021   PT End of Session - 04/17/21 1203     Visit Number 1    Number of Visits 21    Date for PT Re-Evaluation 06/12/21    Authorization Type [facility]    Authorization Time Period 04/17/21 to 06/12/21    Progress Note Due on Visit 10    PT Start Time 1107    PT Stop Time 1148    PT Time Calculation (min) 41 min    Activity Tolerance Patient tolerated treatment well    Behavior During Therapy [facility] for tasks assessed/performed             Past Medical History:  Diagnosis Date   Anxiety    Arthritis    Depression    Fatty liver disease, nonalcoholic    GERD (gastroesophageal reflux disease)    Hepatitis 1973   hep c-was treated   Hyperlipemia    Hypertension     Past Surgical History:  Procedure Laterality Date   BACK SURGERY     CARPAL TUNNEL RELEASE Bilateral    CHOLECYSTECTOMY     DIAGNOSTIC LAPAROSCOPY     INCONTINENCE SURGERY     LAPAROSCOPIC LYSIS OF ADHESIONS     REVERSE SHOULDER ARTHROPLASTY Right 03/20/2021   Procedure: REVERSE SHOULDER ARTHROPLASTY;  Surgeon: Hiram Gash, MD;  Location: [place];  Service: Orthopedics;  Laterality: Right;    There were no vitals filed for this visit.    Subjective Assessment - 04/17/21 1108     Subjective Had shoulder surgery for revere total shoulder 03/20/21. Shoulder had been hurting a lot after surgery, I took myself out of sling today as MD said I could after 4 weeks. Things have been going ok otherwise. I've had chronic neck pain, didn't seem to get worse with sling wear    Patient Stated Goals would like to get arm back to normal- as much as I can after the procedure    Currently  in Pain? Yes    Pain Score 2     Pain Location Shoulder    Pain Orientation Right    Pain Descriptors / Indicators Throbbing    Pain Type Acute pain    Pain Radiating Towards down RUE  to about elbow, sometimes goes all the way down to fingers    Pain Onset 1 to 4 weeks ago    Pain Frequency Constant    Aggravating Factors  actively using shoulder when cooking, using arm for various tasks, etc    Pain Relieving Factors pain pill    Effect of Pain on Daily Activities severe                [facility] PT Assessment - 04/17/21 0001       Assessment   Medical Diagnosis s/p reverse total shoulder    Referring Provider (PT) Ophelia Charter    Onset Date/Surgical Date 03/20/21    Next MD Visit Dr. Griffin Basil tomorrow    Prior Therapy PT in the past for her back, shoulder, foot      Precautions   Precautions Shoulder    Precaution Comments [name] protocol in chart under media      Restrictions  Weight Bearing Restrictions Yes    Other Position/Activity Restrictions less than 5 pounds R UE      Balance Screen   Has the patient fallen in the past 6 months No    Has the patient had a decrease in activity level because of a fear of falling?  Yes   when wearing certain shoes   Is the patient reluctant to leave their home because of a fear of falling?  No      Home Ecologist residence      Prior Function   Level of Independence Independent;Independent with basic ADLs    Vocation Retired    Visual merchandiser, pilates, swimming      Observation/Other Assessments   Focus on Therapeutic Outcomes (FOTO)  46      ROM / Strength   AROM / PROM / Strength AROM;Strength;PROM      AROM   AROM Assessment Site Shoulder;Elbow;Wrist    Right/Left Shoulder Right;Left    Right Shoulder Flexion 90 Degrees   supine   Right Shoulder ABduction 40 Degrees   supine   Right Shoulder External Rotation 30 Degrees   supine   Right/Left Elbow Right    Right/Left  Wrist Right      PROM   PROM Assessment Site Shoulder    Right/Left Shoulder Right    Right Shoulder Extension --   DNT per limits of protocol   Right Shoulder Flexion 90 Degrees    Right Shoulder ABduction 50 Degrees    Right Shoulder Internal Rotation --   DNT per limits of protocol   Right Shoulder External Rotation 35 Degrees      Strength   Overall Strength Comments grip strength symmetrical    Strength Assessment Site Elbow;Wrist    Right/Left Elbow Right;Left    Right Elbow Flexion 4+/5    Right Elbow Extension 4+/5    Right/Left Wrist Right;Left    Right Wrist Flexion 4+/5    Right Wrist Extension 4+/5                        Objective measurements completed on examination: See above findings.       [place] Adult PT Treatment/Exercise - 04/17/21 0001       Exercises   Exercises Shoulder      Shoulder Exercises: Seated   Other Seated Exercises scapular retractions 1x10 3 second holds, chin tucks 1x10 3 escond holds      Shoulder Exercises: Standing   Other Standing Exercises pendulums forward/backward, horizontal, CW/CCW within limits of protocol                     PT Education - 04/17/21 1202     Education Details current restrictions and limitations per MD protocol, HEP, POC moving forward    Person(s) Educated Patient    Methods Explanation;Handout;Demonstration    Comprehension Verbalized understanding;Returned demonstration              PT Short Term Goals - 04/17/21 1208       PT SHORT TERM GOAL #1   Title Will be compliant with appropriate progressive HEP    Time 4    Period Weeks    Status New    Target Date 05/15/21      PT SHORT TERM GOAL #2   Title R shoulder PROM and AAROM to be at least 120 degrees flexion and abduction, 45 degrees ER  Time 4    Period Weeks    Status New      PT SHORT TERM GOAL #3   Title Pain to be no more than 4/10 at worst    Time 4    Period Weeks    Status New      PT SHORT  TERM GOAL #4   Title Soft tissue restrictions in upper traps and biceps/anterior shoulder to be resolved in order to improve pain and ROM    Time 4    Period Weeks    Status New               PT Long Term Goals - 04/17/21 1210       PT LONG TERM GOAL #1   Title AROM R shoulder to be at least 150 degrees flexion and ABD without pain    Time 8    Period Weeks    Status New    Target Date 06/12/21      PT LONG TERM GOAL #2   Title MMT in R shoulder flexion and abduction to be at least 4/5    Time 8    Period Weeks    Status New      PT LONG TERM GOAL #3   Title Will tolerate initiation of active IR/extension ROM and strengthening R shoulder at 12 weeks as per protocol    Time 8    Period Weeks    Status New      PT LONG TERM GOAL #4   Title Will be able to dress and perform self hygiene with R UE without increased pain    Time 8    Period Weeks    Status New                    Plan - 04/17/21 1204     Clinical Impression Statement [name] arrives today doing well- had surgery on 12/7 and she took herself out of the sling today as she reports the surgeons office said this was OK after 4 weeks. Exam very typical for someone s/p reverse total shoulder surgery, including limited ROM and strength, postural impairments, and post-op pain and soft tissue restrictions. Discussed current restrictions and precautions, encouraged her to ask appropriate questions at surgical f/u tomorrow. Anticipate she will be here with [place] in PT for quite some time- will progress as able and appropriate as per surgical protocol.    Personal Factors and Comorbidities Past/Current Experience;Comorbidity 3+;Time since onset of injury/illness/exacerbation    Examination-Activity Limitations Bathing;Bed Mobility;Reach Overhead;Self Feeding;Caring for Others;Sleep;Carry;Dressing;Hygiene/Grooming;Lift;Toileting    Examination-Participation Restrictions Meal Prep;Cleaning;Community  Activity;Driving;Interpersonal Relationship;Shop;Laundry;Yard Work;Medication Management    Stability/Clinical Decision Making Stable/Uncomplicated    Clinical Decision Making Low    Rehab Potential Good    PT Frequency Other (comment)   3x/week for 4 weeks, then 2x/week for another 4 weeks   PT Duration 8 weeks    PT Treatment/Interventions ADLs/Self Care Home Management;Biofeedback;Cryotherapy;Electrical Stimulation;Iontophoresis 4mg /ml Dexamethasone;Moist Heat;Ultrasound;Functional mobility training;Therapeutic activities;Therapeutic exercise;Patient/family education;Manual techniques;Scar mobilization;Passive range of motion;Taping;Vasopneumatic Device    PT Next Visit Plan review HEP, can add more if she is able to do safely; NO IR OR EXTENSION UNTIL WEEK 12. Follow [name] protocol under media section of chart    PT [place] and Agree with Plan of Care Patient             Patient will benefit from skilled therapeutic intervention in order to  improve the following deficits and impairments:  Decreased range of motion, Increased fascial restricitons, Increased muscle spasms, Impaired UE functional use, Decreased activity tolerance, Decreased knowledge of precautions, Impaired perceived functional ability, Pain, Decreased scar mobility, Hypomobility, Improper body mechanics, Decreased strength, Postural dysfunction  Visit Diagnosis: Acute pain of right shoulder  Stiffness of right shoulder, not elsewhere classified  Muscle weakness (generalized)  Abnormal posture     Problem List Patient Active Problem List   Diagnosis Date Noted   De Quervain's tenosynovitis, right 07/22/2019   Abrasion of right knee 03/28/2019   Contusion of right chest wall 03/28/2019   Sprain of right wrist 03/28/2019   Bilateral carpal tunnel syndrome 03/17/2019   NAFLD (nonalcoholic fatty liver disease) 03/15/2019   Tendinitis of left rotator cuff 01/28/2019    Capsulitis of right shoulder 01/28/2019   Chronic right shoulder pain 01/21/2019   History of hepatitis C 01/21/2019   Joint pain in both hands 01/18/2019   Need for influenza vaccination 01/18/2019   Depression with anxiety 09/15/2018   Seasonal allergic rhinitis due to pollen 03/16/2018   B12 deficiency 07/16/2017   Neuropathy 07/15/2017   Spinal stenosis of lumbar region with neurogenic claudication 07/15/2017   Elevated cholesterol 07/15/2017   Healthcare maintenance 07/15/2017   Essential hypertension 07/15/2017   Abnormal CBC 07/15/2017   Monoclonal paraproteinemia 03/23/2012   Ann Lions PT, DPT, PN2   Supplemental Physical Therapist [place]    Pager [phone] Acute Rehab Office [place]. [place], [place], [address] Phone: [phone]   Fax:  [phone]  Name: Michele Hanson MRN: [id number] Date of Birth: June 12, 1950

## 2021-04-19 ENCOUNTER — Other Ambulatory Visit: Payer: Self-pay

## 2021-04-19 ENCOUNTER — Ambulatory Visit: Payer: Medicare Other | Admitting: Physical Therapy

## 2021-04-19 DIAGNOSIS — M25511 Pain in right shoulder: Secondary | ICD-10-CM

## 2021-04-19 DIAGNOSIS — M25611 Stiffness of right shoulder, not elsewhere classified: Secondary | ICD-10-CM

## 2021-04-19 NOTE — Therapy (Signed)
[place]. [place], [place], [address] Phone: [phone]   Fax:  [phone]  Physical Therapy Treatment  Patient Details  Name: Michele Hanson MRN: [id number] Date of Birth: [birth year] Referring Provider (PT): Ophelia Charter   Encounter Date: 04/19/2021   PT End of Session - 04/19/21 1005     Visit Number 2    Number of Visits 21    Date for PT Re-Evaluation 06/12/21    Authorization Type [employer] [facility]    Authorization Time Period 04/17/21 to 06/12/21    PT Start Time 0934    PT Stop Time 1020    PT Time Calculation (min) 46 min             Past Medical History:  Diagnosis Date   Anxiety    Arthritis    Depression    Fatty liver disease, nonalcoholic    GERD (gastroesophageal reflux disease)    Hepatitis 1973   hep c-was treated   Hyperlipemia    Hypertension     Past Surgical History:  Procedure Laterality Date   BACK SURGERY     CARPAL TUNNEL RELEASE Bilateral    CHOLECYSTECTOMY     DIAGNOSTIC LAPAROSCOPY     INCONTINENCE SURGERY     LAPAROSCOPIC LYSIS OF ADHESIONS     REVERSE SHOULDER ARTHROPLASTY Right 03/20/2021   Procedure: REVERSE SHOULDER ARTHROPLASTY;  Surgeon: Hiram Gash, MD;  Location: [place];  Service: Orthopedics;  Laterality: Right;    There were no vitals filed for this visit.   Subjective Assessment - 04/19/21 0934     Subjective saw MD and he was overall pleased,okayed me to get in pool. verb doing HEP without issue. MD felt having more pain than expected. spasms today    Currently in Pain? Yes    Pain Score 3     Pain Location Shoulder    Pain Orientation Right                               [facility] Adult PT Treatment/Exercise - 04/19/21 0001       Shoulder Exercises: Supine   External Rotation AAROM;Right;10 reps   with cane   Flexion AAROM;Both;10 reps   with cane   ABduction AAROM;10 reps;Right   with cane     Shoulder  Exercises: Standing   Horizontal ABduction AAROM;10 reps   counter slide   Internal Rotation --    Flexion AAROM;10 reps   counter slide   Other Standing Exercises AA bicep and upright row with cane 10 each      Modalities   Modalities Cryotherapy;Electrical Stimulation      Cryotherapy   Number Minutes Cryotherapy 15 Minutes    Cryotherapy Location Shoulder    Type of Cryotherapy Ice pack      Electrical Stimulation   Electrical Stimulation Location Rt shld    Electrical Stimulation Action IFC    Electrical Stimulation Parameters supine    Electrical Stimulation Goals Edema;Tone;Pain      Manual Therapy   Manual Therapy Joint mobilization;Passive ROM;Soft tissue mobilization    Manual therapy comments pain limited and guarded    Joint Mobilization grentle ossicalations and grade 1 to increase ROM    Soft tissue mobilization shld muscles with PROM to help relax to allow more ROM    Passive ROM fle,ER and abd  PT Short Term Goals - 04/19/21 1010       PT SHORT TERM GOAL #1   Title Will be compliant with appropriate progressive HEP    Status Partially Met               PT Long Term Goals - 04/17/21 1210       PT LONG TERM GOAL #1   Title AROM R shoulder to be at least 150 degrees flexion and ABD without pain    Time 8    Period Weeks    Status New    Target Date 06/12/21      PT LONG TERM GOAL #2   Title MMT in R shoulder flexion and abduction to be at least 4/5    Time 8    Period Weeks    Status New      PT LONG TERM GOAL #3   Title Will tolerate initiation of active IR/extension ROM and strengthening R shoulder at 12 weeks as per protocol    Time 8    Period Weeks    Status New      PT LONG TERM GOAL #4   Title Will be able to dress and perform self hygiene with R UE without increased pain    Time 8    Period Weeks    Status New                   Plan - 04/19/21 1006     Clinical Impression  Statement pt verb doing HEP. discussed pool walking letting arm swing naturally and she VU. progressed AA ex slowly to tolerance. PROM to tolerance- pt is very guarded and and muscle spasms present. added estim after to help relax shld    PT Treatment/Interventions ADLs/Self Care Home Management;Biofeedback;Cryotherapy;Electrical Stimulation;Iontophoresis 4mg /ml Dexamethasone;Moist Heat;Ultrasound;Functional mobility training;Therapeutic activities;Therapeutic exercise;Patient/family education;Manual techniques;Scar mobilization;Passive range of motion;Taping;Vasopneumatic Device    PT Next Visit Plan assess how she feels and add to HEP, can add more if she is able to do safely; NO IR OR EXTENSION UNTIL WEEK 12. Follow [name] protocol under media section of chart             Patient will benefit from skilled therapeutic intervention in order to improve the following deficits and impairments:  Decreased range of motion, Increased fascial restricitons, Increased muscle spasms, Impaired UE functional use, Decreased activity tolerance, Decreased knowledge of precautions, Impaired perceived functional ability, Pain, Decreased scar mobility, Hypomobility, Improper body mechanics, Decreased strength, Postural dysfunction  Visit Diagnosis: Acute pain of right shoulder  Stiffness of right shoulder, not elsewhere classified     Problem List Patient Active Problem List   Diagnosis Date Noted   De Quervain's tenosynovitis, right 07/22/2019   Abrasion of right knee 03/28/2019   Contusion of right chest wall 03/28/2019   Sprain of right wrist 03/28/2019   Bilateral carpal tunnel syndrome 03/17/2019   NAFLD (nonalcoholic fatty liver disease) 03/15/2019   Tendinitis of left rotator cuff 01/28/2019   Capsulitis of right shoulder 01/28/2019   Chronic right shoulder pain 01/21/2019   History of hepatitis C 01/21/2019   Joint pain in both hands 01/18/2019   Need for influenza vaccination  01/18/2019   Depression with anxiety 09/15/2018   Seasonal allergic rhinitis due to pollen 03/16/2018   B12 deficiency 07/16/2017   Neuropathy 07/15/2017   Spinal stenosis of lumbar region with neurogenic claudication 07/15/2017   Elevated cholesterol 07/15/2017   Healthcare maintenance 07/15/2017   Essential  hypertension 07/15/2017   Abnormal CBC 07/15/2017   Monoclonal paraproteinemia 03/23/2012    Jahna Liebert,ANGIE, PTA 04/19/2021, 10:10 AM  [place]. [place], [place], [address] Phone: [phone]   Fax:  [phone]  Name: Michele Hanson MRN: [id number] Date of Birth: 1950-05-17

## 2021-04-22 ENCOUNTER — Encounter: Payer: Self-pay | Admitting: Physical Therapy

## 2021-04-22 ENCOUNTER — Ambulatory Visit: Payer: Medicare Other | Admitting: Physical Therapy

## 2021-04-22 ENCOUNTER — Other Ambulatory Visit: Payer: Self-pay

## 2021-04-22 DIAGNOSIS — M25511 Pain in right shoulder: Secondary | ICD-10-CM

## 2021-04-22 DIAGNOSIS — M25611 Stiffness of right shoulder, not elsewhere classified: Secondary | ICD-10-CM

## 2021-04-22 DIAGNOSIS — R600 Localized edema: Secondary | ICD-10-CM

## 2021-04-22 DIAGNOSIS — M6281 Muscle weakness (generalized): Secondary | ICD-10-CM

## 2021-04-22 DIAGNOSIS — R293 Abnormal posture: Secondary | ICD-10-CM

## 2021-04-22 NOTE — Therapy (Signed)
[place]. [place], [place], [address] Phone: [phone]   Fax:  [phone]  Physical Therapy Treatment  Patient Details  Name: Michele Hanson MRN: [id number] Date of Birth: 06/08/1950 Referring Provider (PT): Ophelia Charter   Encounter Date: 04/22/2021   PT End of Session - 04/22/21 1555     Visit Number 3    Number of Visits 21    Date for PT Re-Evaluation 06/12/21    Authorization Type [facility]    PT Start Time 1518    PT Stop Time 1603    PT Time Calculation (min) 45 min    Activity Tolerance Patient tolerated treatment well    Behavior During Therapy [facility] for tasks assessed/performed             Past Medical History:  Diagnosis Date   Anxiety    Arthritis    Depression    Fatty liver disease, nonalcoholic    GERD (gastroesophageal reflux disease)    Hepatitis 1973   hep c-was treated   Hyperlipemia    Hypertension     Past Surgical History:  Procedure Laterality Date   BACK SURGERY     CARPAL TUNNEL RELEASE Bilateral    CHOLECYSTECTOMY     DIAGNOSTIC LAPAROSCOPY     INCONTINENCE SURGERY     LAPAROSCOPIC LYSIS OF ADHESIONS     REVERSE SHOULDER ARTHROPLASTY Right 03/20/2021   Procedure: REVERSE SHOULDER ARTHROPLASTY;  Surgeon: Hiram Gash, MD;  Location: [place];  Service: Orthopedics;  Laterality: Right;    There were no vitals filed for this visit.   Subjective Assessment - 04/22/21 1519     Subjective Feeling ok today    Currently in Pain? No/denies                               [facility] Adult PT Treatment/Exercise - 04/22/21 0001       Shoulder Exercises: Standing   Flexion AAROM;Both;10 reps   cane   ABduction Right;AAROM;10 reps   cane   Other Standing Exercises AAROM ball on table flex & circles x 10      Shoulder Exercises: ROM/Strengthening   UBE (Upper Arm Bike) L1 x 4 min      Modalities   Modalities Vasopneumatic       Vasopneumatic   Number Minutes Vasopneumatic  10 minutes    Vasopnuematic Location  Shoulder    Vasopneumatic Pressure Low    Vasopneumatic Temperature  34      Manual Therapy   Manual Therapy Joint mobilization;Passive ROM;Soft tissue mobilization    Manual therapy comments pain limited and guarded    Joint Mobilization grentle ossicalations and grade 1 to increase ROM    Soft tissue mobilization shld muscles with PROM to help relax to allow more ROM    Passive ROM fle,ER, scaption and abd                       PT Short Term Goals - 04/19/21 1010       PT SHORT TERM GOAL #1   Title Will be compliant with appropriate progressive HEP    Status Partially Met               PT Long Term Goals - 04/17/21 1210       PT LONG TERM GOAL #1   Title AROM R shoulder  to be at least 150 degrees flexion and ABD without pain    Time 8    Period Weeks    Status New    Target Date 06/12/21      PT LONG TERM GOAL #2   Title MMT in R shoulder flexion and abduction to be at least 4/5    Time 8    Period Weeks    Status New      PT LONG TERM GOAL #3   Title Will tolerate initiation of active IR/extension ROM and strengthening R shoulder at 12 weeks as per protocol    Time 8    Period Weeks    Status New      PT LONG TERM GOAL #4   Title Will be able to dress and perform self hygiene with R UE without increased pain    Time 8    Period Weeks    Status New                   Plan - 04/22/21 1555     Clinical Impression Statement Continues to progress with AAROM interventions to pt tolerance. Constant cue needed to relax with PROM. Muscle tissue density noted in the anterior deltoid and Pec of the R shoulder.    Personal Factors and Comorbidities Past/Current Experience;Comorbidity 3+;Time since onset of injury/illness/exacerbation    Examination-Activity Limitations Bathing;Bed Mobility;Reach Overhead;Self Feeding;Caring for  Others;Sleep;Carry;Dressing;Hygiene/Grooming;Lift;Toileting    Examination-Participation Restrictions Meal Prep;Cleaning;Community Activity;Driving;Interpersonal Relationship;Shop;Laundry;Yard Work;Medication Management    Stability/Clinical Decision Making Stable/Uncomplicated    Rehab Potential Good    PT Duration 8 weeks    PT Treatment/Interventions ADLs/Self Care Home Management;Biofeedback;Cryotherapy;Electrical Stimulation;Iontophoresis 4mg /ml Dexamethasone;Moist Heat;Ultrasound;Functional mobility training;Therapeutic activities;Therapeutic exercise;Patient/family education;Manual techniques;Scar mobilization;Passive range of motion;Taping;Vasopneumatic Device    PT Next Visit Plan assess how she feels and add to HEP, can add more if she is able to do safely; NO IR OR EXTENSION UNTIL WEEK 12. Follow [name] protocol under media section of chart             Patient will benefit from skilled therapeutic intervention in order to improve the following deficits and impairments:  Decreased range of motion, Increased fascial restricitons, Increased muscle spasms, Impaired UE functional use, Decreased activity tolerance, Decreased knowledge of precautions, Impaired perceived functional ability, Pain, Decreased scar mobility, Hypomobility, Improper body mechanics, Decreased strength, Postural dysfunction  Visit Diagnosis: Acute pain of right shoulder  Muscle weakness (generalized)  Abnormal posture  Stiffness of right shoulder, not elsewhere classified  Localized edema     Problem List Patient Active Problem List   Diagnosis Date Noted   De Quervain's tenosynovitis, right 07/22/2019   Abrasion of right knee 03/28/2019   Contusion of right chest wall 03/28/2019   Sprain of right wrist 03/28/2019   Bilateral carpal tunnel syndrome 03/17/2019   NAFLD (nonalcoholic fatty liver disease) 03/15/2019   Tendinitis of left rotator cuff 01/28/2019   Capsulitis of right shoulder  01/28/2019   Chronic right shoulder pain 01/21/2019   History of hepatitis C 01/21/2019   Joint pain in both hands 01/18/2019   Need for influenza vaccination 01/18/2019   Depression with anxiety 09/15/2018   Seasonal allergic rhinitis due to pollen 03/16/2018   B12 deficiency 07/16/2017   Neuropathy 07/15/2017   Spinal stenosis of lumbar region with neurogenic claudication 07/15/2017   Elevated cholesterol 07/15/2017   Healthcare maintenance 07/15/2017   Essential hypertension 07/15/2017   Abnormal CBC 07/15/2017   Monoclonal paraproteinemia 03/23/2012  Scot Jun, PTA 04/22/2021, 3:58 PM  [place]. [place], [place], [address] Phone: [phone]   Fax:  [phone]  Name: Michele Hanson MRN: [id number] Date of Birth: 1950-07-21

## 2021-04-24 ENCOUNTER — Ambulatory Visit: Payer: Medicare Other | Admitting: Physical Therapy

## 2021-04-24 ENCOUNTER — Other Ambulatory Visit: Payer: Self-pay

## 2021-04-24 ENCOUNTER — Encounter: Payer: Self-pay | Admitting: Physical Therapy

## 2021-04-24 DIAGNOSIS — M25511 Pain in right shoulder: Secondary | ICD-10-CM

## 2021-04-24 DIAGNOSIS — R600 Localized edema: Secondary | ICD-10-CM

## 2021-04-24 DIAGNOSIS — R293 Abnormal posture: Secondary | ICD-10-CM

## 2021-04-24 DIAGNOSIS — M6281 Muscle weakness (generalized): Secondary | ICD-10-CM

## 2021-04-24 DIAGNOSIS — M25611 Stiffness of right shoulder, not elsewhere classified: Secondary | ICD-10-CM

## 2021-04-24 NOTE — Therapy (Signed)
[place]. [place], [place], [address] Phone: [phone]   Fax:  [phone]  Physical Therapy Treatment  Patient Details  Name: Michele Hanson MRN: [id number] Date of Birth: Mar 06, 1951 Referring Provider (PT): Ophelia Charter   Encounter Date: 04/24/2021   PT End of Session - 04/24/21 1505     Visit Number 4    Date for PT Re-Evaluation 06/12/21    Authorization Type [facility] MCR    PT Start Time 1430    PT Stop Time 1515    PT Time Calculation (min) 45 min    Activity Tolerance Patient tolerated treatment well    Behavior During Therapy [facility] for tasks assessed/performed             Past Medical History:  Diagnosis Date   Anxiety    Arthritis    Depression    Fatty liver disease, nonalcoholic    GERD (gastroesophageal reflux disease)    Hepatitis 1973   hep c-was treated   Hyperlipemia    Hypertension     Past Surgical History:  Procedure Laterality Date   BACK SURGERY     CARPAL TUNNEL RELEASE Bilateral    CHOLECYSTECTOMY     DIAGNOSTIC LAPAROSCOPY     INCONTINENCE SURGERY     LAPAROSCOPIC LYSIS OF ADHESIONS     REVERSE SHOULDER ARTHROPLASTY Right 03/20/2021   Procedure: REVERSE SHOULDER ARTHROPLASTY;  Surgeon: Hiram Gash, MD;  Location: [place];  Service: Orthopedics;  Laterality: Right;    There were no vitals filed for this visit.   Subjective Assessment - 04/24/21 1431     Subjective "Im doing ok, I have a little pain today"    Currently in Pain? Yes    Pain Score 2     Pain Location Shoulder    Pain Orientation Right                               [facility] Adult PT Treatment/Exercise - 04/24/21 0001       Shoulder Exercises: Standing   Flexion AAROM;Both;20 reps   cane   ABduction Right;AAROM;10 reps   cane   Other Standing Exercises RUE 2 level cabinet reaches x10, 1 level abduction x10    Other Standing Exercises biceps curls 1lb 2x10       Shoulder Exercises: ROM/Strengthening   UBE (Upper Arm Bike) L1 x 4 min each      Shoulder Exercises: Isometric Strengthening   Flexion 5X5"    External Rotation 3X5"    ADduction 3X5"      Vasopneumatic   Number Minutes Vasopneumatic  10 minutes    Vasopnuematic Location  Shoulder    Vasopneumatic Pressure Low    Vasopneumatic Temperature  34      Manual Therapy   Manual Therapy Joint mobilization;Passive ROM;Soft tissue mobilization    Soft tissue mobilization shld muscles with PROM to help relax to allow more ROM    Passive ROM fle,ER, scaption and abd                       PT Short Term Goals - 04/19/21 1010       PT SHORT TERM GOAL #1   Title Will be compliant with appropriate progressive HEP    Status Partially Met               PT Long Term  Goals - 04/17/21 1210       PT LONG TERM GOAL #1   Title AROM R shoulder to be at least 150 degrees flexion and ABD without pain    Time 8    Period Weeks    Status New    Target Date 06/12/21      PT LONG TERM GOAL #2   Title MMT in R shoulder flexion and abduction to be at least 4/5    Time 8    Period Weeks    Status New      PT LONG TERM GOAL #3   Title Will tolerate initiation of active IR/extension ROM and strengthening R shoulder at 12 weeks as per protocol    Time 8    Period Weeks    Status New      PT LONG TERM GOAL #4   Title Will be able to dress and perform self hygiene with R UE without increased pain    Time 8    Period Weeks    Status New                   Plan - 04/24/21 1505     Clinical Impression Statement Pt 5 weeks post reverse total shoulder. initiated some AROM of the RUE flexion and abduction using cabinet reached. Cue given to pt to avoid a more scaption motion with flexion. Pt remains guarded at times with PROM requiring cues to relax.    Personal Factors and Comorbidities Past/Current Experience;Comorbidity 3+;Time since onset of injury/illness/exacerbation     Examination-Activity Limitations Bathing;Bed Mobility;Reach Overhead;Self Feeding;Caring for Others;Sleep;Carry;Dressing;Hygiene/Grooming;Lift;Toileting    Examination-Participation Restrictions Meal Prep;Cleaning;Community Activity;Driving;Interpersonal Relationship;Shop;Laundry;Yard Work;Medication Management    Stability/Clinical Decision Making Stable/Uncomplicated    Rehab Potential Good    PT Duration 8 weeks    PT Treatment/Interventions ADLs/Self Care Home Management;Biofeedback;Cryotherapy;Electrical Stimulation;Iontophoresis 34m/ml Dexamethasone;Moist Heat;Ultrasound;Functional mobility training;Therapeutic activities;Therapeutic exercise;Patient/family education;Manual techniques;Scar mobilization;Passive range of motion;Taping;Vasopneumatic Device             Patient will benefit from skilled therapeutic intervention in order to improve the following deficits and impairments:  Decreased range of motion, Increased fascial restricitons, Increased muscle spasms, Impaired UE functional use, Decreased activity tolerance, Decreased knowledge of precautions, Impaired perceived functional ability, Pain, Decreased scar mobility, Hypomobility, Improper body mechanics, Decreased strength, Postural dysfunction  Visit Diagnosis: Acute pain of right shoulder  Muscle weakness (generalized)  Abnormal posture  Stiffness of right shoulder, not elsewhere classified  Localized edema     Problem List Patient Active Problem List   Diagnosis Date Noted   De Quervain's tenosynovitis, right 07/22/2019   Abrasion of right knee 03/28/2019   Contusion of right chest wall 03/28/2019   Sprain of right wrist 03/28/2019   Bilateral carpal tunnel syndrome 03/17/2019   NAFLD (nonalcoholic fatty liver disease) 03/15/2019   Tendinitis of left rotator cuff 01/28/2019   Capsulitis of right shoulder 01/28/2019   Chronic right shoulder pain 01/21/2019   History of hepatitis C 01/21/2019   Joint  pain in both hands 01/18/2019   Need for influenza vaccination 01/18/2019   Depression with anxiety 09/15/2018   Seasonal allergic rhinitis due to pollen 03/16/2018   B12 deficiency 07/16/2017   Neuropathy 07/15/2017   Spinal stenosis of lumbar region with neurogenic claudication 07/15/2017   Elevated cholesterol 07/15/2017   Healthcare maintenance 07/15/2017   Essential hypertension 07/15/2017   Abnormal CBC 07/15/2017   Monoclonal paraproteinemia 03/23/2012    RScot Jun PTA 04/24/2021, 3:08 PM  [place]. [place], [place], [address] Phone: [phone]   Fax:  [phone]  Name: Jaden Batchelder MRN: [id number] Date of Birth: 01/04/1951

## 2021-04-26 ENCOUNTER — Encounter: Payer: Self-pay | Admitting: Physical Therapy

## 2021-04-26 ENCOUNTER — Other Ambulatory Visit: Payer: Self-pay

## 2021-04-26 ENCOUNTER — Ambulatory Visit: Payer: Medicare Other | Admitting: Physical Therapy

## 2021-04-26 DIAGNOSIS — M6281 Muscle weakness (generalized): Secondary | ICD-10-CM

## 2021-04-26 DIAGNOSIS — M25511 Pain in right shoulder: Secondary | ICD-10-CM

## 2021-04-26 DIAGNOSIS — R600 Localized edema: Secondary | ICD-10-CM

## 2021-04-26 DIAGNOSIS — M25611 Stiffness of right shoulder, not elsewhere classified: Secondary | ICD-10-CM

## 2021-04-26 DIAGNOSIS — R293 Abnormal posture: Secondary | ICD-10-CM

## 2021-04-26 NOTE — Therapy (Signed)
[place]. [place], [place], [address] Phone: [phone]   Fax:  [phone]  Physical Therapy Treatment  Patient Details  Name: Michele Hanson MRN: [id number] Date of Birth: February 28, 1951 Referring Provider (PT): Ophelia Charter   Encounter Date: 04/26/2021   PT End of Session - 04/26/21 1054     Visit Number 5    Date for PT Re-Evaluation 06/12/21    Authorization Type [facility] MCR    Authorization Time Period 04/17/21 to 06/12/21    PT Start Time 1015    PT Stop Time 1105    PT Time Calculation (min) 50 min    Activity Tolerance Patient tolerated treatment well    Behavior During Therapy [facility] for tasks assessed/performed             Past Medical History:  Diagnosis Date   Anxiety    Arthritis    Depression    Fatty liver disease, nonalcoholic    GERD (gastroesophageal reflux disease)    Hepatitis 1973   hep c-was treated   Hyperlipemia    Hypertension     Past Surgical History:  Procedure Laterality Date   BACK SURGERY     CARPAL TUNNEL RELEASE Bilateral    CHOLECYSTECTOMY     DIAGNOSTIC LAPAROSCOPY     INCONTINENCE SURGERY     LAPAROSCOPIC LYSIS OF ADHESIONS     REVERSE SHOULDER ARTHROPLASTY Right 03/20/2021   Procedure: REVERSE SHOULDER ARTHROPLASTY;  Surgeon: Hiram Gash, MD;  Location: [place];  Service: Orthopedics;  Laterality: Right;    There were no vitals filed for this visit.   Subjective Assessment - 04/26/21 1017     Subjective "Tired, I did not sleep well last night"    Currently in Pain? Yes    Pain Score 2     Pain Location Shoulder    Pain Orientation Right                               [facility] Adult PT Treatment/Exercise - 04/26/21 0001       Shoulder Exercises: Standing   Flexion AAROM;Both;20 reps   cane   ABduction Right;AAROM;10 reps;5 reps   cane   Other Standing Exercises RUE 2 level cabinet reaches x10, 1 level abduction x10       Shoulder Exercises: ROM/Strengthening   UBE (Upper Arm Bike) L1 x 3 min each      Shoulder Exercises: Isometric Strengthening   Flexion 5X5"    ADduction 5X5"      Vasopneumatic   Number Minutes Vasopneumatic  10 minutes    Vasopnuematic Location  Shoulder    Vasopneumatic Pressure Low    Vasopneumatic Temperature  34      Manual Therapy   Manual Therapy Joint mobilization;Passive ROM;Soft tissue mobilization    Soft tissue mobilization shld muscles with PROM to help relax to allow more ROM    Passive ROM fle,ER, scaption and abd                       PT Short Term Goals - 04/19/21 1010       PT SHORT TERM GOAL #1   Title Will be compliant with appropriate progressive HEP    Status Partially Met               PT Long Term Goals - 04/17/21 1210  PT LONG TERM GOAL #1   Title AROM R shoulder to be at least 150 degrees flexion and ABD without pain    Time 8    Period Weeks    Status New    Target Date 06/12/21      PT LONG TERM GOAL #2   Title MMT in R shoulder flexion and abduction to be at least 4/5    Time 8    Period Weeks    Status New      PT LONG TERM GOAL #3   Title Will tolerate initiation of active IR/extension ROM and strengthening R shoulder at 12 weeks as per protocol    Time 8    Period Weeks    Status New      PT LONG TERM GOAL #4   Title Will be able to dress and perform self hygiene with R UE without increased pain    Time 8    Period Weeks    Status New                   Plan - 04/26/21 1054     Clinical Impression Statement Good carryover form last session progressing with AROM of R shoulder. Some pain at discomfort towards her end range of flexion and abduction. Cue to relax required with PROM. Some R shoulder elevation with flexion and abduction,    Personal Factors and Comorbidities Past/Current Experience;Comorbidity 3+;Time since onset of injury/illness/exacerbation    Examination-Activity  Limitations Bathing;Bed Mobility;Reach Overhead;Self Feeding;Caring for Others;Sleep;Carry;Dressing;Hygiene/Grooming;Lift;Toileting    Examination-Participation Restrictions Meal Prep;Cleaning;Community Activity;Driving;Interpersonal Relationship;Shop;Laundry;Yard Work;Medication Management    Stability/Clinical Decision Making Stable/Uncomplicated    Rehab Potential Good    PT Duration 8 weeks    PT Treatment/Interventions ADLs/Self Care Home Management;Biofeedback;Cryotherapy;Electrical Stimulation;Iontophoresis 31m/ml Dexamethasone;Moist Heat;Ultrasound;Functional mobility training;Therapeutic activities;Therapeutic exercise;Patient/family education;Manual techniques;Scar mobilization;Passive range of motion;Taping;Vasopneumatic Device    PT Next Visit Plan assess how she feels and add to HEP, can add more if she is able to do safely; NO IR OR EXTENSION UNTIL WEEK 12. Follow MRaliegh Ipprotocol under media section of chart             Patient will benefit from skilled therapeutic intervention in order to improve the following deficits and impairments:  Decreased range of motion, Increased fascial restricitons, Increased muscle spasms, Impaired UE functional use, Decreased activity tolerance, Decreased knowledge of precautions, Impaired perceived functional ability, Pain, Decreased scar mobility, Hypomobility, Improper body mechanics, Decreased strength, Postural dysfunction  Visit Diagnosis: Muscle weakness (generalized)  Acute pain of right shoulder  Abnormal posture  Stiffness of right shoulder, not elsewhere classified  Localized edema     Problem List Patient Active Problem List   Diagnosis Date Noted   De Quervain's tenosynovitis, right 07/22/2019   Abrasion of right knee 03/28/2019   Contusion of right chest wall 03/28/2019   Sprain of right wrist 03/28/2019   Bilateral carpal tunnel syndrome 03/17/2019   NAFLD (nonalcoholic fatty liver disease) 03/15/2019    Tendinitis of left rotator cuff 01/28/2019   Capsulitis of right shoulder 01/28/2019   Chronic right shoulder pain 01/21/2019   History of hepatitis C 01/21/2019   Joint pain in both hands 01/18/2019   Need for influenza vaccination 01/18/2019   Depression with anxiety 09/15/2018   Seasonal allergic rhinitis due to pollen 03/16/2018   B12 deficiency 07/16/2017   Neuropathy 07/15/2017   Spinal stenosis of lumbar region with neurogenic claudication 07/15/2017   Elevated cholesterol 07/15/2017   Healthcare maintenance 07/15/2017  Essential hypertension 07/15/2017   Abnormal CBC 07/15/2017   Monoclonal paraproteinemia 03/23/2012    Scot Jun, PTA 04/26/2021, 10:57 AM  [place]. [place], [place], [address] Phone: [phone]   Fax:  [phone]  Name: Michele Hanson MRN: [id number] Date of Birth: Oct 14, 1950

## 2021-04-29 ENCOUNTER — Ambulatory Visit: Payer: Medicare Other | Admitting: Physical Therapy

## 2021-04-29 ENCOUNTER — Other Ambulatory Visit: Payer: Self-pay

## 2021-04-29 ENCOUNTER — Other Ambulatory Visit: Payer: Self-pay | Admitting: Family Medicine

## 2021-04-29 ENCOUNTER — Encounter: Payer: Self-pay | Admitting: Physical Therapy

## 2021-04-29 DIAGNOSIS — M25611 Stiffness of right shoulder, not elsewhere classified: Secondary | ICD-10-CM

## 2021-04-29 DIAGNOSIS — M25511 Pain in right shoulder: Secondary | ICD-10-CM

## 2021-04-29 DIAGNOSIS — R293 Abnormal posture: Secondary | ICD-10-CM

## 2021-04-29 DIAGNOSIS — M25541 Pain in joints of right hand: Secondary | ICD-10-CM

## 2021-04-29 DIAGNOSIS — R600 Localized edema: Secondary | ICD-10-CM

## 2021-04-29 DIAGNOSIS — M25542 Pain in joints of left hand: Secondary | ICD-10-CM

## 2021-04-29 DIAGNOSIS — G8929 Other chronic pain: Secondary | ICD-10-CM

## 2021-04-29 DIAGNOSIS — M6281 Muscle weakness (generalized): Secondary | ICD-10-CM

## 2021-04-29 NOTE — Therapy (Signed)
[place]. [place], [place], [address] Phone: [phone]   Fax:  [phone]  Physical Therapy Treatment  Patient Details  Name: Michele Hanson MRN: [id number] Date of Birth: [birth year] Referring Provider (PT): Ophelia Charter   Encounter Date: 04/29/2021   PT End of Session - 04/29/21 1528     Visit Number 6    Number of Visits 21    Date for PT Re-Evaluation 06/12/21    Authorization Type [facility]    Authorization Time Period 04/17/21 to 06/12/21    PT Start Time 1448    PT Stop Time 1527    PT Time Calculation (min) 39 min    Activity Tolerance Patient tolerated treatment well    Behavior During Therapy [facility] for tasks assessed/performed             Past Medical History:  Diagnosis Date   Anxiety    Arthritis    Depression    Fatty liver disease, nonalcoholic    GERD (gastroesophageal reflux disease)    Hepatitis 1973   hep c-was treated   Hyperlipemia    Hypertension     Past Surgical History:  Procedure Laterality Date   BACK SURGERY     CARPAL TUNNEL RELEASE Bilateral    CHOLECYSTECTOMY     DIAGNOSTIC LAPAROSCOPY     INCONTINENCE SURGERY     LAPAROSCOPIC LYSIS OF ADHESIONS     REVERSE SHOULDER ARTHROPLASTY Right 03/20/2021   Procedure: REVERSE SHOULDER ARTHROPLASTY;  Surgeon: Hiram Gash, MD;  Location: [place];  Service: Orthopedics;  Laterality: Right;    There were no vitals filed for this visit.   Subjective Assessment - 04/29/21 1450     Subjective I'm having a lot of mm tightness and it feels like spasm. Still having issues with getting good sleep, sleeping with HOB flat for now. I'm taking care of my 2 dogs and its going OK.    Patient Stated Goals would like to get arm back to normal- as much as I can after the procedure    Currently in Pain? Yes    Pain Score 2     Pain Location Shoulder    Pain Orientation Right    Pain Descriptors / Indicators  Aching;Throbbing    Pain Type Acute pain                               [facility] Adult PT Treatment/Exercise - 04/29/21 0001       Shoulder Exercises: Supine   Flexion AAROM;Right;20 reps    ABduction AAROM;Right;20 reps    Other Supine Exercises isometric flexion and abduction 1x10      Manual Therapy   Manual Therapy Soft tissue mobilization    Soft tissue mobilization R upper trap and levator, ant/middle/post deltoid, anterior bicep/proximal inner arm to tolerance; grade II GH mobs and oscillation                    PT Education - 04/29/21 1528     Education Details exercise form, reviewed precautions, possible benefits of DN    Person(s) Educated Patient    Methods Explanation    Comprehension Verbalized understanding              PT Short Term Goals - 04/19/21 1010       PT SHORT TERM GOAL #1   Title Will be compliant  with appropriate progressive HEP    Status Partially Met               PT Long Term Goals - 04/17/21 1210       PT LONG TERM GOAL #1   Title AROM R shoulder to be at least 150 degrees flexion and ABD without pain    Time 8    Period Weeks    Status New    Target Date 06/12/21      PT LONG TERM GOAL #2   Title MMT in R shoulder flexion and abduction to be at least 4/5    Time 8    Period Weeks    Status New      PT LONG TERM GOAL #3   Title Will tolerate initiation of active IR/extension ROM and strengthening R shoulder at 12 weeks as per protocol    Time 8    Period Weeks    Status New      PT LONG TERM GOAL #4   Title Will be able to dress and perform self hygiene with R UE without increased pain    Time 8    Period Weeks    Status New                   Plan - 04/29/21 1529     Clinical Impression Statement [name] arrives today doing well, having a  lot of stiffness and throbbing around the shoulder and her neck. We spent a large part of todays session working on STM and trigger point  release with about 40-50% release of impaired areas noted. Otherwise kept working on [employer] worker, community. Will continue to progress as tolerated per protocol.    Personal Factors and Comorbidities Past/Current Experience;Comorbidity 3+;Time since onset of injury/illness/exacerbation    Examination-Activity Limitations Bathing;Bed Mobility;Reach Overhead;Self Feeding;Caring for Others;Sleep;Carry;Dressing;Hygiene/Grooming;Lift;Toileting    Examination-Participation Restrictions Meal Prep;Cleaning;Community Activity;Driving;Interpersonal Relationship;Shop;Laundry;Yard Work;Medication Management    Stability/Clinical Decision Making Stable/Uncomplicated    Clinical Decision Making Low    Rehab Potential Good    PT Duration 8 weeks    PT Treatment/Interventions ADLs/Self Care Home Management;Biofeedback;Cryotherapy;Electrical Stimulation;Iontophoresis 4mg /ml Dexamethasone;Moist Heat;Ultrasound;Functional mobility training;Therapeutic activities;Therapeutic exercise;Patient/family education;Manual techniques;Scar mobilization;Passive range of motion;Taping;Vasopneumatic Device    PT Next Visit Plan assess how she feels and add to HEP, can add more if she is able to do safely; NO IR OR EXTENSION UNTIL WEEK 12. Follow [name] protocol under media section of chart. May benefit from DN    PT [place] and Agree with Plan of Care Patient             Patient will benefit from skilled therapeutic intervention in order to improve the following deficits and impairments:  Decreased range of motion, Increased fascial restricitons, Increased muscle spasms, Impaired UE functional use, Decreased activity tolerance, Decreased knowledge of precautions, Impaired perceived functional ability, Pain, Decreased scar mobility, Hypomobility, Improper body mechanics, Decreased strength, Postural dysfunction  Visit Diagnosis: Muscle weakness (generalized)  Acute pain of  right shoulder  Abnormal posture  Stiffness of right shoulder, not elsewhere classified  Localized edema     Problem List Patient Active Problem List   Diagnosis Date Noted   De Quervain's tenosynovitis, right 07/22/2019   Abrasion of right knee 03/28/2019   Contusion of right chest wall 03/28/2019   Sprain of right wrist 03/28/2019   Bilateral carpal tunnel syndrome 03/17/2019   NAFLD (nonalcoholic fatty liver disease) 03/15/2019  Tendinitis of left rotator cuff 01/28/2019   Capsulitis of right shoulder 01/28/2019   Chronic right shoulder pain 01/21/2019   History of hepatitis C 01/21/2019   Joint pain in both hands 01/18/2019   Need for influenza vaccination 01/18/2019   Depression with anxiety 09/15/2018   Seasonal allergic rhinitis due to pollen 03/16/2018   B12 deficiency 07/16/2017   Neuropathy 07/15/2017   Spinal stenosis of lumbar region with neurogenic claudication 07/15/2017   Elevated cholesterol 07/15/2017   Healthcare maintenance 07/15/2017   Essential hypertension 07/15/2017   Abnormal CBC 07/15/2017   Monoclonal paraproteinemia 03/23/2012   Ann Lions PT, DPT, PN2   Supplemental Physical Therapist [facility]    Pager [phone] Acute Rehab Office [place]. [place], [place], [address] Phone: [phone]   Fax:  [phone]  Name: Kimberla Driskill MRN: [id number] Date of Birth: 1950/12/17

## 2021-05-01 ENCOUNTER — Encounter: Payer: Self-pay | Admitting: Physical Therapy

## 2021-05-01 ENCOUNTER — Other Ambulatory Visit: Payer: Self-pay

## 2021-05-01 ENCOUNTER — Ambulatory Visit: Payer: Medicare Other | Admitting: Physical Therapy

## 2021-05-01 DIAGNOSIS — M6281 Muscle weakness (generalized): Secondary | ICD-10-CM

## 2021-05-01 DIAGNOSIS — M25511 Pain in right shoulder: Secondary | ICD-10-CM | POA: Diagnosis not present

## 2021-05-01 DIAGNOSIS — R600 Localized edema: Secondary | ICD-10-CM

## 2021-05-01 DIAGNOSIS — R293 Abnormal posture: Secondary | ICD-10-CM

## 2021-05-01 DIAGNOSIS — M25611 Stiffness of right shoulder, not elsewhere classified: Secondary | ICD-10-CM

## 2021-05-01 NOTE — Therapy (Signed)
[facility] [place], [place], [address] Phone: [phone]   Fax:  [phone]  Physical Therapy Treatment  Patient Details  Name: Michele Hanson MRN: [id number] Date of Birth: 11/07/1950 Referring Provider (PT): Ophelia Charter   Encounter Date: 05/01/2021   PT End of Session - 05/01/21 1424     Visit Number 7    Date for PT Re-Evaluation 06/12/21    PT Start Time 1345    PT Stop Time 1435    PT Time Calculation (min) 50 min    Activity Tolerance Patient tolerated treatment well    Behavior During Therapy [facility] for tasks assessed/performed             Past Medical History:  Diagnosis Date   Anxiety    Arthritis    Depression    Fatty liver disease, nonalcoholic    GERD (gastroesophageal reflux disease)    Hepatitis 1973   hep c-was treated   Hyperlipemia    Hypertension     Past Surgical History:  Procedure Laterality Date   BACK SURGERY     CARPAL TUNNEL RELEASE Bilateral    CHOLECYSTECTOMY     DIAGNOSTIC LAPAROSCOPY     INCONTINENCE SURGERY     LAPAROSCOPIC LYSIS OF ADHESIONS     REVERSE SHOULDER ARTHROPLASTY Right 03/20/2021   Procedure: REVERSE SHOULDER ARTHROPLASTY;  Surgeon: Hiram Gash, MD;  Location: [place];  Service: Orthopedics;  Laterality: Right;    There were no vitals filed for this visit.   Subjective Assessment - 05/01/21 1351     Subjective Still have periods of shoulder soreness.    Currently in Pain? Yes    Pain Score 1     Pain Location Shoulder    Pain Orientation Right                               [facility] Adult PT Treatment/Exercise - 05/01/21 0001       Shoulder Exercises: Standing   Other Standing Exercises RUE 2 level cabinet reaches x10, 1 level abduction x10      Shoulder Exercises: ROM/Strengthening   UBE (Upper Arm Bike) L1 x 3 min each    Other ROM/Strengthening Exercises Wall Flex and abd pilow case x10 each       Shoulder Exercises: Isometric Strengthening   Flexion 5X5"    External Rotation 3X5"    ADduction 5X5"      Vasopneumatic   Number Minutes Vasopneumatic  10 minutes    Vasopnuematic Location  Shoulder    Vasopneumatic Pressure Low    Vasopneumatic Temperature  34      Manual Therapy   Manual Therapy Soft tissue mobilization    Soft tissue mobilization R upper trap and levator, ant/middle/post deltoid, anterior bicep/proximal inner arm to tolerance    Passive ROM fle,ER, scaption and abd                       PT Short Term Goals - 04/19/21 1010       PT SHORT TERM GOAL #1   Title Will be compliant with appropriate progressive HEP    Status Partially Met               PT Long Term Goals - 04/17/21 1210       PT LONG TERM GOAL #1   Title AROM R shoulder to  be at least 150 degrees flexion and ABD without pain    Time 8    Period Weeks    Status New    Target Date 06/12/21      PT LONG TERM GOAL #2   Title MMT in R shoulder flexion and abduction to be at least 4/5    Time 8    Period Weeks    Status New      PT LONG TERM GOAL #3   Title Will tolerate initiation of active IR/extension ROM and strengthening R shoulder at 12 weeks as per protocol    Time 8    Period Weeks    Status New      PT LONG TERM GOAL #4   Title Will be able to dress and perform self hygiene with R UE without increased pain    Time 8    Period Weeks    Status New                   Plan - 05/01/21 1424     Clinical Impression Statement Continues focus on MT and AAROM or the RUE. She continues to reports R shoulder soreness with activity. She give good effort throughout treatment, R shoulder abduction is the most difficult for patient. Cue  needed to relax with PROM and range stretching.    Personal Factors and Comorbidities Past/Current Experience;Comorbidity 3+;Time since onset of injury/illness/exacerbation    Examination-Activity Limitations Bathing;Bed  Mobility;Reach Overhead;Self Feeding;Caring for Others;Sleep;Carry;Dressing;Hygiene/Grooming;Lift;Toileting    Examination-Participation Restrictions Meal Prep;Cleaning;Community Activity;Driving;Interpersonal Relationship;Shop;Laundry;Yard Work;Medication Management    Rehab Potential Good    PT Duration 8 weeks    PT Treatment/Interventions ADLs/Self Care Home Management;Biofeedback;Cryotherapy;Electrical Stimulation;Iontophoresis 4mg /ml Dexamethasone;Moist Heat;Ultrasound;Functional mobility training;Therapeutic activities;Therapeutic exercise;Patient/family education;Manual techniques;Scar mobilization;Passive range of motion;Taping;Vasopneumatic Device    PT Next Visit Plan assess how she feel,  can add more if she is able to do safely; NO IR OR EXTENSION UNTIL WEEK 12. Follow [name] protocol under media section of chart. May benefit from DN             Patient will benefit from skilled therapeutic intervention in order to improve the following deficits and impairments:  Decreased range of motion, Increased fascial restricitons, Increased muscle spasms, Impaired UE functional use, Decreased activity tolerance, Decreased knowledge of precautions, Impaired perceived functional ability, Pain, Decreased scar mobility, Hypomobility, Improper body mechanics, Decreased strength, Postural dysfunction  Visit Diagnosis: Muscle weakness (generalized)  Abnormal posture  Stiffness of right shoulder, not elsewhere classified  Acute pain of right shoulder  Localized edema     Problem List Patient Active Problem List   Diagnosis Date Noted   De Quervain's tenosynovitis, right 07/22/2019   Abrasion of right knee 03/28/2019   Contusion of right chest wall 03/28/2019   Sprain of right wrist 03/28/2019   Bilateral carpal tunnel syndrome 03/17/2019   NAFLD (nonalcoholic fatty liver disease) 03/15/2019   Tendinitis of left rotator cuff 01/28/2019   Capsulitis of right shoulder 01/28/2019    Chronic right shoulder pain 01/21/2019   History of hepatitis C 01/21/2019   Joint pain in both hands 01/18/2019   Need for influenza vaccination 01/18/2019   Depression with anxiety 09/15/2018   Seasonal allergic rhinitis due to pollen 03/16/2018   B12 deficiency 07/16/2017   Neuropathy 07/15/2017   Spinal stenosis of lumbar region with neurogenic claudication 07/15/2017   Elevated cholesterol 07/15/2017   Healthcare maintenance 07/15/2017   Essential hypertension 07/15/2017   Abnormal CBC 07/15/2017  Monoclonal paraproteinemia 03/23/2012    Scot Jun, PTA 05/01/2021, 2:27 PM  [place]. [place], [place], [address] Phone: [phone]   Fax:  [phone]  Name: Michele Hanson MRN: [id number] Date of Birth: 07-27-1950

## 2021-05-03 ENCOUNTER — Other Ambulatory Visit: Payer: Self-pay

## 2021-05-03 ENCOUNTER — Ambulatory Visit: Payer: Medicare Other | Admitting: Physical Therapy

## 2021-05-03 ENCOUNTER — Encounter: Payer: Self-pay | Admitting: Physical Therapy

## 2021-05-03 DIAGNOSIS — M25511 Pain in right shoulder: Secondary | ICD-10-CM

## 2021-05-03 DIAGNOSIS — M6281 Muscle weakness (generalized): Secondary | ICD-10-CM

## 2021-05-03 DIAGNOSIS — R600 Localized edema: Secondary | ICD-10-CM

## 2021-05-03 DIAGNOSIS — M25611 Stiffness of right shoulder, not elsewhere classified: Secondary | ICD-10-CM

## 2021-05-03 DIAGNOSIS — R293 Abnormal posture: Secondary | ICD-10-CM

## 2021-05-03 NOTE — Therapy (Signed)
[facility]. [place], [place], [address] Phone: [phone]   Fax:  [phone]  Physical Therapy Treatment  Patient Details  Name: Marithza Malachi MRN: [id number] Date of Birth: 1950/11/22 Referring Provider (PT): Ophelia Charter   Encounter Date: 05/03/2021   PT End of Session - 05/03/21 1138     Visit Number 8    Date for PT Re-Evaluation 06/12/21    Authorization Type [facility] MCR    Authorization Time Period 04/17/21 to 06/12/21    PT Start Time 1100    PT Stop Time 1149    PT Time Calculation (min) 49 min    Activity Tolerance Patient tolerated treatment well    Behavior During Therapy [facility] for tasks assessed/performed             Past Medical History:  Diagnosis Date   Anxiety    Arthritis    Depression    Fatty liver disease, nonalcoholic    GERD (gastroesophageal reflux disease)    Hepatitis 1973   hep c-was treated   Hyperlipemia    Hypertension     Past Surgical History:  Procedure Laterality Date   BACK SURGERY     CARPAL TUNNEL RELEASE Bilateral    CHOLECYSTECTOMY     DIAGNOSTIC LAPAROSCOPY     INCONTINENCE SURGERY     LAPAROSCOPIC LYSIS OF ADHESIONS     REVERSE SHOULDER ARTHROPLASTY Right 03/20/2021   Procedure: REVERSE SHOULDER ARTHROPLASTY;  Surgeon: Hiram Gash, MD;  Location: [place];  Service: Orthopedics;  Laterality: Right;    There were no vitals filed for this visit.   Subjective Assessment - 05/03/21 1103     Subjective Doing ok, other than still trying to wake up    Currently in Pain? Yes    Pain Score 2     Pain Location Shoulder    Pain Orientation Right                               [facility] Adult PT Treatment/Exercise - 05/03/21 0001       Shoulder Exercises: Standing   External Rotation AAROM;Right;20 reps    Flexion AROM;Both;15 reps    ABduction AROM;Both;15 reps    Row Strengthening;Both;20 reps;Theraband    Theraband  Level (Shoulder Row) Level 1 (Yellow)      Shoulder Exercises: ROM/Strengthening   UBE (Upper Arm Bike) L1 x 3 min each      Vasopneumatic   Number Minutes Vasopneumatic  10 minutes    Vasopnuematic Location  Shoulder    Vasopneumatic Pressure Low    Vasopneumatic Temperature  34      Manual Therapy   Manual Therapy Soft tissue mobilization    Soft tissue mobilization R upper trap and levator, ant/middle/post deltoid, anterior bicep/proximal inner arm to tolerance    Passive ROM fle,ER, scaption and abd                       PT Short Term Goals - 05/03/21 1143       PT SHORT TERM GOAL #1   Title Will be compliant with appropriate progressive HEP    Status Partially Met      PT SHORT TERM GOAL #2   Title R shoulder PROM and AAROM to be at least 120 degrees flexion and abduction, 45 degrees ER    Status On-going  PT SHORT TERM GOAL #3   Title Pain to be no more than 4/10 at worst    Status Partially Met      PT SHORT TERM GOAL #4   Title Soft tissue restrictions in upper traps and biceps/anterior shoulder to be resolved in order to improve pain and ROM    Status On-going               PT Long Term Goals - 04/17/21 1210       PT LONG TERM GOAL #1   Title AROM R shoulder to be at least 150 degrees flexion and ABD without pain    Time 8    Period Weeks    Status New    Target Date 06/12/21      PT LONG TERM GOAL #2   Title MMT in R shoulder flexion and abduction to be at least 4/5    Time 8    Period Weeks    Status New      PT LONG TERM GOAL #3   Title Will tolerate initiation of active IR/extension ROM and strengthening R shoulder at 12 weeks as per protocol    Time 8    Period Weeks    Status New      PT LONG TERM GOAL #4   Title Will be able to dress and perform self hygiene with R UE without increased pain    Time 8    Period Weeks    Status New                   Plan - 05/03/21 1139     Clinical Impression  Statement Pt did well with a slight progression with TE. No issues reported with standing rows. Pt unable to complete RUE external rotation without postural compensation, she did well AAROM. R shoulder elevation present with abduction is the most limited with PROM.    Personal Factors and Comorbidities Past/Current Experience;Comorbidity 3+;Time since onset of injury/illness/exacerbation    Examination-Activity Limitations Bathing;Bed Mobility;Reach Overhead;Self Feeding;Caring for Others;Sleep;Carry;Dressing;Hygiene/Grooming;Lift;Toileting    Examination-Participation Restrictions Meal Prep;Cleaning;Community Activity;Driving;Interpersonal Relationship;Shop;Laundry;Yard Work;Medication Management    Stability/Clinical Decision Making Stable/Uncomplicated    Rehab Potential Good    PT Duration 8 weeks    PT Treatment/Interventions ADLs/Self Care Home Management;Biofeedback;Cryotherapy;Electrical Stimulation;Iontophoresis 4mg /ml Dexamethasone;Moist Heat;Ultrasound;Functional mobility training;Therapeutic activities;Therapeutic exercise;Patient/family education;Manual techniques;Scar mobilization;Passive range of motion;Taping;Vasopneumatic Device    PT Next Visit Plan assess how she feel,  can add more if she is able to do safely; NO IR OR EXTENSION UNTIL WEEK 12. Follow [name] protocol under media section of chart. May benefit from DN             Patient will benefit from skilled therapeutic intervention in order to improve the following deficits and impairments:  Decreased range of motion, Increased fascial restricitons, Increased muscle spasms, Impaired UE functional use, Decreased activity tolerance, Decreased knowledge of precautions, Impaired perceived functional ability, Pain, Decreased scar mobility, Hypomobility, Improper body mechanics, Decreased strength, Postural dysfunction  Visit Diagnosis: Muscle weakness (generalized)  Acute pain of right shoulder  Stiffness of right  shoulder, not elsewhere classified  Abnormal posture  Localized edema     Problem List Patient Active Problem List   Diagnosis Date Noted   De Quervain's tenosynovitis, right 07/22/2019   Abrasion of right knee 03/28/2019   Contusion of right chest wall 03/28/2019   Sprain of right wrist 03/28/2019   Bilateral carpal tunnel syndrome 03/17/2019   NAFLD (nonalcoholic fatty  liver disease) 03/15/2019   Tendinitis of left rotator cuff 01/28/2019   Capsulitis of right shoulder 01/28/2019   Chronic right shoulder pain 01/21/2019   History of hepatitis C 01/21/2019   Joint pain in both hands 01/18/2019   Need for influenza vaccination 01/18/2019   Depression with anxiety 09/15/2018   Seasonal allergic rhinitis due to pollen 03/16/2018   B12 deficiency 07/16/2017   Neuropathy 07/15/2017   Spinal stenosis of lumbar region with neurogenic claudication 07/15/2017   Elevated cholesterol 07/15/2017   Healthcare maintenance 07/15/2017   Essential hypertension 07/15/2017   Abnormal CBC 07/15/2017   Monoclonal paraproteinemia 03/23/2012    Scot Jun, PTA 05/03/2021, 11:44 AM  [facility]. [place], [place], [address] Phone: [phone]   Fax:  [phone]  Name: Leilene Diprima MRN: [id number] Date of Birth: 11-30-1950

## 2021-05-06 ENCOUNTER — Ambulatory Visit: Payer: Medicare Other | Admitting: Physical Therapy

## 2021-05-08 ENCOUNTER — Ambulatory Visit: Payer: Medicare Other | Admitting: Physical Therapy

## 2021-05-08 ENCOUNTER — Other Ambulatory Visit: Payer: Self-pay

## 2021-05-08 ENCOUNTER — Encounter: Payer: Self-pay | Admitting: Physical Therapy

## 2021-05-08 DIAGNOSIS — M6281 Muscle weakness (generalized): Secondary | ICD-10-CM

## 2021-05-08 DIAGNOSIS — M25511 Pain in right shoulder: Secondary | ICD-10-CM | POA: Diagnosis not present

## 2021-05-08 DIAGNOSIS — R293 Abnormal posture: Secondary | ICD-10-CM

## 2021-05-08 DIAGNOSIS — M25611 Stiffness of right shoulder, not elsewhere classified: Secondary | ICD-10-CM

## 2021-05-08 DIAGNOSIS — R600 Localized edema: Secondary | ICD-10-CM

## 2021-05-08 NOTE — Therapy (Signed)
[place]. [place], [place], [address] Phone: [phone]   Fax:  [phone]  Physical Therapy Treatment  Patient Details  Name: Michele Hanson MRN: [id number] Date of Birth: 12-22-1950 Referring Provider (PT): Ophelia Charter   Encounter Date: 05/08/2021   PT End of Session - 05/08/21 1530     Visit Number 9    Number of Visits 21    Date for PT Re-Evaluation 06/12/21    Authorization Type [facility] MCR    Authorization Time Period 04/17/21 to 06/12/21    Progress Note Due on Visit 10    PT Start Time 8421    PT Stop Time 1527    PT Time Calculation (min) 42 min    Activity Tolerance Patient tolerated treatment well    Behavior During Therapy [facility] for tasks assessed/performed             Past Medical History:  Diagnosis Date   Anxiety    Arthritis    Depression    Fatty liver disease, nonalcoholic    GERD (gastroesophageal reflux disease)    Hepatitis 1973   hep c-was treated   Hyperlipemia    Hypertension     Past Surgical History:  Procedure Laterality Date   BACK SURGERY     CARPAL TUNNEL RELEASE Bilateral    CHOLECYSTECTOMY     DIAGNOSTIC LAPAROSCOPY     INCONTINENCE SURGERY     LAPAROSCOPIC LYSIS OF ADHESIONS     REVERSE SHOULDER ARTHROPLASTY Right 03/20/2021   Procedure: REVERSE SHOULDER ARTHROPLASTY;  Surgeon: Hiram Gash, MD;  Location: [place];  Service: Orthopedics;  Laterality: Right;    There were no vitals filed for this visit.   Subjective Assessment - 05/08/21 1446     Subjective My arm is actually feeling really good today, almost best its felt; overall it's stayed pretty throbby and aching though. I've been using the ice machine from surgery at home.    Patient Stated Goals would like to get arm back to normal- as much as I can after the procedure    Currently in Pain? Yes    Pain Score 1     Pain Location Shoulder    Pain Orientation Right    Pain  Descriptors / Indicators Aching;Throbbing                               [facility] Adult PT Treatment/Exercise - 05/08/21 0001       Shoulder Exercises: Supine   Flexion Right;PROM;10 reps    Flexion Limitations approximately 120-130 degrees before compensations; also supine AROM to tolerance (concentric only per protocol, PT assisted with return to starting position as PROM)    ABduction PROM;Right;10 reps    ABduction Limitations approximately 100-110 degrees without compensations    Other Supine Exercises AROM shoulder scaption concentrically as per protocol (PT managed return to resting/avoided eccentric motion      Manual Therapy   Manual Therapy Soft tissue mobilization    Manual therapy comments gentle GH distraction and oscillation    Soft tissue mobilization R biceps tendon area, anterior and medial upper arm                     PT Education - 05/08/21 1529     Education Details exercise from/progressions per protocol; benefit of biofreeze and precautions/wash off if any skin irritation and before she  applies cold pack    Person(s) Educated Patient    Methods Explanation    Comprehension Verbalized understanding              PT Short Term Goals - 05/03/21 1143       PT SHORT TERM GOAL #1   Title Will be compliant with appropriate progressive HEP    Status Partially Met      PT SHORT TERM GOAL #2   Title R shoulder PROM and AAROM to be at least 120 degrees flexion and abduction, 45 degrees ER    Status On-going      PT SHORT TERM GOAL #3   Title Pain to be no more than 4/10 at worst    Status Partially Met      PT SHORT TERM GOAL #4   Title Soft tissue restrictions in upper traps and biceps/anterior shoulder to be resolved in order to improve pain and ROM    Status On-going               PT Long Term Goals - 04/17/21 1210       PT LONG TERM GOAL #1   Title AROM R shoulder to be at least 150 degrees flexion and ABD  without pain    Time 8    Period Weeks    Status New    Target Date 06/12/21      PT LONG TERM GOAL #2   Title MMT in R shoulder flexion and abduction to be at least 4/5    Time 8    Period Weeks    Status New      PT LONG TERM GOAL #3   Title Will tolerate initiation of active IR/extension ROM and strengthening R shoulder at 12 weeks as per protocol    Time 8    Period Weeks    Status New      PT LONG TERM GOAL #4   Title Will be able to dress and perform self hygiene with R UE without increased pain    Time 8    Period Weeks    Status New                   Plan - 05/08/21 1530     Clinical Impression Statement Michele Hanson arrives doing well today, her shoulder is actually feeling quite good today even with the rain. We continued working on ROM and shoulder stretching today, she had more pain than normal in anterior and medial bicep and actually some significant soft tissue restrictions here so spent more time than initially planned trying to work these out. Otherwise progressed to supine flexion AROM and scaption concentrically within protocol; did attempt in sitting but she had too many compensations. ROM coming along nicely in general. We also tried some biofreeze today, educated on precautions. Will need tenth visit progress note next session as well.    Personal Factors and Comorbidities Past/Current Experience;Comorbidity 3+;Time since onset of injury/illness/exacerbation    Examination-Activity Limitations Bathing;Bed Mobility;Reach Overhead;Self Feeding;Caring for Others;Sleep;Carry;Dressing;Hygiene/Grooming;Lift;Toileting    Examination-Participation Restrictions Meal Prep;Cleaning;Community Activity;Driving;Interpersonal Relationship;Shop;Laundry;Yard Work;Medication Management    Stability/Clinical Decision Making Stable/Uncomplicated    Clinical Decision Making Low    Rehab Potential Good    PT Frequency Other (comment)    PT Duration 8 weeks    PT  Treatment/Interventions ADLs/Self Care Home Management;Biofeedback;Cryotherapy;Electrical Stimulation;Iontophoresis 50m/ml Dexamethasone;Moist Heat;Ultrasound;Functional mobility training;Therapeutic activities;Therapeutic exercise;Patient/family education;Manual techniques;Scar mobilization;Passive range of motion;Taping;Vasopneumatic Device    PT Next Visit Plan  assess how she feel,  can add more if she is able to do safely; NO IR OR EXTENSION UNTIL WEEK 12. Follow [name] protocol under media section of chart. May benefit from DN    PT [place] and Agree with Plan of Care Patient             Patient will benefit from skilled therapeutic intervention in order to improve the following deficits and impairments:  Decreased range of motion, Increased fascial restricitons, Increased muscle spasms, Impaired UE functional use, Decreased activity tolerance, Decreased knowledge of precautions, Impaired perceived functional ability, Pain, Decreased scar mobility, Hypomobility, Improper body mechanics, Decreased strength, Postural dysfunction  Visit Diagnosis: Muscle weakness (generalized)  Acute pain of right shoulder  Stiffness of right shoulder, not elsewhere classified  Abnormal posture  Localized edema     Problem List Patient Active Problem List   Diagnosis Date Noted   De Quervain's tenosynovitis, right 07/22/2019   Abrasion of right knee 03/28/2019   Contusion of right chest wall 03/28/2019   Sprain of right wrist 03/28/2019   Bilateral carpal tunnel syndrome 03/17/2019   NAFLD (nonalcoholic fatty liver disease) 03/15/2019   Tendinitis of left rotator cuff 01/28/2019   Capsulitis of right shoulder 01/28/2019   Chronic right shoulder pain 01/21/2019   History of hepatitis C 01/21/2019   Joint pain in both hands 01/18/2019   Need for influenza vaccination 01/18/2019   Depression with anxiety 09/15/2018   Seasonal allergic rhinitis due to  pollen 03/16/2018   B12 deficiency 07/16/2017   Neuropathy 07/15/2017   Spinal stenosis of lumbar region with neurogenic claudication 07/15/2017   Elevated cholesterol 07/15/2017   Healthcare maintenance 07/15/2017   Essential hypertension 07/15/2017   Abnormal CBC 07/15/2017   Monoclonal paraproteinemia 03/23/2012   Ann Lions PT, DPT, PN2   Supplemental Physical Therapist [facility]    Pager [phone] [facility]. [place], [place], [address] Phone: [phone]   Fax:  [phone]  Name: Michele Hanson MRN: [id number] Date of Birth: [birth year]

## 2021-05-10 ENCOUNTER — Other Ambulatory Visit: Payer: Self-pay

## 2021-05-10 ENCOUNTER — Encounter: Payer: Self-pay | Admitting: Physical Therapy

## 2021-05-10 ENCOUNTER — Ambulatory Visit: Payer: Medicare Other | Admitting: Physical Therapy

## 2021-05-10 DIAGNOSIS — M25511 Pain in right shoulder: Secondary | ICD-10-CM

## 2021-05-10 DIAGNOSIS — M6281 Muscle weakness (generalized): Secondary | ICD-10-CM

## 2021-05-10 DIAGNOSIS — M25611 Stiffness of right shoulder, not elsewhere classified: Secondary | ICD-10-CM

## 2021-05-10 DIAGNOSIS — R600 Localized edema: Secondary | ICD-10-CM

## 2021-05-10 DIAGNOSIS — R293 Abnormal posture: Secondary | ICD-10-CM

## 2021-05-10 NOTE — Therapy (Signed)
[place]. [place], [place], [address] Phone: [phone]   Fax:  [phone] Progress Note Reporting Period 04/17/21 to 05/10/21  See note below for Objective Data and Assessment of Progress/Goals.     Physical Therapy Treatment  Patient Details  Name: Michele Hanson MRN: [id number] Date of Birth: Apr 13, 1951 Referring Provider (PT): Ophelia Charter   Encounter Date: 05/10/2021   PT End of Session - 05/10/21 1139     Visit Number 10    Number of Visits 21    Date for PT Re-Evaluation 06/12/21    PT Start Time 1100    PT Stop Time 1145    PT Time Calculation (min) 45 min             Past Medical History:  Diagnosis Date   Anxiety    Arthritis    Depression    Fatty liver disease, nonalcoholic    GERD (gastroesophageal reflux disease)    Hepatitis 1973   hep c-was treated   Hyperlipemia    Hypertension     Past Surgical History:  Procedure Laterality Date   BACK SURGERY     CARPAL TUNNEL RELEASE Bilateral    CHOLECYSTECTOMY     DIAGNOSTIC LAPAROSCOPY     INCONTINENCE SURGERY     LAPAROSCOPIC LYSIS OF ADHESIONS     REVERSE SHOULDER ARTHROPLASTY Right 03/20/2021   Procedure: REVERSE SHOULDER ARTHROPLASTY;  Surgeon: Hiram Gash, MD;  Location: [place];  Service: Orthopedics;  Laterality: Right;    There were no vitals filed for this visit.   Subjective Assessment - 05/10/21 1103     Subjective no in a great mood just found out her dog has cancer. Shoulder is ok    Currently in Pain? Yes    Pain Score 1                 [facility] PT Assessment - 05/10/21 0001       AROM   Right Shoulder Flexion 115 Degrees    Right Shoulder ABduction 71 Degrees                           [facility] Adult PT Treatment/Exercise - 05/10/21 0001       Shoulder Exercises: Standing   Flexion AROM;Both;15 reps    ABduction AROM;Both;15 reps    Row Strengthening;Both;20  reps;Theraband    Theraband Level (Shoulder Row) Level 1 (Yellow)    Other Standing Exercises RUE 2 level cabinet reaches  1lb x15, 1 level abduction 1lb x15      Shoulder Exercises: ROM/Strengthening   UBE (Upper Arm Bike) L1 x 3 min each      Vasopneumatic   Number Minutes Vasopneumatic  10 minutes    Vasopnuematic Location  Shoulder    Vasopneumatic Pressure Low    Vasopneumatic Temperature  34      Manual Therapy   Manual Therapy Soft tissue mobilization    Manual therapy comments gentle GH distraction and oscillation    Passive ROM fle,ER, scaption and abd                       PT Short Term Goals - 05/10/21 1139       PT SHORT TERM GOAL #1   Title Will be compliant with appropriate progressive HEP    Status Achieved      PT SHORT TERM GOAL #2  Title R shoulder PROM and AAROM to be at least 120 degrees flexion and abduction, 45 degrees ER    Status On-going               PT Long Term Goals - 05/10/21 1139       PT LONG TERM GOAL #1   Title AROM R shoulder to be at least 150 degrees flexion and ABD without pain    Status On-going      PT LONG TERM GOAL #2   Title MMT in R shoulder flexion and abduction to be at least 4/5    Status On-going                   Plan - 05/10/21 1141     Clinical Impression Statement Pt did well with a more active treatment. Some aching reported after aerobic warm up. Tactile cue to squeeze shoulder blades together with standing rows  At empted R shoulder active ER but unable to complete without excessive compensation. Cabinet reaches with light resistance was difficult for patient. Some R shoulder motions limited by protocol.    Personal Factors and Comorbidities Past/Current Experience;Comorbidity 3+;Time since onset of injury/illness/exacerbation    Examination-Activity Limitations Bathing;Bed Mobility;Reach Overhead;Self Feeding;Caring for Others;Sleep;Carry;Dressing;Hygiene/Grooming;Lift;Toileting     Examination-Participation Restrictions Meal Prep;Cleaning;Community Activity;Driving;Interpersonal Relationship;Shop;Laundry;Yard Work;Medication Management    Stability/Clinical Decision Making Stable/Uncomplicated    Rehab Potential Good    PT Treatment/Interventions ADLs/Self Care Home Management;Biofeedback;Cryotherapy;Electrical Stimulation;Iontophoresis 4mg /ml Dexamethasone;Moist Heat;Ultrasound;Functional mobility training;Therapeutic activities;Therapeutic exercise;Patient/family education;Manual techniques;Scar mobilization;Passive range of motion;Taping;Vasopneumatic Device    PT Next Visit Plan assess how she feel,  can add more if she is able to do safely; NO IR OR EXTENSION UNTIL WEEK 12. Follow [name] protocol under media section of chart. May benefit from DN             Patient will benefit from skilled therapeutic intervention in order to improve the following deficits and impairments:  Decreased range of motion, Increased fascial restricitons, Increased muscle spasms, Impaired UE functional use, Decreased activity tolerance, Decreased knowledge of precautions, Impaired perceived functional ability, Pain, Decreased scar mobility, Hypomobility, Improper body mechanics, Decreased strength, Postural dysfunction  Visit Diagnosis: Muscle weakness (generalized)  Stiffness of right shoulder, not elsewhere classified  Localized edema  Acute pain of right shoulder  Abnormal posture     Problem List Patient Active Problem List   Diagnosis Date Noted   De Quervain's tenosynovitis, right 07/22/2019   Abrasion of right knee 03/28/2019   Contusion of right chest wall 03/28/2019   Sprain of right wrist 03/28/2019   Bilateral carpal tunnel syndrome 03/17/2019   NAFLD (nonalcoholic fatty liver disease) 03/15/2019   Tendinitis of left rotator cuff 01/28/2019   Capsulitis of right shoulder 01/28/2019   Chronic right shoulder pain 01/21/2019   History of hepatitis C  01/21/2019   Joint pain in both hands 01/18/2019   Need for influenza vaccination 01/18/2019   Depression with anxiety 09/15/2018   Seasonal allergic rhinitis due to pollen 03/16/2018   B12 deficiency 07/16/2017   Neuropathy 07/15/2017   Spinal stenosis of lumbar region with neurogenic claudication 07/15/2017   Elevated cholesterol 07/15/2017   Healthcare maintenance 07/15/2017   Essential hypertension 07/15/2017   Abnormal CBC 07/15/2017   Monoclonal paraproteinemia 03/23/2012    Scot Jun, PTA 05/10/2021, 11:45 AM  [place]. [place], [place], [address] Phone: [phone]   Fax:  [phone]  Name: Michele Hanson  MRN: [id number] Date of Birth: [birth year]

## 2021-05-14 ENCOUNTER — Ambulatory Visit: Payer: Medicare Other | Admitting: Physical Therapy

## 2021-05-14 ENCOUNTER — Encounter: Payer: Self-pay | Admitting: Physical Therapy

## 2021-05-14 ENCOUNTER — Other Ambulatory Visit: Payer: Self-pay

## 2021-05-14 DIAGNOSIS — M25511 Pain in right shoulder: Secondary | ICD-10-CM

## 2021-05-14 DIAGNOSIS — R600 Localized edema: Secondary | ICD-10-CM

## 2021-05-14 DIAGNOSIS — R293 Abnormal posture: Secondary | ICD-10-CM

## 2021-05-14 DIAGNOSIS — M6281 Muscle weakness (generalized): Secondary | ICD-10-CM

## 2021-05-14 DIAGNOSIS — M25611 Stiffness of right shoulder, not elsewhere classified: Secondary | ICD-10-CM

## 2021-05-14 NOTE — Therapy (Signed)
[place]. [place], [place], [address] Phone: [phone]   Fax:  [phone]  Physical Therapy Treatment  Patient Details  Name: Michele Hanson MRN: [id number] Date of Birth: February 20, 1951 Referring Provider (PT): Ophelia Charter   Encounter Date: 05/14/2021   PT End of Session - 05/14/21 1433     Visit Number 11    Number of Visits 21    Date for PT Re-Evaluation 06/12/21    Authorization Type [facility] MCR    Authorization Time Period 04/17/21 to 06/12/21    Progress Note Due on Visit 20    PT Start Time 1318    PT Stop Time 1357    PT Time Calculation (min) 39 min    Activity Tolerance Patient tolerated treatment well    Behavior During Therapy [facility] for tasks assessed/performed             Past Medical History:  Diagnosis Date   Anxiety    Arthritis    Depression    Fatty liver disease, nonalcoholic    GERD (gastroesophageal reflux disease)    Hepatitis 1973   hep c-was treated   Hyperlipemia    Hypertension     Past Surgical History:  Procedure Laterality Date   BACK SURGERY     CARPAL TUNNEL RELEASE Bilateral    CHOLECYSTECTOMY     DIAGNOSTIC LAPAROSCOPY     INCONTINENCE SURGERY     LAPAROSCOPIC LYSIS OF ADHESIONS     REVERSE SHOULDER ARTHROPLASTY Right 03/20/2021   Procedure: REVERSE SHOULDER ARTHROPLASTY;  Surgeon: Hiram Gash, MD;  Location: [place];  Service: Orthopedics;  Laterality: Right;    There were no vitals filed for this visit.   Subjective Assessment - 05/14/21 1320     Subjective I'm hurting today, shoulder is aching with the rain like a toothache; still upset about one dog having metastatic CA.    Patient Stated Goals would like to get arm back to normal- as much as I can after the procedure    Currently in Pain? Yes    Pain Score 2     Pain Location Shoulder    Pain Orientation Right    Pain Descriptors / Indicators Aching;Throbbing                                [facility] Adult PT Treatment/Exercise - 05/14/21 0001       Shoulder Exercises: Supine   Flexion Right;12 reps;AROM    Flexion Limitations 170 degrees with gentle distraction/oscillation with PROM return to starting    ABduction AROM;Right;12 reps    ABduction Limitations approximately 140 degrees with oscillation/distraction and joint mobs between reps      Manual Therapy   Manual Therapy Joint mobilization;Soft tissue mobilization    Manual therapy comments gentle GH distraction and oscillation    Joint Mobilization GH mobs grade I-II    Soft tissue mobilization R deltoid and UT             ER AROM approximately 30 degrees in supine with Mod facilitation, ER PROM in supine approximately 45 degrees at 10 degrees ABD         PT Education - 05/14/21 1432     Education Details exercise form/ROM gains today, benefits of DN    Person(s) Educated Patient    Methods Explanation    Comprehension Verbalized understanding  PT Short Term Goals - 05/10/21 1139       PT SHORT TERM GOAL #1   Title Will be compliant with appropriate progressive HEP    Status Achieved      PT SHORT TERM GOAL #2   Title R shoulder PROM and AAROM to be at least 120 degrees flexion and abduction, 45 degrees ER    Status On-going               PT Long Term Goals - 05/10/21 1139       PT LONG TERM GOAL #1   Title AROM R shoulder to be at least 150 degrees flexion and ABD without pain    Status On-going      PT LONG TERM GOAL #2   Title MMT in R shoulder flexion and abduction to be at least 4/5    Status On-going                   Plan - 05/14/21 1433     Clinical Impression Statement Michele Hanson arrives having some aching pain today, maybe due to the rain. Able to progress AROM in flexion and ABD in supine, alternated active reps with passive joint oscillation/distraction and grade II GH mobs today. Tolerated well and able to  increase supine ABD ROM by 10-15 degrees. Did some active ER in supine but needed mod facilitation for good form, also worked on ER PROM too. Finished with STM to R deltoid and inner bicep groups. May benefit from DN to deltoids, UT, and biceps specifically.  Progressing generally as expected.    Personal Factors and Comorbidities Past/Current Experience;Comorbidity 3+;Time since onset of injury/illness/exacerbation    Examination-Activity Limitations Bathing;Bed Mobility;Reach Overhead;Self Feeding;Caring for Others;Sleep;Carry;Dressing;Hygiene/Grooming;Lift;Toileting    Examination-Participation Restrictions Meal Prep;Cleaning;Community Activity;Driving;Interpersonal Relationship;Shop;Laundry;Yard Work;Medication Management    Stability/Clinical Decision Making Stable/Uncomplicated    Clinical Decision Making Low    Rehab Potential Good    PT Frequency Other (comment)    PT Duration 8 weeks    PT Treatment/Interventions ADLs/Self Care Home Management;Biofeedback;Cryotherapy;Electrical Stimulation;Iontophoresis 4mg /ml Dexamethasone;Moist Heat;Ultrasound;Functional mobility training;Therapeutic activities;Therapeutic exercise;Patient/family education;Manual techniques;Scar mobilization;Passive range of motion;Taping;Vasopneumatic Device    PT Next Visit Plan DN in delts and upper trap, medial bicep .assess how she feel,  can add more if she is able to do safely; NO IR OR EXTENSION UNTIL WEEK 12. Follow [name] protocol under media section of chart.    PT Home Exercise Plan [id number]    Consulted and Agree with Plan of Care Patient             Patient will benefit from skilled therapeutic intervention in order to improve the following deficits and impairments:  Decreased range of motion, Increased fascial restricitons, Increased muscle spasms, Impaired UE functional use, Decreased activity tolerance, Decreased knowledge of precautions, Impaired perceived functional ability, Pain, Decreased  scar mobility, Hypomobility, Improper body mechanics, Decreased strength, Postural dysfunction  Visit Diagnosis: Muscle weakness (generalized)  Stiffness of right shoulder, not elsewhere classified  Localized edema  Acute pain of right shoulder  Abnormal posture     Problem List Patient Active Problem List   Diagnosis Date Noted   De Quervain's tenosynovitis, right 07/22/2019   Abrasion of right knee 03/28/2019   Contusion of right chest wall 03/28/2019   Sprain of right wrist 03/28/2019   Bilateral carpal tunnel syndrome 03/17/2019   NAFLD (nonalcoholic fatty liver disease) 03/15/2019   Tendinitis of left rotator cuff 01/28/2019   Capsulitis of right shoulder 01/28/2019  Chronic right shoulder pain 01/21/2019   History of hepatitis C 01/21/2019   Joint pain in both hands 01/18/2019   Need for influenza vaccination 01/18/2019   Depression with anxiety 09/15/2018   Seasonal allergic rhinitis due to pollen 03/16/2018   B12 deficiency 07/16/2017   Neuropathy 07/15/2017   Spinal stenosis of lumbar region with neurogenic claudication 07/15/2017   Elevated cholesterol 07/15/2017   Healthcare maintenance 07/15/2017   Essential hypertension 07/15/2017   Abnormal CBC 07/15/2017   Monoclonal paraproteinemia 03/23/2012   Ann Lions PT, DPT, PN2   Supplemental Physical Therapist [place]. [place], [place], [address] Phone: [phone]   Fax:  [phone]  Name: Kimbely Whiteaker MRN: [id number] Date of Birth: 07-Jul-1950

## 2021-05-16 ENCOUNTER — Encounter: Payer: Self-pay | Admitting: Physical Therapy

## 2021-05-16 ENCOUNTER — Other Ambulatory Visit: Payer: Self-pay

## 2021-05-16 ENCOUNTER — Ambulatory Visit: Payer: Medicare Other | Attending: Orthopaedic Surgery | Admitting: Physical Therapy

## 2021-05-16 DIAGNOSIS — M25511 Pain in right shoulder: Secondary | ICD-10-CM | POA: Insufficient documentation

## 2021-05-16 DIAGNOSIS — M6281 Muscle weakness (generalized): Secondary | ICD-10-CM | POA: Diagnosis not present

## 2021-05-16 DIAGNOSIS — M25611 Stiffness of right shoulder, not elsewhere classified: Secondary | ICD-10-CM | POA: Diagnosis present

## 2021-05-16 DIAGNOSIS — R600 Localized edema: Secondary | ICD-10-CM | POA: Insufficient documentation

## 2021-05-16 DIAGNOSIS — R293 Abnormal posture: Secondary | ICD-10-CM | POA: Diagnosis present

## 2021-05-16 NOTE — Therapy (Signed)
[place]. [place], [place], [address] Phone: [phone]   Fax:  [phone]  Physical Therapy Treatment  Patient Details  Name: Michele Hanson MRN: [id number] Date of Birth: 03/20/1951 Referring Provider (PT): Ophelia Charter   Encounter Date: 05/16/2021   PT End of Session - 05/16/21 1630     Visit Number 12    Number of Visits 21    Date for PT Re-Evaluation 06/12/21    Authorization Type [facility]    Authorization Time Period 04/17/21 to 06/12/21    Progress Note Due on Visit 26    PT Start Time 1322   DN not included in billing   PT Stop Time 1400    PT Time Calculation (min) 38 min    Activity Tolerance Patient tolerated treatment well    Behavior During Therapy [facility] for tasks assessed/performed             Past Medical History:  Diagnosis Date   Anxiety    Arthritis    Depression    Fatty liver disease, nonalcoholic    GERD (gastroesophageal reflux disease)    Hepatitis 1973   hep c-was treated   Hyperlipemia    Hypertension     Past Surgical History:  Procedure Laterality Date   BACK SURGERY     CARPAL TUNNEL RELEASE Bilateral    CHOLECYSTECTOMY     DIAGNOSTIC LAPAROSCOPY     INCONTINENCE SURGERY     LAPAROSCOPIC LYSIS OF ADHESIONS     REVERSE SHOULDER ARTHROPLASTY Right 03/20/2021   Procedure: REVERSE SHOULDER ARTHROPLASTY;  Surgeon: Hiram Gash, MD;  Location: [place];  Service: Orthopedics;  Laterality: Right;    There were no vitals filed for this visit.   Subjective Assessment - 05/16/21 1318     Subjective Felt good after last time, nothing new going on. My shoulder was sore after the manual last time, I'm still interested in trying DN. Biofreeze does help still.    Patient Stated Goals would like to get arm back to normal- as much as I can after the procedure    Currently in Pain? Yes    Pain Score 1     Pain Location Shoulder    Pain Orientation Right     Pain Descriptors / Indicators Aching;Throbbing    Pain Type Acute pain                               [facility] Adult PT Treatment/Exercise - 05/16/21 0001       Shoulder Exercises: Supine   Flexion Right;15 reps    Theraband Level (Shoulder Flexion) Level 1 (Yellow)    Flexion Limitations 5 concentrically with yellow TB (passive return to start), x10 AROM no resistance with passive lower/GH mobs and oscillation/distraction    ABduction AROM;10 reps    ABduction Limitations approximately 140 degrees with oscillation/distraction and joint mobs between reps      Manual Therapy   Manual Therapy Joint mobilization;Soft tissue mobilization    Manual therapy comments gentle GH distraction and oscillation    Joint Mobilization GH mobs grade I-II    Soft tissue mobilization R deltoid and UT              Trigger Point Dry Needling - 05/16/21 0001     Consent Given? Yes    Education Handout Provided Yes    Muscles Treated Head and  Neck Upper trapezius    Muscles Treated Upper Quadrant Deltoid    Upper Trapezius Response Twitch reponse elicited    Deltoid Response Palpable increased muscle length                   PT Education - 05/16/21 1629     Education Details general trigger point DN, benefits; HEP updates; general education on where we are on PT protocol and course of progression moving forward    Person(s) Educated Patient    Methods Explanation    Comprehension Verbalized understanding              PT Short Term Goals - 05/10/21 1139       PT SHORT TERM GOAL #1   Title Will be compliant with appropriate progressive HEP    Status Achieved      PT SHORT TERM GOAL #2   Title R shoulder PROM and AAROM to be at least 120 degrees flexion and abduction, 45 degrees ER    Status On-going               PT Long Term Goals - 05/10/21 1139       PT LONG TERM GOAL #1   Title AROM R shoulder to be at least 150 degrees flexion and ABD  without pain    Status On-going      PT LONG TERM GOAL #2   Title MMT in R shoulder flexion and abduction to be at least 4/5    Status On-going                   Plan - 05/16/21 1633     Clinical Impression Statement Michele Hanson arrives today feeling very well since last session, still interested in trying DN. Had certified clinician DN, then f/u immediately with STM to these areas to help reduce mm soreness after needling, otherwise introduced gentle concentric resisted flexion with yellow TB and progressed supine AROM otherwise. I think she is OK now to work on passive shoulder flexion and abduction stretches at home, does tend to compensate quite a bit without realizing it so we are being a bit selective in what/how much we give her to work on at home. Will continue to progress as able per protocol.    Personal Factors and Comorbidities Past/Current Experience;Comorbidity 3+;Time since onset of injury/illness/exacerbation    Examination-Activity Limitations Bathing;Bed Mobility;Reach Overhead;Self Feeding;Caring for Others;Sleep;Carry;Dressing;Hygiene/Grooming;Lift;Toileting    Examination-Participation Restrictions Meal Prep;Cleaning;Community Activity;Driving;Interpersonal Relationship;Shop;Laundry;Yard Work;Medication Management    Stability/Clinical Decision Making Stable/Uncomplicated    Clinical Decision Making Low    Rehab Potential Good    PT Frequency Other (comment)    PT Duration 8 weeks    PT Treatment/Interventions ADLs/Self Care Home Management;Biofeedback;Cryotherapy;Electrical Stimulation;Iontophoresis 4mg /ml Dexamethasone;Moist Heat;Ultrasound;Functional mobility training;Therapeutic activities;Therapeutic exercise;Patient/family education;Manual techniques;Scar mobilization;Passive range of motion;Taping;Vasopneumatic Device    PT Next Visit Plan how does she feel after DN, do we need to do it again? progress per protocol otherwise    PT Home Exercise Plan [id number]     Consulted and Agree with Plan of Care Patient             Patient will benefit from skilled therapeutic intervention in order to improve the following deficits and impairments:  Decreased range of motion, Increased fascial restricitons, Increased muscle spasms, Impaired UE functional use, Decreased activity tolerance, Decreased knowledge of precautions, Impaired perceived functional ability, Pain, Decreased scar mobility, Hypomobility, Improper body mechanics, Decreased strength, Postural dysfunction  Visit Diagnosis: Muscle  weakness (generalized)  Stiffness of right shoulder, not elsewhere classified  Localized edema  Acute pain of right shoulder  Abnormal posture     Problem List Patient Active Problem List   Diagnosis Date Noted   De Quervain's tenosynovitis, right 07/22/2019   Abrasion of right knee 03/28/2019   Contusion of right chest wall 03/28/2019   Sprain of right wrist 03/28/2019   Bilateral carpal tunnel syndrome 03/17/2019   NAFLD (nonalcoholic fatty liver disease) 03/15/2019   Tendinitis of left rotator cuff 01/28/2019   Capsulitis of right shoulder 01/28/2019   Chronic right shoulder pain 01/21/2019   History of hepatitis C 01/21/2019   Joint pain in both hands 01/18/2019   Need for influenza vaccination 01/18/2019   Depression with anxiety 09/15/2018   Seasonal allergic rhinitis due to pollen 03/16/2018   B12 deficiency 07/16/2017   Neuropathy 07/15/2017   Spinal stenosis of lumbar region with neurogenic claudication 07/15/2017   Elevated cholesterol 07/15/2017   Healthcare maintenance 07/15/2017   Essential hypertension 07/15/2017   Abnormal CBC 07/15/2017   Monoclonal paraproteinemia 03/23/2012   Ann Lions PT, DPT, PN2   Supplemental Physical Therapist [place]. [place], [place], [address] Phone: [phone]   Fax:  [phone]  Name: Michele Hanson MRN: [id number] Date of Birth: 04-12-1951

## 2021-05-16 NOTE — Patient Instructions (Signed)

## 2021-05-21 ENCOUNTER — Ambulatory Visit: Payer: Medicare Other | Admitting: Physical Therapy

## 2021-05-21 ENCOUNTER — Other Ambulatory Visit: Payer: Self-pay

## 2021-05-21 ENCOUNTER — Encounter: Payer: Self-pay | Admitting: Physical Therapy

## 2021-05-21 DIAGNOSIS — M6281 Muscle weakness (generalized): Secondary | ICD-10-CM

## 2021-05-21 DIAGNOSIS — R600 Localized edema: Secondary | ICD-10-CM

## 2021-05-21 DIAGNOSIS — R293 Abnormal posture: Secondary | ICD-10-CM

## 2021-05-21 DIAGNOSIS — M25611 Stiffness of right shoulder, not elsewhere classified: Secondary | ICD-10-CM

## 2021-05-21 DIAGNOSIS — M25511 Pain in right shoulder: Secondary | ICD-10-CM

## 2021-05-21 NOTE — Therapy (Signed)
[place]. [place], [place], [address] Phone: [phone]   Fax:  [phone]  Physical Therapy Treatment  Patient Details  Name: Michele Hanson MRN: [id number] Date of Birth: 1950-07-13 Referring Provider (PT): Ophelia Charter   Encounter Date: 05/21/2021   PT End of Session - 05/21/21 1426     Visit Number 13    Number of Visits 21    Date for PT Re-Evaluation 06/12/21    Authorization Type [facility]    Authorization Time Period 04/17/21 to 06/12/21    Progress Note Due on Visit 98    PT Start Time 1322   DN not included in billing   PT Stop Time 1400    PT Time Calculation (min) 38 min    Activity Tolerance Patient tolerated treatment well    Behavior During Therapy [facility] for tasks assessed/performed             Past Medical History:  Diagnosis Date   Anxiety    Arthritis    Depression    Fatty liver disease, nonalcoholic    GERD (gastroesophageal reflux disease)    Hepatitis 1973   hep c-was treated   Hyperlipemia    Hypertension     Past Surgical History:  Procedure Laterality Date   BACK SURGERY     CARPAL TUNNEL RELEASE Bilateral    CHOLECYSTECTOMY     DIAGNOSTIC LAPAROSCOPY     INCONTINENCE SURGERY     LAPAROSCOPIC LYSIS OF ADHESIONS     REVERSE SHOULDER ARTHROPLASTY Right 03/20/2021   Procedure: REVERSE SHOULDER ARTHROPLASTY;  Surgeon: Hiram Gash, MD;  Location: [place];  Service: Orthopedics;  Laterality: Right;    There were no vitals filed for this visit.   Subjective Assessment - 05/21/21 1318     Subjective I'm really feeling down today, I had to put my dog down yesterday afternoon. I feel like my shoulder is actually pretty good, I think its coming along better, I'm able to raise my arms better.    Patient Stated Goals would like to get arm back to normal- as much as I can after the procedure    Pain Score 0-No pain                                [facility] Adult PT Treatment/Exercise - 05/21/21 0001       Shoulder Exercises: Supine   Flexion Strengthening;Right;10 reps    Theraband Level (Shoulder Flexion) Level 1 (Yellow)    Flexion Limitations concentric motion only    ABduction Strengthening;10 reps    Theraband Level (Shoulder ABduction) Level 1 (Yellow)    ABduction Limitations multimodal cues for good form and to limit to concentric motions only    Other Supine Exercises AROM shoulder flexion      Shoulder Exercises: Sidelying   Other Sidelying Exercises ER with sheet roll tucked under elbow 1x10   in avilable ROM (10 degrees)   Other Sidelying Exercises ER PROM/stretching 45 degrees at 20 degrees abd, progressed to 20-30 degrees at 60 degrees ABD      Manual Therapy   Manual Therapy Soft tissue mobilization;Joint mobilization    Manual therapy comments gentle GH distraction and oscillation    Joint Mobilization GH grade I-II    Soft tissue mobilization R deltoid and UT              Trigger  Point Dry Needling - 05/21/21 0001     Consent Given? Yes    Upper Trapezius Response Twitch reponse elicited    Deltoid Response Palpable increased muscle length                   PT Education - 05/21/21 1425     Education Details reviewed precautions/protocol, benefits of DN/STM    Person(s) Educated Patient    Methods Explanation    Comprehension Verbalized understanding              PT Short Term Goals - 05/10/21 1139       PT SHORT TERM GOAL #1   Title Will be compliant with appropriate progressive HEP    Status Achieved      PT SHORT TERM GOAL #2   Title R shoulder PROM and AAROM to be at least 120 degrees flexion and abduction, 45 degrees ER    Status On-going               PT Long Term Goals - 05/10/21 1139       PT LONG TERM GOAL #1   Title AROM R shoulder to be at least 150 degrees flexion and ABD without pain    Status On-going      PT  LONG TERM GOAL #2   Title MMT in R shoulder flexion and abduction to be at least 4/5    Status On-going                   Plan - 05/21/21 [place] arrives today doing OK, a bit down since she had to put her dog down the other day. Feels like the DN really helped last time, shes noticed ROM is much better throughout her day. Able to progress band work for flexion and abduction, does continue to need cues to avoid compensation patterns (mostly thoracic arching) however. Per protocol, shes only to do concentric exercises for now, still needs cues to maintain this precaution but once she is able to do this consistently I think we could add a bit of band strengthening to her HEP. Continued with DN and STM afterwards, will continue as appropriate per protocol moving forward.    Personal Factors and Comorbidities Past/Current Experience;Comorbidity 3+;Time since onset of injury/illness/exacerbation    Examination-Activity Limitations Bathing;Bed Mobility;Reach Overhead;Self Feeding;Caring for Others;Sleep;Carry;Dressing;Hygiene/Grooming;Lift;Toileting    Examination-Participation Restrictions Meal Prep;Cleaning;Community Activity;Driving;Interpersonal Relationship;Shop;Laundry;Yard Work;Medication Management    Stability/Clinical Decision Making Stable/Uncomplicated    Clinical Decision Making Low    Rehab Potential Good    PT Frequency Other (comment)    PT Duration 8 weeks    PT Treatment/Interventions ADLs/Self Care Home Management;Biofeedback;Cryotherapy;Electrical Stimulation;Iontophoresis 4mg /ml Dexamethasone;Moist Heat;Ultrasound;Functional mobility training;Therapeutic activities;Therapeutic exercise;Patient/family education;Manual techniques;Scar mobilization;Passive range of motion;Taping;Vasopneumatic Device    PT Next Visit Plan how does she feel after DN, do we need to do it again? progress per protocol otherwise    PT Home Exercise Plan  [id number]    Consulted and Agree with Plan of Care Patient             Patient will benefit from skilled therapeutic intervention in order to improve the following deficits and impairments:  Decreased range of motion, Increased fascial restricitons, Increased muscle spasms, Impaired UE functional use, Decreased activity tolerance, Decreased knowledge of precautions, Impaired perceived functional ability, Pain, Decreased scar mobility, Hypomobility, Improper body mechanics, Decreased strength, Postural dysfunction  Visit Diagnosis: Muscle weakness (generalized)  Localized  edema  Stiffness of right shoulder, not elsewhere classified  Acute pain of right shoulder  Abnormal posture     Problem List Patient Active Problem List   Diagnosis Date Noted   De Quervain's tenosynovitis, right 07/22/2019   Abrasion of right knee 03/28/2019   Contusion of right chest wall 03/28/2019   Sprain of right wrist 03/28/2019   Bilateral carpal tunnel syndrome 03/17/2019   NAFLD (nonalcoholic fatty liver disease) 03/15/2019   Tendinitis of left rotator cuff 01/28/2019   Capsulitis of right shoulder 01/28/2019   Chronic right shoulder pain 01/21/2019   History of hepatitis C 01/21/2019   Joint pain in both hands 01/18/2019   Need for influenza vaccination 01/18/2019   Depression with anxiety 09/15/2018   Seasonal allergic rhinitis due to pollen 03/16/2018   B12 deficiency 07/16/2017   Neuropathy 07/15/2017   Spinal stenosis of lumbar region with neurogenic claudication 07/15/2017   Elevated cholesterol 07/15/2017   Healthcare maintenance 07/15/2017   Essential hypertension 07/15/2017   Abnormal CBC 07/15/2017   Monoclonal paraproteinemia 03/23/2012   Ann Lions PT, DPT, PN2   Supplemental Physical Therapist [place]. [place], [place], [address] Phone: [phone]   Fax:  [phone]  Name:  Michele Hanson MRN: [id number] Date of Birth: 1950-07-15

## 2021-05-23 ENCOUNTER — Encounter: Payer: Self-pay | Admitting: Physical Therapy

## 2021-05-23 ENCOUNTER — Other Ambulatory Visit: Payer: Self-pay

## 2021-05-23 ENCOUNTER — Ambulatory Visit: Payer: Medicare Other | Admitting: Physical Therapy

## 2021-05-23 DIAGNOSIS — M25511 Pain in right shoulder: Secondary | ICD-10-CM

## 2021-05-23 DIAGNOSIS — M25611 Stiffness of right shoulder, not elsewhere classified: Secondary | ICD-10-CM

## 2021-05-23 DIAGNOSIS — R600 Localized edema: Secondary | ICD-10-CM

## 2021-05-23 DIAGNOSIS — M6281 Muscle weakness (generalized): Secondary | ICD-10-CM | POA: Diagnosis not present

## 2021-05-23 NOTE — Therapy (Signed)
[place]. [place], [place], [address] Phone: [phone]   Fax:  [phone]  Physical Therapy Treatment  Patient Details  Name: Michele Hanson MRN: [id number] Date of Birth: May 27, 1950 Referring Provider (PT): Ophelia Charter   Encounter Date: 05/23/2021    Past Medical History:  Diagnosis Date   Anxiety    Arthritis    Depression    Fatty liver disease, nonalcoholic    GERD (gastroesophageal reflux disease)    Hepatitis 1973   hep c-was treated   Hyperlipemia    Hypertension     Past Surgical History:  Procedure Laterality Date   BACK SURGERY     CARPAL TUNNEL RELEASE Bilateral    CHOLECYSTECTOMY     DIAGNOSTIC LAPAROSCOPY     INCONTINENCE SURGERY     LAPAROSCOPIC LYSIS OF ADHESIONS     REVERSE SHOULDER ARTHROPLASTY Right 03/20/2021   Procedure: REVERSE SHOULDER ARTHROPLASTY;  Surgeon: Hiram Gash, MD;  Location: [place];  Service: Orthopedics;  Laterality: Right;    There were no vitals filed for this visit.   Subjective Assessment - 05/23/21 1104     Subjective Woke up with pain last night, today its better than it was but still aches.    Currently in Pain? Yes    Pain Score 1     Pain Location Shoulder    Pain Orientation Right                               [facility] Adult PT Treatment/Exercise - 05/23/21 0001       Shoulder Exercises: Standing   Flexion Both;Strengthening;20 reps;Weights    Shoulder Flexion Weight (lbs) 1    ABduction Strengthening;Both;20 reps;Weights    Shoulder ABduction Weight (lbs) 1    Row Strengthening;Both;20 reps;Theraband   another set PTA assist eccentrics   Theraband Level (Shoulder Row) Level 2 (Red)    Other Standing Exercises Wall flex & and pillow case x10 each      Shoulder Exercises: ROM/Strengthening   UBE (Upper Arm Bike) L1 x 3 min each      Manual Therapy   Manual Therapy Soft tissue mobilization;Joint  mobilization    Manual therapy comments gentle GH distraction and oscillation    Joint Mobilization GH grade I-II    Soft tissue mobilization R deltoid and UT    Passive ROM fle,ER, scaption and abd                       PT Short Term Goals - 05/10/21 1139       PT SHORT TERM GOAL #1   Title Will be compliant with appropriate progressive HEP    Status Achieved      PT SHORT TERM GOAL #2   Title R shoulder PROM and AAROM to be at least 120 degrees flexion and abduction, 45 degrees ER    Status On-going               PT Long Term Goals - 05/10/21 1139       PT LONG TERM GOAL #1   Title AROM R shoulder to be at least 150 degrees flexion and ABD without pain    Status On-going      PT LONG TERM GOAL #2   Title MMT in R shoulder flexion and abduction to be at least 4/5    Status On-going  Plan - 05/23/21 1116     Clinical Impression Statement Some soreness present in R shoulder upon entering clinic. Progressed with active shoulder flexion and abduction with light resistance, she did have some R shoulder elevation with this. Tactile cues to squeeze shoulder blades together with rows. Cue to keep R elbow in with shoulder flexion up wall. cues needed to relax with PROM.    Personal Factors and Comorbidities Past/Current Experience;Comorbidity 3+;Time since onset of injury/illness/exacerbation    Examination-Activity Limitations Bathing;Bed Mobility;Reach Overhead;Self Feeding;Caring for Others;Sleep;Carry;Dressing;Hygiene/Grooming;Lift;Toileting    Examination-Participation Restrictions Meal Prep;Cleaning;Community Activity;Driving;Interpersonal Relationship;Shop;Laundry;Yard Work;Medication Management    Stability/Clinical Decision Making Stable/Uncomplicated    Rehab Potential Good    PT Duration 8 weeks    PT Treatment/Interventions ADLs/Self Care Home Management;Biofeedback;Cryotherapy;Electrical Stimulation;Iontophoresis 4mg /ml  Dexamethasone;Moist Heat;Ultrasound;Functional mobility training;Therapeutic activities;Therapeutic exercise;Patient/family education;Manual techniques;Scar mobilization;Passive range of motion;Taping;Vasopneumatic Device    PT Next Visit Plan how does she feel after DN, do we need to do it again? progress per protocol otherwise             Patient will benefit from skilled therapeutic intervention in order to improve the following deficits and impairments:  Decreased range of motion, Increased fascial restricitons, Increased muscle spasms, Impaired UE functional use, Decreased activity tolerance, Decreased knowledge of precautions, Impaired perceived functional ability, Pain, Decreased scar mobility, Hypomobility, Improper body mechanics, Decreased strength, Postural dysfunction  Visit Diagnosis: Muscle weakness (generalized)  Stiffness of right shoulder, not elsewhere classified  Acute pain of right shoulder  Localized edema     Problem List Patient Active Problem List   Diagnosis Date Noted   De Quervain's tenosynovitis, right 07/22/2019   Abrasion of right knee 03/28/2019   Contusion of right chest wall 03/28/2019   Sprain of right wrist 03/28/2019   Bilateral carpal tunnel syndrome 03/17/2019   NAFLD (nonalcoholic fatty liver disease) 03/15/2019   Tendinitis of left rotator cuff 01/28/2019   Capsulitis of right shoulder 01/28/2019   Chronic right shoulder pain 01/21/2019   History of hepatitis C 01/21/2019   Joint pain in both hands 01/18/2019   Need for influenza vaccination 01/18/2019   Depression with anxiety 09/15/2018   Seasonal allergic rhinitis due to pollen 03/16/2018   B12 deficiency 07/16/2017   Neuropathy 07/15/2017   Spinal stenosis of lumbar region with neurogenic claudication 07/15/2017   Elevated cholesterol 07/15/2017   Healthcare maintenance 07/15/2017   Essential hypertension 07/15/2017   Abnormal CBC 07/15/2017   Monoclonal paraproteinemia  03/23/2012    Scot Jun, PTA 05/23/2021, 11:43 AM  [place]. [place], [place], [address] Phone: [phone]   Fax:  [phone]  Name: Michele Hanson MRN: [id number] Date of Birth: [birth year]

## 2021-05-28 ENCOUNTER — Other Ambulatory Visit: Payer: Self-pay

## 2021-05-28 ENCOUNTER — Encounter: Payer: Self-pay | Admitting: Physical Therapy

## 2021-05-28 ENCOUNTER — Ambulatory Visit: Payer: Medicare Other | Admitting: Physical Therapy

## 2021-05-28 DIAGNOSIS — M6281 Muscle weakness (generalized): Secondary | ICD-10-CM

## 2021-05-28 DIAGNOSIS — M25611 Stiffness of right shoulder, not elsewhere classified: Secondary | ICD-10-CM

## 2021-05-28 DIAGNOSIS — M25511 Pain in right shoulder: Secondary | ICD-10-CM

## 2021-05-28 NOTE — Therapy (Signed)
[place]. [place], [place], [address] Phone: [phone]   Fax:  [phone]  Physical Therapy Treatment  Patient Details  Name: Michele Hanson MRN: [id number] Date of Birth: 02/24/1951 Referring Provider (PT): Ophelia Charter   Encounter Date: 05/28/2021   PT End of Session - 05/28/21 1359     Visit Number 14    Number of Visits 21    Date for PT Re-Evaluation 06/12/21    Authorization Type [facility]    Authorization Time Period 04/17/21 to 06/12/21    Progress Note Due on Visit 20    PT Start Time 1319    PT Stop Time 1357    PT Time Calculation (min) 38 min    Activity Tolerance Patient tolerated treatment well    Behavior During Therapy [facility] for tasks assessed/performed             Past Medical History:  Diagnosis Date   Anxiety    Arthritis    Depression    Fatty liver disease, nonalcoholic    GERD (gastroesophageal reflux disease)    Hepatitis 1973   hep c-was treated   Hyperlipemia    Hypertension     Past Surgical History:  Procedure Laterality Date   BACK SURGERY     CARPAL TUNNEL RELEASE Bilateral    CHOLECYSTECTOMY     DIAGNOSTIC LAPAROSCOPY     INCONTINENCE SURGERY     LAPAROSCOPIC LYSIS OF ADHESIONS     REVERSE SHOULDER ARTHROPLASTY Right 03/20/2021   Procedure: REVERSE SHOULDER ARTHROPLASTY;  Surgeon: Hiram Gash, MD;  Location: [place];  Service: Orthopedics;  Laterality: Right;    There were no vitals filed for this visit.   Subjective Assessment - 05/28/21 1320     Subjective Still just feeling achey, nothing major and just the usual. My next surgeon's appt is early march.    Patient Stated Goals would like to get arm back to normal- as much as I can after the procedure    Currently in Pain? Yes    Pain Score 1     Pain Location Shoulder    Pain Orientation Right    Pain Descriptors / Indicators Aching;Throbbing    Pain Type Acute pain                                [facility] Adult PT Treatment/Exercise - 05/28/21 0001       Shoulder Exercises: Supine   External Rotation AROM;Right;10 reps    External Rotation Limitations concentric only, passiv return to start; tactile cues to remain within precautions    Flexion Strengthening;Right    Shoulder Flexion Weight (lbs) 1    Flexion Limitations 2 reps to complete 10 (switched to supine)    ABduction Strengthening;10 reps;Right;Weights    Shoulder ABduction Weight (lbs) 1    ABduction Limitations concentric only, PT guided PROM return to start    Other Supine Exercises flexion, abduction, ER shoulder stretches to tolerance (ER <30 degrees per protocol), ROM improving      Shoulder Exercises: Seated   Flexion Right    Flexion Weight (lbs) 1    Flexion Limitations cues for passive let down/avoiding eccentrics x8    Other Seated Exercises seated flexion shoulder stretch x10 5 second holds table top slides      Manual Therapy   Manual Therapy Soft tissue mobilization    Soft  tissue mobilization R deltoid and UT                     PT Education - 05/28/21 1359     Education Details make more appts plz    Person(s) Educated Patient    Methods Explanation    Comprehension Verbalized understanding              PT Short Term Goals - 05/10/21 1139       PT SHORT TERM GOAL #1   Title Will be compliant with appropriate progressive HEP    Status Achieved      PT SHORT TERM GOAL #2   Title R shoulder PROM and AAROM to be at least 120 degrees flexion and abduction, 45 degrees ER    Status On-going               PT Long Term Goals - 05/10/21 1139       PT LONG TERM GOAL #1   Title AROM R shoulder to be at least 150 degrees flexion and ABD without pain    Status On-going      PT LONG TERM GOAL #2   Title MMT in R shoulder flexion and abduction to be at least 4/5    Status On-going                   Plan - 05/28/21 1359      Clinical Impression Statement Michele Hanson arrives today doing well, sees the MD some time in early March. Continued working on strengthening as tolerated and within restrictions of protocol. She is at 10 weeks now, we progressed strengthening as tolerated and able. Discussed with her we should be able to push shoulder a little harder after another 2 weeks. Will continue to progress as able.    Personal Factors and Comorbidities Past/Current Experience;Comorbidity 3+;Time since onset of injury/illness/exacerbation    Examination-Activity Limitations Bathing;Bed Mobility;Reach Overhead;Self Feeding;Caring for Others;Sleep;Carry;Dressing;Hygiene/Grooming;Lift;Toileting    Examination-Participation Restrictions Meal Prep;Cleaning;Community Activity;Driving;Interpersonal Relationship;Shop;Laundry;Yard Work;Medication Management    Stability/Clinical Decision Making Stable/Uncomplicated    Clinical Decision Making Low    Rehab Potential Good    PT Frequency Other (comment)    PT Duration 8 weeks    PT Treatment/Interventions ADLs/Self Care Home Management;Biofeedback;Cryotherapy;Electrical Stimulation;Iontophoresis 4mg /ml Dexamethasone;Moist Heat;Ultrasound;Functional mobility training;Therapeutic activities;Therapeutic exercise;Patient/family education;Manual techniques;Scar mobilization;Passive range of motion;Taping;Vasopneumatic Device    PT Next Visit Plan needs more DN to RUE; continue to progress thru protocol    PT [place] and Agree with Plan of Care Patient             Patient will benefit from skilled therapeutic intervention in order to improve the following deficits and impairments:  Decreased range of motion, Increased fascial restricitons, Increased muscle spasms, Impaired UE functional use, Decreased activity tolerance, Decreased knowledge of precautions, Impaired perceived functional ability, Pain, Decreased scar mobility, Hypomobility, Improper body  mechanics, Decreased strength, Postural dysfunction  Visit Diagnosis: Muscle weakness (generalized)  Acute pain of right shoulder  Stiffness of right shoulder, not elsewhere classified     Problem List Patient Active Problem List   Diagnosis Date Noted   De Quervain's tenosynovitis, right 07/22/2019   Abrasion of right knee 03/28/2019   Contusion of right chest wall 03/28/2019   Sprain of right wrist 03/28/2019   Bilateral carpal tunnel syndrome 03/17/2019   NAFLD (nonalcoholic fatty liver disease) 03/15/2019   Tendinitis of left rotator cuff 01/28/2019   Capsulitis  of right shoulder 01/28/2019   Chronic right shoulder pain 01/21/2019   History of hepatitis C 01/21/2019   Joint pain in both hands 01/18/2019   Need for influenza vaccination 01/18/2019   Depression with anxiety 09/15/2018   Seasonal allergic rhinitis due to pollen 03/16/2018   B12 deficiency 07/16/2017   Neuropathy 07/15/2017   Spinal stenosis of lumbar region with neurogenic claudication 07/15/2017   Elevated cholesterol 07/15/2017   Healthcare maintenance 07/15/2017   Essential hypertension 07/15/2017   Abnormal CBC 07/15/2017   Monoclonal paraproteinemia 03/23/2012   Ann Lions PT, DPT, PN2   Supplemental Physical Therapist [address]. [place], [place], [address] Phone: [phone]   Fax:  [phone]  Name: Michele Hanson MRN: [id number] Date of Birth: 1950-07-31

## 2021-05-30 ENCOUNTER — Encounter: Payer: Self-pay | Admitting: Physical Therapy

## 2021-05-30 ENCOUNTER — Other Ambulatory Visit: Payer: Self-pay

## 2021-05-30 ENCOUNTER — Ambulatory Visit: Payer: Medicare Other | Admitting: Physical Therapy

## 2021-05-30 DIAGNOSIS — M6281 Muscle weakness (generalized): Secondary | ICD-10-CM

## 2021-05-30 DIAGNOSIS — R600 Localized edema: Secondary | ICD-10-CM

## 2021-05-30 DIAGNOSIS — M25511 Pain in right shoulder: Secondary | ICD-10-CM

## 2021-05-30 DIAGNOSIS — R293 Abnormal posture: Secondary | ICD-10-CM

## 2021-05-30 DIAGNOSIS — M25611 Stiffness of right shoulder, not elsewhere classified: Secondary | ICD-10-CM

## 2021-05-30 NOTE — Therapy (Signed)
[facility] [address] [place], [place], [address] Phone: [phone]   Fax:  [phone]  Physical Therapy Treatment  Patient Details  Name: Michele Hanson MRN: [id number] Date of Birth: 09-18-1950 Referring Provider (PT): Ramond Marrow   Encounter Date: 05/30/2021   PT End of Session - 05/30/21 1141     Visit Number 15    Date for PT Re-Evaluation 06/12/21    Authorization Time Period 04/17/21 to 06/12/21    PT Start Time 1104    PT Stop Time 1145    PT Time Calculation (min) 41 min    Activity Tolerance Patient tolerated treatment well    Behavior During Therapy [facility] for tasks assessed/performed             Past Medical History:  Diagnosis Date   Anxiety    Arthritis    Depression    Fatty liver disease, nonalcoholic    GERD (gastroesophageal reflux disease)    Hepatitis 1973   hep c-was treated   Hyperlipemia    Hypertension     Past Surgical History:  Procedure Laterality Date   BACK SURGERY     CARPAL TUNNEL RELEASE Bilateral    CHOLECYSTECTOMY     DIAGNOSTIC LAPAROSCOPY     INCONTINENCE SURGERY     LAPAROSCOPIC LYSIS OF ADHESIONS     REVERSE SHOULDER ARTHROPLASTY Right 03/20/2021   Procedure: REVERSE SHOULDER ARTHROPLASTY;  Surgeon: Bjorn Pippin, MD;  Location:  SURGERY CENTER;  Service: Orthopedics;  Laterality: Right;    There were no vitals filed for this visit.   Subjective Assessment - 05/30/21 1105     Subjective Shoulder aches a little, dose not feel that bad    Currently in Pain? Yes    Pain Score 1     Pain Location Shoulder    Pain Orientation Right                               [facility] Adult PT Treatment/Exercise - 05/30/21 0001       Shoulder Exercises: Supine   External Rotation Left;20 reps   manual resistance     Shoulder Exercises: Standing   Flexion 20 reps;Both;Strengthening;Weights   3lb WaTE bar   ABduction Strengthening;Both;20  reps;Weights    Shoulder ABduction Weight (lbs) 1    Extension Strengthening;Both;20 reps;Theraband    Theraband Level (Shoulder Extension) Level 3 (Green)    Row Strengthening;Both;20 reps;Theraband    Theraband Level (Shoulder Row) Level 3 (Green)    Other Standing Exercises Wall flex 1lb & and pillow case 2x10 each      Shoulder Exercises: ROM/Strengthening   UBE (Upper Arm Bike) L1 x 3 min each      Manual Therapy   Manual Therapy Soft tissue mobilization    Manual therapy comments gentle GH distraction and oscillation    Passive ROM fle,ER, scaption and abd                       PT Short Term Goals - 05/10/21 1139       PT SHORT TERM GOAL #1   Title Will be compliant with appropriate progressive HEP    Status Achieved      PT SHORT TERM GOAL #2   Title R shoulder PROM and AAROM to be at least 120 degrees flexion and abduction, 45 degrees ER    Status On-going  PT Long Term Goals - 05/30/21 1142       PT LONG TERM GOAL #1   Title AROM R shoulder to be at least 150 degrees flexion and ABD without pain    Status On-going      PT LONG TERM GOAL #2   Title MMT in R shoulder flexion and abduction to be at least 4/5    Status On-going      PT LONG TERM GOAL #3   Title Will tolerate initiation of active IR/extension ROM and strengthening R shoulder at 12 weeks as per protocol    Status On-going      PT LONG TERM GOAL #4   Title Will be able to dress and perform self hygiene with R UE without increased pain    Status Partially Met                   Plan - 05/30/21 1143     Clinical Impression Statement Pt continues to do well within protocol limitations. She remains very weak with R shoulder external rotation and abduction. R shoulder elevation present with flexion and abduction. cue to keep shoulders down with standing rows. She has good PROM of the R shoulder.    Personal Factors and Comorbidities Past/Current  Experience;Comorbidity 3+;Time since onset of injury/illness/exacerbation    Examination-Activity Limitations Bathing;Bed Mobility;Reach Overhead;Self Feeding;Caring for Others;Sleep;Carry;Dressing;Hygiene/Grooming;Lift;Toileting    Examination-Participation Restrictions Meal Prep;Cleaning;Community Activity;Driving;Interpersonal Relationship;Shop;Laundry;Yard Work;Medication Management    Rehab Potential Good    PT Treatment/Interventions ADLs/Self Care Home Management;Biofeedback;Cryotherapy;Electrical Stimulation;Iontophoresis 4mg /ml Dexamethasone;Moist Heat;Ultrasound;Functional mobility training;Therapeutic activities;Therapeutic exercise;Patient/family education;Manual techniques;Scar mobilization;Passive range of motion;Taping;Vasopneumatic Device    PT Next Visit Plan needs more DN to RUE; continue to progress thru protocol             Patient will benefit from skilled therapeutic intervention in order to improve the following deficits and impairments:  Decreased range of motion, Increased fascial restricitons, Increased muscle spasms, Impaired UE functional use, Decreased activity tolerance, Decreased knowledge of precautions, Impaired perceived functional ability, Pain, Decreased scar mobility, Hypomobility, Improper body mechanics, Decreased strength, Postural dysfunction  Visit Diagnosis: Muscle weakness (generalized)  Stiffness of right shoulder, not elsewhere classified  Localized edema  Abnormal posture  Acute pain of right shoulder     Problem List Patient Active Problem List   Diagnosis Date Noted   De Quervain's tenosynovitis, right 07/22/2019   Abrasion of right knee 03/28/2019   Contusion of right chest wall 03/28/2019   Sprain of right wrist 03/28/2019   Bilateral carpal tunnel syndrome 03/17/2019   NAFLD (nonalcoholic fatty liver disease) 03/15/2019   Tendinitis of left rotator cuff 01/28/2019   Capsulitis of right shoulder 01/28/2019   Chronic right  shoulder pain 01/21/2019   History of hepatitis C 01/21/2019   Joint pain in both hands 01/18/2019   Need for influenza vaccination 01/18/2019   Depression with anxiety 09/15/2018   Seasonal allergic rhinitis due to pollen 03/16/2018   B12 deficiency 07/16/2017   Neuropathy 07/15/2017   Spinal stenosis of lumbar region with neurogenic claudication 07/15/2017   Elevated cholesterol 07/15/2017   Healthcare maintenance 07/15/2017   Essential hypertension 07/15/2017   Abnormal CBC 07/15/2017   Monoclonal paraproteinemia 03/23/2012    Scot Jun, PTA 05/30/2021, 11:45 AM  [place]. [place], [place], [address] Phone: [phone]   Fax:  [phone]  Name: Rogina Schiano MRN: [id number] Date of Birth: 18-Apr-1950

## 2021-06-04 ENCOUNTER — Encounter: Payer: Self-pay | Admitting: Physical Therapy

## 2021-06-04 ENCOUNTER — Other Ambulatory Visit: Payer: Self-pay

## 2021-06-04 ENCOUNTER — Ambulatory Visit: Payer: Medicare Other | Admitting: Physical Therapy

## 2021-06-04 DIAGNOSIS — M6281 Muscle weakness (generalized): Secondary | ICD-10-CM

## 2021-06-04 DIAGNOSIS — R293 Abnormal posture: Secondary | ICD-10-CM

## 2021-06-04 DIAGNOSIS — R600 Localized edema: Secondary | ICD-10-CM

## 2021-06-04 DIAGNOSIS — M25611 Stiffness of right shoulder, not elsewhere classified: Secondary | ICD-10-CM

## 2021-06-04 NOTE — Therapy (Signed)
[place]. [place], [place], [address] Phone: [phone]   Fax:  [phone]  Physical Therapy Treatment  Patient Details  Name: Michele Hanson MRN: [id number] Date of Birth: 10-Feb-1951 Referring Provider (PT): Ophelia Charter   Encounter Date: 06/04/2021   PT End of Session - 06/04/21 1403     Visit Number 16    Number of Visits 21    Date for PT Re-Evaluation 06/12/21    Authorization Type [facility] MCR    Authorization Time Period 04/17/21 to 06/12/21    Progress Note Due on Visit 20    PT Start Time 1315    PT Stop Time 6333   ice not included in billing   PT Time Calculation (min) 33 min    Activity Tolerance Patient tolerated treatment well    Behavior During Therapy [facility] for tasks assessed/performed             Past Medical History:  Diagnosis Date   Anxiety    Arthritis    Depression    Fatty liver disease, nonalcoholic    GERD (gastroesophageal reflux disease)    Hepatitis 1973   hep c-was treated   Hyperlipemia    Hypertension     Past Surgical History:  Procedure Laterality Date   BACK SURGERY     CARPAL TUNNEL RELEASE Bilateral    CHOLECYSTECTOMY     DIAGNOSTIC LAPAROSCOPY     INCONTINENCE SURGERY     LAPAROSCOPIC LYSIS OF ADHESIONS     REVERSE SHOULDER ARTHROPLASTY Right 03/20/2021   Procedure: REVERSE SHOULDER ARTHROPLASTY;  Surgeon: Hiram Gash, MD;  Location: [place];  Service: Orthopedics;  Laterality: Right;    There were no vitals filed for this visit.   Subjective Assessment - 06/04/21 1318     Subjective I was doing something stupid earlier today and carrying a bed spread, I stepped on the corner of the bed spread and tripped and fell into the door jam with my right shoulder. Its been sore usually but now its just a little more sore. There's still something that catches, feels muscular.    Patient Stated Goals would like to get arm back to normal- as much as  I can after the procedure    Currently in Pain? Yes    Pain Score 3     Pain Location Shoulder    Pain Orientation Right    Pain Descriptors / Indicators Aching;Sore    Pain Type Acute pain    Pain Radiating Towards down RUE to elbow    Pain Onset 1 to 4 weeks ago    Pain Frequency Constant                               [facility] Adult PT Treatment/Exercise - 06/04/21 0001       Shoulder Exercises: Supine   External Rotation Left;10 reps    External Rotation Limitations tactile cues for good form, within 30 degree range    Flexion AROM;PROM;10 reps   x2 one PROM, one AROM   ABduction PROM;AROM;20 reps   x10 passive, x10 active     Cryotherapy   Number Minutes Cryotherapy 8 Minutes   not included in billing   Cryotherapy Location Shoulder    Type of Cryotherapy Ice pack      Manual Therapy   Manual Therapy Soft tissue mobilization    Soft tissue mobilization  aggressive STM R deltoid, able to reduce spasms by at least 50%                     PT Education - 06/04/21 1403     Education Details lighter session for today due to acute injury/irrtation    Person(s) Educated Patient    Methods Explanation    Comprehension Verbalized understanding              PT Short Term Goals - 05/10/21 1139       PT SHORT TERM GOAL #1   Title Will be compliant with appropriate progressive HEP    Status Achieved      PT SHORT TERM GOAL #2   Title R shoulder PROM and AAROM to be at least 120 degrees flexion and abduction, 45 degrees ER    Status On-going               PT Long Term Goals - 05/30/21 1142       PT LONG TERM GOAL #1   Title AROM R shoulder to be at least 150 degrees flexion and ABD without pain    Status On-going      PT LONG TERM GOAL #2   Title MMT in R shoulder flexion and abduction to be at least 4/5    Status On-going      PT LONG TERM GOAL #3   Title Will tolerate initiation of active IR/extension ROM and strengthening  R shoulder at 12 weeks as per protocol    Status On-going      PT LONG TERM GOAL #4   Title Will be able to dress and perform self hygiene with R UE without increased pain    Status Partially Met                   Plan - 06/04/21 1404     Clinical Impression [place] arrives today with increased shoulder pain- was carrying something at home, tripped and fell into door jam with her shoulder, just a lot more discomfort than usual.  Focused on reducing pain and irritation today with more gentle session than usual, spent some time with a ice pack to try to reduce pain, followed this immediately with manual. Was able to do some more exercises with her shoulder after this- sounds like shes seeing the doctor next week, I think hopefully we will be able to progress onto week 12 of protocol soon as well.    Personal Factors and Comorbidities Past/Current Experience;Comorbidity 3+;Time since onset of injury/illness/exacerbation    Examination-Activity Limitations Bathing;Bed Mobility;Reach Overhead;Self Feeding;Caring for Others;Sleep;Carry;Dressing;Hygiene/Grooming;Lift;Toileting    Examination-Participation Restrictions Meal Prep;Cleaning;Community Activity;Driving;Interpersonal Relationship;Shop;Laundry;Yard Work;Medication Management    Stability/Clinical Decision Making Stable/Uncomplicated    Clinical Decision Making Low    Rehab Potential Good    PT Frequency Other (comment)    PT Duration 8 weeks    PT Treatment/Interventions ADLs/Self Care Home Management;Biofeedback;Cryotherapy;Electrical Stimulation;Iontophoresis 4mg /ml Dexamethasone;Moist Heat;Ultrasound;Functional mobility training;Therapeutic activities;Therapeutic exercise;Patient/family education;Manual techniques;Scar mobilization;Passive range of motion;Taping;Vasopneumatic Device    PT Next Visit Plan needs more DN to RUE; continue to progress thru protocol    PT [place] and Agree with  Plan of Care Patient             Patient will benefit from skilled therapeutic intervention in order to improve the following deficits and impairments:  Decreased range of motion, Increased fascial restricitons, Increased muscle spasms, Impaired UE  functional use, Decreased activity tolerance, Decreased knowledge of precautions, Impaired perceived functional ability, Pain, Decreased scar mobility, Hypomobility, Improper body mechanics, Decreased strength, Postural dysfunction  Visit Diagnosis: Muscle weakness (generalized)  Stiffness of right shoulder, not elsewhere classified  Localized edema  Abnormal posture     Problem List Patient Active Problem List   Diagnosis Date Noted   De Quervain's tenosynovitis, right 07/22/2019   Abrasion of right knee 03/28/2019   Contusion of right chest wall 03/28/2019   Sprain of right wrist 03/28/2019   Bilateral carpal tunnel syndrome 03/17/2019   NAFLD (nonalcoholic fatty liver disease) 03/15/2019   Tendinitis of left rotator cuff 01/28/2019   Capsulitis of right shoulder 01/28/2019   Chronic right shoulder pain 01/21/2019   History of hepatitis C 01/21/2019   Joint pain in both hands 01/18/2019   Need for influenza vaccination 01/18/2019   Depression with anxiety 09/15/2018   Seasonal allergic rhinitis due to pollen 03/16/2018   B12 deficiency 07/16/2017   Neuropathy 07/15/2017   Spinal stenosis of lumbar region with neurogenic claudication 07/15/2017   Elevated cholesterol 07/15/2017   Healthcare maintenance 07/15/2017   Essential hypertension 07/15/2017   Abnormal CBC 07/15/2017   Monoclonal paraproteinemia 03/23/2012   Ann Lions PT, DPT, PN2   Supplemental Physical Therapist [place]. [place], [place], [address] Phone: [phone]   Fax:  [phone]  Name: Michele Hanson MRN: [id number] Date of Birth: 1950-04-22

## 2021-06-06 ENCOUNTER — Ambulatory Visit: Payer: Medicare Other | Admitting: Physical Therapy

## 2021-06-06 ENCOUNTER — Other Ambulatory Visit: Payer: Self-pay

## 2021-06-06 ENCOUNTER — Encounter: Payer: Self-pay | Admitting: Physical Therapy

## 2021-06-06 DIAGNOSIS — R293 Abnormal posture: Secondary | ICD-10-CM

## 2021-06-06 DIAGNOSIS — R600 Localized edema: Secondary | ICD-10-CM

## 2021-06-06 DIAGNOSIS — M6281 Muscle weakness (generalized): Secondary | ICD-10-CM | POA: Diagnosis not present

## 2021-06-06 NOTE — Therapy (Signed)
[place]. [place], [place], [address] Phone: [phone]   Fax:  [phone]  Physical Therapy Treatment  Patient Details  Name: Michele Hanson MRN: [id number] Date of Birth: June 12, 1950 Referring Provider (PT): Ophelia Charter   Encounter Date: 06/06/2021   PT End of Session - 06/06/21 1141     Visit Number 17    Date for PT Re-Evaluation 06/12/21    Authorization Time Period 04/17/21 to 06/12/21    PT Start Time 1100    PT Stop Time 1142    PT Time Calculation (min) 42 min    Activity Tolerance Patient tolerated treatment well    Behavior During Therapy [facility] for tasks assessed/performed             Past Medical History:  Diagnosis Date   Anxiety    Arthritis    Depression    Fatty liver disease, nonalcoholic    GERD (gastroesophageal reflux disease)    Hepatitis 1973   hep c-was treated   Hyperlipemia    Hypertension     Past Surgical History:  Procedure Laterality Date   BACK SURGERY     CARPAL TUNNEL RELEASE Bilateral    CHOLECYSTECTOMY     DIAGNOSTIC LAPAROSCOPY     INCONTINENCE SURGERY     LAPAROSCOPIC LYSIS OF ADHESIONS     REVERSE SHOULDER ARTHROPLASTY Right 03/20/2021   Procedure: REVERSE SHOULDER ARTHROPLASTY;  Surgeon: Hiram Gash, MD;  Location: [place];  Service: Orthopedics;  Laterality: Right;    There were no vitals filed for this visit.   Subjective Assessment - 06/06/21 1104     Subjective "its going ok today"    Currently in Pain? Yes    Pain Score 1     Pain Location Shoulder    Pain Orientation Right                               [facility] Adult PT Treatment/Exercise - 06/06/21 0001       Shoulder Exercises: Standing   Flexion 20 reps;Both;Strengthening;Weights   WaTE bar   Shoulder Flexion Weight (lbs) 3    ABduction Strengthening;Both;20 reps;Weights    Shoulder ABduction Weight (lbs) 1    Extension Strengthening;Both;20  reps;Theraband    Theraband Level (Shoulder Extension) Level 3 (Green)    Row Strengthening;Both;20 reps;Theraband    Theraband Level (Shoulder Row) Level 4 (Blue)    Other Standing Exercises Bilat UE flex pillow case on wall the RUE abduction 2x10      Shoulder Exercises: ROM/Strengthening   UBE (Upper Arm Bike) L1 x 3 min each      Manual Therapy   Manual Therapy Soft tissue mobilization    Passive ROM flex, ER, scaption and abd                       PT Short Term Goals - 05/10/21 1139       PT SHORT TERM GOAL #1   Title Will be compliant with appropriate progressive HEP    Status Achieved      PT SHORT TERM GOAL #2   Title R shoulder PROM and AAROM to be at least 120 degrees flexion and abduction, 45 degrees ER    Status On-going               PT Long Term Goals - 06/06/21 1142  PT LONG TERM GOAL #1   Title AROM R shoulder to be at least 150 degrees flexion and ABD without pain    Status On-going      PT LONG TERM GOAL #2   Title MMT in R shoulder flexion and abduction to be at least 4/5    Status On-going                   Plan - 06/06/21 1142     Clinical Impression Statement Pt reports decrease pain compared to last session. She did well overall today tolerating increase resistance with standing rows and extensions R shoulder ER remains very weak. R shoulder elevation noted with flexion and abduction, can correct some with cues. Cue needed to relax all allow PROM with MT.    Personal Factors and Comorbidities Past/Current Experience;Comorbidity 3+;Time since onset of injury/illness/exacerbation    Examination-Activity Limitations Bathing;Bed Mobility;Reach Overhead;Self Feeding;Caring for Others;Sleep;Carry;Dressing;Hygiene/Grooming;Lift;Toileting    Examination-Participation Restrictions Meal Prep;Cleaning;Community Activity;Driving;Interpersonal Relationship;Shop;Laundry;Yard Work;Medication Management    Stability/Clinical  Decision Making Stable/Uncomplicated    Rehab Potential Good    PT Treatment/Interventions ADLs/Self Care Home Management;Biofeedback;Cryotherapy;Electrical Stimulation;Iontophoresis 4mg /ml Dexamethasone;Moist Heat;Ultrasound;Functional mobility training;Therapeutic activities;Therapeutic exercise;Patient/family education;Manual techniques;Scar mobilization;Passive range of motion;Taping;Vasopneumatic Device    PT Next Visit Plan needs more DN to RUE; continue to progress thru protocol             Patient will benefit from skilled therapeutic intervention in order to improve the following deficits and impairments:  Decreased range of motion, Increased fascial restricitons, Increased muscle spasms, Impaired UE functional use, Decreased activity tolerance, Decreased knowledge of precautions, Impaired perceived functional ability, Pain, Decreased scar mobility, Hypomobility, Improper body mechanics, Decreased strength, Postural dysfunction  Visit Diagnosis: Muscle weakness (generalized)  Abnormal posture  Localized edema     Problem List Patient Active Problem List   Diagnosis Date Noted   De Quervain's tenosynovitis, right 07/22/2019   Abrasion of right knee 03/28/2019   Contusion of right chest wall 03/28/2019   Sprain of right wrist 03/28/2019   Bilateral carpal tunnel syndrome 03/17/2019   NAFLD (nonalcoholic fatty liver disease) 03/15/2019   Tendinitis of left rotator cuff 01/28/2019   Capsulitis of right shoulder 01/28/2019   Chronic right shoulder pain 01/21/2019   History of hepatitis C 01/21/2019   Joint pain in both hands 01/18/2019   Need for influenza vaccination 01/18/2019   Depression with anxiety 09/15/2018   Seasonal allergic rhinitis due to pollen 03/16/2018   B12 deficiency 07/16/2017   Neuropathy 07/15/2017   Spinal stenosis of lumbar region with neurogenic claudication 07/15/2017   Elevated cholesterol 07/15/2017   Healthcare maintenance 07/15/2017    Essential hypertension 07/15/2017   Abnormal CBC 07/15/2017   Monoclonal paraproteinemia 03/23/2012    Scot Jun, PTA 06/06/2021, 11:45 AM  [place]. [place], [place], [address] Phone: [phone]   Fax:  [phone]  Name: Kameela Leipold MRN: [id number] Date of Birth: [birth year]

## 2021-06-11 ENCOUNTER — Ambulatory Visit: Payer: Medicare Other | Admitting: Physical Therapy

## 2021-06-11 ENCOUNTER — Other Ambulatory Visit: Payer: Self-pay

## 2021-06-11 ENCOUNTER — Encounter: Payer: Self-pay | Admitting: Physical Therapy

## 2021-06-11 DIAGNOSIS — M6281 Muscle weakness (generalized): Secondary | ICD-10-CM | POA: Diagnosis not present

## 2021-06-11 DIAGNOSIS — R600 Localized edema: Secondary | ICD-10-CM

## 2021-06-11 DIAGNOSIS — M25511 Pain in right shoulder: Secondary | ICD-10-CM

## 2021-06-11 DIAGNOSIS — M25611 Stiffness of right shoulder, not elsewhere classified: Secondary | ICD-10-CM

## 2021-06-11 DIAGNOSIS — R293 Abnormal posture: Secondary | ICD-10-CM

## 2021-06-11 NOTE — Therapy (Signed)
[facility] [address] [place], [place], [address] Phone: [phone]   Fax:  [phone]  Physical Therapy Treatment  Patient Details  Name: Michele Hanson MRN: [id number] Date of Birth: October 30, 1950 Referring Provider (PT): Ramond Marrow   Encounter Date: 06/11/2021  Progress Note Reporting Period 05/10/21 to 06/11/21  See note below for Objective Data and Assessment of Progress/Goals.       PT End of Session - 06/11/21 1356     Visit Number 18    Number of Visits 30    Date for PT Re-Evaluation 07/23/21    Authorization Type [facility] MCR    Authorization Time Period 04/17/21 to 06/12/21; extended to 4/11    Progress Note Due on Visit 28    PT Start Time 1316    PT Stop Time 1356    PT Time Calculation (min) 40 min    Activity Tolerance Patient tolerated treatment well    Behavior During Therapy WFL for tasks assessed/performed             Past Medical History:  Diagnosis Date   Anxiety    Arthritis    Depression    Fatty liver disease, nonalcoholic    GERD (gastroesophageal reflux disease)    Hepatitis 1973   hep c-was treated   Hyperlipemia    Hypertension     Past Surgical History:  Procedure Laterality Date   BACK SURGERY     CARPAL TUNNEL RELEASE Bilateral    CHOLECYSTECTOMY     DIAGNOSTIC LAPAROSCOPY     INCONTINENCE SURGERY     LAPAROSCOPIC LYSIS OF ADHESIONS     REVERSE SHOULDER ARTHROPLASTY Right 03/20/2021   Procedure: REVERSE SHOULDER ARTHROPLASTY;  Surgeon: Bjorn Pippin, MD;  Location: [facility];  Service: Orthopedics;  Laterality: Right;    There were no vitals filed for this visit.   Subjective Assessment - 06/11/21 1320     Subjective Its been feeling good, last night it was really sore but I realized I had been using that arm to stir a big casserole it feels better now.    Patient Stated Goals would like to get arm back to normal- as much as I can after the procedure     Currently in Pain? Yes    Pain Score 1     Pain Location Shoulder    Pain Orientation Right    Pain Descriptors / Indicators Aching                [facility] PT Assessment - 06/11/21 0001       Assessment   Medical Diagnosis s/p reverse total shoulder    Referring Provider (PT) Dax Everardo Pacific    Onset Date/Surgical Date 03/20/21    Next MD Visit Dr. Everardo Pacific 3/7    Prior Therapy PT in the past for her back, shoulder, foot      Precautions   Precautions Shoulder    Precaution Comments [facility] protocol in chart under media      Restrictions   Weight Bearing Restrictions Yes    Other Position/Activity Restrictions less than 5 pounds R UE      Balance Screen   Has the patient fallen in the past 6 months No    Has the patient had a decrease in activity level because of a fear of falling?  No    Is the patient reluctant to leave their home because of a fear of falling?  No  Home Environment   Living Environment Private residence      Prior Function   Level of Independence Independent;Independent with basic ADLs    Vocation Retired    Visual merchandiser, pilates, swimming      Observation/Other Assessments   Focus on Therapeutic Outcomes (FOTO)  18      Posture/Postural Control   Posture/Postural Control Postural limitations    Postural Limitations Rounded Shoulders;Forward head;Increased thoracic kyphosis      AROM   Right Shoulder Flexion 121 Degrees   sitting AROM   Right Shoulder ABduction 112 Degrees   sitting AROM   Right Shoulder External Rotation 10 Degrees   sititng AROM     Strength   Strength Assessment Site Shoulder    Right/Left Shoulder Right;Left    Right Shoulder Flexion 4/5    Right Shoulder ABduction 3/5    Right/Left Elbow Right    Right Elbow Flexion 4+/5    Right Elbow Extension 4/5                           [facility] Adult PT Treatment/Exercise - 06/11/21 0001       Shoulder Exercises: Seated   Other Seated  Exercises UBE L1.5 3 min forward/3 min back      Shoulder Exercises: Standing   Flexion Strengthening;10 reps    Shoulder Flexion Weight (lbs) 4    Other Standing Exercises flexion AROM as far as she could with good form, then ladder for standing flexion stretch 10x10 second holds; same for ABD AROM against wall with hand in pillow case 10x10 second holds                     PT Education - 06/11/21 1356     Education Details POC moving forward    Person(s) Educated Patient    Methods Explanation    Comprehension Verbalized understanding              PT Short Term Goals - 06/11/21 1330       PT SHORT TERM GOAL #1   Title Will be compliant with appropriate progressive HEP    Baseline 2/28- not compliant with daily HEP    Time 4    Period Weeks    Status Achieved      PT SHORT TERM GOAL #2   Title R shoulder PROM and AAROM to be at least 120 degrees flexion and abduction, 45 degrees ER    Time 4    Period Weeks    Status Partially Met      PT SHORT TERM GOAL #3   Title Pain to be no more than 4/10 at worst    Baseline 2/28- met    Time 4    Period Weeks    Status Achieved      PT SHORT TERM GOAL #4   Title Soft tissue restrictions in upper traps and biceps/anterior shoulder to be resolved in order to improve pain and ROM    Baseline 2/28- much improved, some restrictions still sticking around    Time 4    Period Weeks    Status Partially Met               PT Long Term Goals - 06/11/21 1332       PT LONG TERM GOAL #1   Title AROM R shoulder to be at least 150 degrees flexion and ABD without pain  Time 8    Period Weeks    Status On-going      PT LONG TERM GOAL #2   Title MMT in R shoulder flexion and abduction to be at least 4/5    Time 8    Period Weeks    Status On-going      PT LONG TERM GOAL #3   Title Will tolerate initiation of active IR/extension ROM and strengthening R shoulder at 12 weeks as per protocol    Baseline will be  able to start on 3/1    Time 8    Period Weeks    Status On-going      PT LONG TERM GOAL #4   Title Will be able to dress and perform self hygiene with R UE without increased pain    Baseline 2/28- still having issues with shower tasks    Time 8    Period Weeks    Status Partially Met                   Plan - 06/11/21 1357     Clinical Impression Statement [name] arrives doing well today, she will be at the 12 week mark after surgery on 3/1 so we should be able to start increasing challenge of sessions moving forward from here. Sees Dr. Griffin Basil soon, we took objective measures and updated cert today. Otherwise continued working on functional strengthening and postural training. I think she will need at least another 6 weeks of skilled PT services to build strength moving forward, will continue to progress as able and tolerated moving forward.    Personal Factors and Comorbidities Past/Current Experience;Comorbidity 3+;Time since onset of injury/illness/exacerbation    Examination-Activity Limitations Bathing;Bed Mobility;Reach Overhead;Self Feeding;Caring for Others;Sleep;Carry;Dressing;Hygiene/Grooming;Lift;Toileting    Examination-Participation Restrictions Meal Prep;Cleaning;Community Activity;Driving;Interpersonal Relationship;Shop;Laundry;Yard Work;Medication Management    Stability/Clinical Decision Making Stable/Uncomplicated    Clinical Decision Making Low    Rehab Potential Good    PT Frequency 2x / week    PT Duration 6 weeks    PT Treatment/Interventions ADLs/Self Care Home Management;Biofeedback;Cryotherapy;Electrical Stimulation;Iontophoresis 4mg /ml Dexamethasone;Moist Heat;Ultrasound;Functional mobility training;Therapeutic activities;Therapeutic exercise;Patient/family education;Manual techniques;Scar mobilization;Passive range of motion;Taping;Vasopneumatic Device    PT Next Visit Plan needs more DN to RUE; continue to progress thru protocol. Update HEP. Week 12 is  3/1, progres protocol    PT Home Exercise Plan [id number]    Consulted and Agree with Plan of Care Patient             Patient will benefit from skilled therapeutic intervention in order to improve the following deficits and impairments:  Decreased range of motion, Increased fascial restricitons, Increased muscle spasms, Impaired UE functional use, Decreased activity tolerance, Decreased knowledge of precautions, Impaired perceived functional ability, Pain, Decreased scar mobility, Hypomobility, Improper body mechanics, Decreased strength, Postural dysfunction  Visit Diagnosis: Muscle weakness (generalized)  Abnormal posture  Localized edema  Stiffness of right shoulder, not elsewhere classified  Acute pain of right shoulder     Problem List Patient Active Problem List   Diagnosis Date Noted   De Quervain's tenosynovitis, right 07/22/2019   Abrasion of right knee 03/28/2019   Contusion of right chest wall 03/28/2019   Sprain of right wrist 03/28/2019   Bilateral carpal tunnel syndrome 03/17/2019   NAFLD (nonalcoholic fatty liver disease) 03/15/2019   Tendinitis of left rotator cuff 01/28/2019   Capsulitis of right shoulder 01/28/2019   Chronic right shoulder pain 01/21/2019   History of hepatitis C 01/21/2019   Joint pain in  both hands 01/18/2019   Need for influenza vaccination 01/18/2019   Depression with anxiety 09/15/2018   Seasonal allergic rhinitis due to pollen 03/16/2018   B12 deficiency 07/16/2017   Neuropathy 07/15/2017   Spinal stenosis of lumbar region with neurogenic claudication 07/15/2017   Elevated cholesterol 07/15/2017   Healthcare maintenance 07/15/2017   Essential hypertension 07/15/2017   Abnormal CBC 07/15/2017   Monoclonal paraproteinemia 03/23/2012   Ann Lions PT, DPT, PN2   Supplemental Physical Therapist [place]. [place], [place],  [address] Phone: [phone]   Fax:  [phone]  Name: Jan Walters MRN: [id number] Date of Birth: 12-26-1950

## 2021-06-13 ENCOUNTER — Encounter: Payer: Self-pay | Admitting: Physical Therapy

## 2021-06-13 ENCOUNTER — Ambulatory Visit: Payer: Medicare Other | Attending: Orthopaedic Surgery | Admitting: Physical Therapy

## 2021-06-13 ENCOUNTER — Other Ambulatory Visit: Payer: Self-pay

## 2021-06-13 ENCOUNTER — Ambulatory Visit: Payer: Medicare Other | Admitting: Physical Therapy

## 2021-06-13 DIAGNOSIS — M6281 Muscle weakness (generalized): Secondary | ICD-10-CM | POA: Diagnosis not present

## 2021-06-13 DIAGNOSIS — R293 Abnormal posture: Secondary | ICD-10-CM | POA: Diagnosis present

## 2021-06-13 DIAGNOSIS — R600 Localized edema: Secondary | ICD-10-CM | POA: Diagnosis present

## 2021-06-13 DIAGNOSIS — M25611 Stiffness of right shoulder, not elsewhere classified: Secondary | ICD-10-CM | POA: Diagnosis present

## 2021-06-13 DIAGNOSIS — M25511 Pain in right shoulder: Secondary | ICD-10-CM | POA: Diagnosis present

## 2021-06-13 NOTE — Therapy (Signed)
[facility] [address] ?[place], [place], [address] ?Phone: [phone]   Fax:  [phone] ? ?Physical Therapy Treatment ? ?Patient Details  ?Name: Michele Hanson ?MRN: [id number] ?Date of Birth: 1950-06-02 ?Referring Provider (PT): Dax Griffin Basil ? ? ?Encounter Date: 06/13/2021 ? ? PT End of Session - 06/13/21 1053   ? ? Visit Number 19   ? Date for PT Re-Evaluation 07/23/21   ? Authorization Time Period 04/17/21 to 06/12/21; extended to 4/11   ? Activity Tolerance Patient tolerated treatment well   ? Behavior During Therapy [facility] for tasks assessed/performed   ? ?  ?  ? ?  ? ? ?Past Medical History:  ?Diagnosis Date  ? Anxiety   ? Arthritis   ? Depression   ? Fatty liver disease, nonalcoholic   ? GERD (gastroesophageal reflux disease)   ? Hepatitis 1973  ? hep c-was treated  ? Hyperlipemia   ? Hypertension   ? ? ?Past Surgical History:  ?Procedure Laterality Date  ? BACK SURGERY    ? CARPAL TUNNEL RELEASE Bilateral   ? CHOLECYSTECTOMY    ? DIAGNOSTIC LAPAROSCOPY    ? INCONTINENCE SURGERY    ? LAPAROSCOPIC LYSIS OF ADHESIONS    ? REVERSE SHOULDER ARTHROPLASTY Right 03/20/2021  ? Procedure: REVERSE SHOULDER ARTHROPLASTY;  Surgeon: Hiram Gash, MD;  Location: [facility];  Service: Orthopedics;  Laterality: Right;  ? ? ?There were no vitals filed for this visit. ? ? Subjective Assessment - 06/13/21 1020   ? ? Subjective "Actually today I feel really good" No pain right now   ? Currently in Pain? No/denies   ? ?  ?  ? ?  ? ? ? ? ? ? ? ? ? ? ? ? ? ? ? ? ? ? ? ? [facility] Adult PT Treatment/Exercise - 06/13/21 0001   ? ?  ? Shoulder Exercises: Standing  ? External Rotation AROM;20 reps;Both   ? Flexion Strengthening;20 reps;Both;Weights   ? Shoulder Flexion Weight (lbs) 3   ? ABduction Strengthening;Both;20 reps;Weights   ? Shoulder ABduction Weight (lbs) 1   ? Extension Strengthening;Both;20 reps;Theraband   ? Theraband Level (Shoulder Extension) Level 3 (Green)    ? Other Standing Exercises RUE tricep Ext green 2x15   ? Other Standing Exercises Tricep Ext 20lb 2x10   ?  ? Shoulder Exercises: ROM/Strengthening  ? UBE (Upper Arm Bike) L1 x 3 min each   ?  ? Shoulder Exercises: Power Tower  ? Other Power Office manager & lats 2x10   ?  ? Manual Therapy  ? Manual Therapy Soft tissue mobilization   ? Passive ROM flex, ER, scaption and abd   ? ?  ?  ? ?  ? ? ? ? ? ? ? ? ? ? ? ? PT Short Term Goals - 06/11/21 1330   ? ?  ? PT SHORT TERM GOAL #1  ? Title Will be compliant with appropriate progressive HEP   ? Baseline 2/28- not compliant with daily HEP   ? Time 4   ? Period Weeks   ? Status Achieved   ?  ? PT SHORT TERM GOAL #2  ? Title R shoulder PROM and AAROM to be at least 120 degrees flexion and abduction, 45 degrees ER   ? Time 4   ? Period Weeks   ? Status Partially Met   ?  ? PT SHORT TERM GOAL #3  ? Title Pain  to be no more than 4/10 at worst   ? Baseline 2/28- met   ? Time 4   ? Period Weeks   ? Status Achieved   ?  ? PT SHORT TERM GOAL #4  ? Title Soft tissue restrictions in upper traps and biceps/anterior shoulder to be resolved in order to improve pain and ROM   ? Baseline 2/28- much improved, some restrictions still sticking around   ? Time 4   ? Period Weeks   ? Status Partially Met   ? ?  ?  ? ?  ? ? ? ? PT Long Term Goals - 06/13/21 1054   ? ?  ? PT LONG TERM GOAL #1  ? Title AROM R shoulder to be at least 150 degrees flexion and ABD without pain   ? ?  ?  ? ?  ? ? ? ? ? ? ? ? Plan - 06/13/21 1054   ? ? Clinical Impression Statement Pt continues to do well within the limitations of protocol. She was able to progress to machine level interventions under light load without issues. Some postural compensation noted with seated rows and lats due to ROM and strength deficits. Cue needed to relax with PROM.   ? Personal Factors and Comorbidities Past/Current Experience;Comorbidity 3+;Time since onset of injury/illness/exacerbation   ? Examination-Activity  Limitations Bathing;Bed Mobility;Reach Overhead;Self Feeding;Caring for Others;Sleep;Carry;Dressing;Hygiene/Grooming;Lift;Toileting   ? Examination-Participation Restrictions Meal Prep;Cleaning;Community Activity;Driving;Interpersonal Relationship;Shop;Laundry;Yard Work;Medication Management   ? Rehab Potential Good   ? PT Frequency 2x / week   ? PT Duration 6 weeks   ? PT Treatment/Interventions ADLs/Self Care Home Management;Biofeedback;Cryotherapy;Electrical Stimulation;Iontophoresis 4mg /ml Dexamethasone;Moist Heat;Ultrasound;Functional mobility training;Therapeutic activities;Therapeutic exercise;Patient/family education;Manual techniques;Scar mobilization;Passive range of motion;Taping;Vasopneumatic Device   ? PT Next Visit Plan needs more DN to RUE; continue to progress thru protocol. Update HEP. Week 12 is 3/1, progres protocol   ? ?  ?  ? ?  ? ? ?Patient will benefit from skilled therapeutic intervention in order to improve the following deficits and impairments:  Decreased range of motion, Increased fascial restricitons, Increased muscle spasms, Impaired UE functional use, Decreased activity tolerance, Decreased knowledge of precautions, Impaired perceived functional ability, Pain, Decreased scar mobility, Hypomobility, Improper body mechanics, Decreased strength, Postural dysfunction ? ?Visit Diagnosis: ?Muscle weakness (generalized) ? ?Localized edema ? ?Acute pain of right shoulder ? ?Abnormal posture ? ? ? ? ?Problem List ?Patient Active Problem List  ? Diagnosis Date Noted  ? De Quervain's tenosynovitis, right 07/22/2019  ? Abrasion of right knee 03/28/2019  ? Contusion of right chest wall 03/28/2019  ? Sprain of right wrist 03/28/2019  ? Bilateral carpal tunnel syndrome 03/17/2019  ? NAFLD (nonalcoholic fatty liver disease) 03/15/2019  ? Tendinitis of left rotator cuff 01/28/2019  ? Capsulitis of right shoulder 01/28/2019  ? Chronic right shoulder pain 01/21/2019  ? History of hepatitis C 01/21/2019   ? Joint pain in both hands 01/18/2019  ? Need for influenza vaccination 01/18/2019  ? Depression with anxiety 09/15/2018  ? Seasonal allergic rhinitis due to pollen 03/16/2018  ? B12 deficiency 07/16/2017  ? Neuropathy 07/15/2017  ? Spinal stenosis of lumbar region with neurogenic claudication 07/15/2017  ? Elevated cholesterol 07/15/2017  ? Healthcare maintenance 07/15/2017  ? Essential hypertension 07/15/2017  ? Abnormal CBC 07/15/2017  ? Monoclonal paraproteinemia 03/23/2012  ? ? ?Scot Jun, PTA ?06/13/2021, 10:56 AM ? ?[place] [facility] ?[place], [place], [address] ?Phone: [phone]   Fax:  [phone] ? ?  Name: Afreen Siebels ?MRN: [id number] ?Date of Birth: 1950-06-30 ? ? ? ?

## 2021-06-18 ENCOUNTER — Ambulatory Visit: Payer: Medicare Other | Admitting: Physical Therapy

## 2021-06-18 ENCOUNTER — Other Ambulatory Visit: Payer: Self-pay

## 2021-06-18 ENCOUNTER — Encounter: Payer: Self-pay | Admitting: Physical Therapy

## 2021-06-18 DIAGNOSIS — M25611 Stiffness of right shoulder, not elsewhere classified: Secondary | ICD-10-CM

## 2021-06-18 DIAGNOSIS — R600 Localized edema: Secondary | ICD-10-CM

## 2021-06-18 DIAGNOSIS — R293 Abnormal posture: Secondary | ICD-10-CM

## 2021-06-18 DIAGNOSIS — M25511 Pain in right shoulder: Secondary | ICD-10-CM

## 2021-06-18 DIAGNOSIS — M6281 Muscle weakness (generalized): Secondary | ICD-10-CM | POA: Diagnosis not present

## 2021-06-18 NOTE — Therapy (Signed)
[place]. [place], [place], [address] Phone: [phone]   Fax:  [phone]  Physical Therapy Treatment  Patient Details  Name: Michele Hanson MRN: [id number] Date of Birth: 1950-08-27 Referring Provider (PT): Ophelia Charter   Encounter Date: 06/18/2021   PT End of Session - 06/18/21 1357     Visit Number 20    Number of Visits 30    Date for PT Re-Evaluation 07/23/21    Authorization Type [facility] MCR    Progress Note Due on Visit 28    PT Start Time 1318    PT Stop Time 1357    PT Time Calculation (min) 39 min    Activity Tolerance Patient tolerated treatment well    Behavior During Therapy [facility] for tasks assessed/performed             Past Medical History:  Diagnosis Date   Anxiety    Arthritis    Depression    Fatty liver disease, nonalcoholic    GERD (gastroesophageal reflux disease)    Hepatitis 1973   hep c-was treated   Hyperlipemia    Hypertension     Past Surgical History:  Procedure Laterality Date   BACK SURGERY     CARPAL TUNNEL RELEASE Bilateral    CHOLECYSTECTOMY     DIAGNOSTIC LAPAROSCOPY     INCONTINENCE SURGERY     LAPAROSCOPIC LYSIS OF ADHESIONS     REVERSE SHOULDER ARTHROPLASTY Right 03/20/2021   Procedure: REVERSE SHOULDER ARTHROPLASTY;  Surgeon: Hiram Gash, MD;  Location: [facility];  Service: Orthopedics;  Laterality: Right;    There were no vitals filed for this visit.   Subjective Assessment - 06/18/21 1319     Subjective I saw the doctor yesterday, before that I'd tripped and caught myself on my dresser and it feels like I strained a muscle. It hurt very badly. The doctor said no limitations but maybe take it a bit easier today and let the shoulder recover a bit after this recent irritation.    Patient Stated Goals would like to get arm back to normal- as much as I can after the procedure    Currently in Pain? Yes    Pain Score 1     Pain Location  Shoulder    Pain Orientation Right    Pain Descriptors / Indicators Aching;Tender    Pain Type Acute pain                               [facility] Adult PT Treatment/Exercise - 06/18/21 0001       Shoulder Exercises: Supine   Flexion AROM;15 reps    Flexion Limitations 0#      Shoulder Exercises: Seated   Flexion Limitations attempted, increased pain and compensations    Other Seated Exercises thoracic 3D excursions 1x15 all directions    Other Seated Exercises UBE L1 3 min forward/3 min backward      Shoulder Exercises: Sidelying   External Rotation AROM;Right;10 reps      Shoulder Exercises: Standing   Internal Rotation AROM;15 reps    Internal Rotation Limitations with towel behind back   only able to get to mid-buttock   Extension AROM;Both;15 reps    Other Standing Exercises flexion ladder stretch 1x10 with 10 second holds  PT Education - 06/18/21 1357     Education Details modified session today to tolerance, aftercare and HEP hold off on band work at home until we can make sure her form is good again (compensating a lot today due to pain)    Person(s) Educated Patient    Methods Explanation    Comprehension Verbalized understanding              PT Short Term Goals - 06/11/21 1330       PT SHORT TERM GOAL #1   Title Will be compliant with appropriate progressive HEP    Baseline 2/28- not compliant with daily HEP    Time 4    Period Weeks    Status Achieved      PT SHORT TERM GOAL #2   Title R shoulder PROM and AAROM to be at least 120 degrees flexion and abduction, 45 degrees ER    Time 4    Period Weeks    Status Partially Met      PT SHORT TERM GOAL #3   Title Pain to be no more than 4/10 at worst    Baseline 2/28- met    Time 4    Period Weeks    Status Achieved      PT SHORT TERM GOAL #4   Title Soft tissue restrictions in upper traps and biceps/anterior shoulder to be resolved in order to  improve pain and ROM    Baseline 2/28- much improved, some restrictions still sticking around    Time 4    Period Weeks    Status Partially Met               PT Long Term Goals - 06/13/21 1054       PT LONG TERM GOAL #1   Title AROM R shoulder to be at least 150 degrees flexion and ABD without pain                   Plan - 06/18/21 1358     Clinical Impression Statement Michele Hanson arrives today doing OK but had a big backslide after tripping and catching herself with right shoulder on the dresser when she was trying to prevent a fall. Did see the doctor who cleared her for PT with no restrictions. Had a lot of tenderness and more compensation patterns than usual today, she is at 12 weeks so most of her restrictions are gone but we still opted to take it easy with AROM, light postural training, and general upper quarter and cervical/thoracic mobility work today. Hopefully she will be feeling better next session and able to return to more aggressive activities as per protocol next session.    Personal Factors and Comorbidities Past/Current Experience;Comorbidity 3+;Time since onset of injury/illness/exacerbation    Examination-Activity Limitations Bathing;Bed Mobility;Reach Overhead;Self Feeding;Caring for Others;Sleep;Carry;Dressing;Hygiene/Grooming;Lift;Toileting    Examination-Participation Restrictions Meal Prep;Cleaning;Community Activity;Driving;Interpersonal Relationship;Shop;Laundry;Yard Work;Medication Management    Stability/Clinical Decision Making Stable/Uncomplicated    Clinical Decision Making Low    Rehab Potential Good    PT Frequency 2x / week    PT Duration 6 weeks    PT Treatment/Interventions ADLs/Self Care Home Management;Biofeedback;Cryotherapy;Electrical Stimulation;Iontophoresis 103m/ml Dexamethasone;Moist Heat;Ultrasound;Functional mobility training;Therapeutic activities;Therapeutic exercise;Patient/family education;Manual techniques;Scar  mobilization;Passive range of motion;Taping;Vasopneumatic Device    PT Next Visit Plan needs more DN to RUE; continue to progress thru protocol. Update HEP. Week 12 is 3/1, progres protocol    PT Home Exercise Plan [id number]    Consulted and Agree with Plan of  Care Patient             Patient will benefit from skilled therapeutic intervention in order to improve the following deficits and impairments:  Decreased range of motion, Increased fascial restricitons, Increased muscle spasms, Impaired UE functional use, Decreased activity tolerance, Decreased knowledge of precautions, Impaired perceived functional ability, Pain, Decreased scar mobility, Hypomobility, Improper body mechanics, Decreased strength, Postural dysfunction  Visit Diagnosis: Muscle weakness (generalized)  Acute pain of right shoulder  Localized edema  Abnormal posture  Stiffness of right shoulder, not elsewhere classified     Problem List Patient Active Problem List   Diagnosis Date Noted   De Quervain's tenosynovitis, right 07/22/2019   Abrasion of right knee 03/28/2019   Contusion of right chest wall 03/28/2019   Sprain of right wrist 03/28/2019   Bilateral carpal tunnel syndrome 03/17/2019   NAFLD (nonalcoholic fatty liver disease) 03/15/2019   Tendinitis of left rotator cuff 01/28/2019   Capsulitis of right shoulder 01/28/2019   Chronic right shoulder pain 01/21/2019   History of hepatitis C 01/21/2019   Joint pain in both hands 01/18/2019   Need for influenza vaccination 01/18/2019   Depression with anxiety 09/15/2018   Seasonal allergic rhinitis due to pollen 03/16/2018   B12 deficiency 07/16/2017   Neuropathy 07/15/2017   Spinal stenosis of lumbar region with neurogenic claudication 07/15/2017   Elevated cholesterol 07/15/2017   Healthcare maintenance 07/15/2017   Essential hypertension 07/15/2017   Abnormal CBC 07/15/2017   Monoclonal paraproteinemia 03/23/2012   Ann Lions PT, DPT, PN2    Supplemental Physical Therapist [place]. [place], [place], [address] Phone: [phone]   Fax:  [phone]  Name: Michele Hanson MRN: [id number] Date of Birth: Sep 07, 1950

## 2021-06-19 ENCOUNTER — Ambulatory Visit: Payer: Medicare Other | Admitting: Physical Therapy

## 2021-06-19 ENCOUNTER — Encounter: Payer: Medicare Other | Admitting: Physical Therapy

## 2021-06-20 ENCOUNTER — Ambulatory Visit: Payer: Medicare Other | Admitting: Physical Therapy

## 2021-06-20 ENCOUNTER — Encounter: Payer: Self-pay | Admitting: Physical Therapy

## 2021-06-20 ENCOUNTER — Other Ambulatory Visit: Payer: Self-pay

## 2021-06-20 DIAGNOSIS — M6281 Muscle weakness (generalized): Secondary | ICD-10-CM | POA: Diagnosis not present

## 2021-06-20 DIAGNOSIS — M25511 Pain in right shoulder: Secondary | ICD-10-CM

## 2021-06-20 DIAGNOSIS — R293 Abnormal posture: Secondary | ICD-10-CM

## 2021-06-20 DIAGNOSIS — M25611 Stiffness of right shoulder, not elsewhere classified: Secondary | ICD-10-CM

## 2021-06-20 DIAGNOSIS — R600 Localized edema: Secondary | ICD-10-CM

## 2021-06-20 NOTE — Therapy (Signed)
[place]. [place], [place], [address] Phone: [phone]   Fax:  [phone]  Physical Therapy Treatment  Patient Details  Name: Michele Hanson MRN: [id number] Date of Birth: [birth year] Referring Provider (PT): Ophelia Charter   Encounter Date: 06/20/2021   PT End of Session - 06/20/21 1344     Visit Number 21    Authorization Time Period 04/17/21 to 06/12/21; extended to 4/11    Progress Note Due on Visit 28    PT Start Time 1300    PT Stop Time 1345    PT Time Calculation (min) 45 min    Activity Tolerance Patient tolerated treatment well    Behavior During Therapy [facility] for tasks assessed/performed             Past Medical History:  Diagnosis Date   Anxiety    Arthritis    Depression    Fatty liver disease, nonalcoholic    GERD (gastroesophageal reflux disease)    Hepatitis 1973   hep c-was treated   Hyperlipemia    Hypertension     Past Surgical History:  Procedure Laterality Date   BACK SURGERY     CARPAL TUNNEL RELEASE Bilateral    CHOLECYSTECTOMY     DIAGNOSTIC LAPAROSCOPY     INCONTINENCE SURGERY     LAPAROSCOPIC LYSIS OF ADHESIONS     REVERSE SHOULDER ARTHROPLASTY Right 03/20/2021   Procedure: REVERSE SHOULDER ARTHROPLASTY;  Surgeon: Hiram Gash, MD;  Location: [place];  Service: Orthopedics;  Laterality: Right;    There were no vitals filed for this visit.   Subjective Assessment - 06/20/21 1306     Subjective Pt enters clinic some brushing on her R arm in the medial biceps area that she noticed last night.    Currently in Pain? Yes    Pain Score 2     Pain Location Shoulder    Pain Orientation Right                               [facility] Adult PT Treatment/Exercise - 06/20/21 0001       Shoulder Exercises: Seated   Other Seated Exercises UBE L1 3 min forward/3 min backward      Shoulder Exercises: Standing   Internal Rotation  Strengthening;Right;10 reps;Theraband    Theraband Level (Shoulder Internal Rotation) Level 1 (Yellow)    Extension AROM;Both;15 reps    Theraband Level (Shoulder Extension) Level 1 (Yellow)    Row Strengthening;Both;20 reps;Theraband    Other Standing Exercises AAROM 1lb WaTE  flex, Ext, IR x10; behind the back ball passes x10 each      Manual Therapy   Manual Therapy Passive ROM    Passive ROM RUE all directions                       PT Short Term Goals - 06/11/21 1330       PT SHORT TERM GOAL #1   Title Will be compliant with appropriate progressive HEP    Baseline 2/28- not compliant with daily HEP    Time 4    Period Weeks    Status Achieved      PT SHORT TERM GOAL #2   Title R shoulder PROM and AAROM to be at least 120 degrees flexion and abduction, 45 degrees ER    Time 4    Period Weeks  Status Partially Met      PT SHORT TERM GOAL #3   Title Pain to be no more than 4/10 at worst    Baseline 2/28- met    Time 4    Period Weeks    Status Achieved      PT SHORT TERM GOAL #4   Title Soft tissue restrictions in upper traps and biceps/anterior shoulder to be resolved in order to improve pain and ROM    Baseline 2/28- much improved, some restrictions still sticking around    Time 4    Period Weeks    Status Partially Met               PT Long Term Goals - 06/13/21 1054       PT LONG TERM GOAL #1   Title AROM R shoulder to be at least 150 degrees flexion and ABD without pain                   Plan - 06/20/21 1345     Clinical Impression Statement Pt enters clinic with visual brushing on her R arm from catching herself on the dresser trying to prevent a fall. Added interventions that promoted RUE flexion and internal rotation. Some postural compensation noted with ext and IR under light load. RUE ROM is good overall with some end range pain.    Personal Factors and Comorbidities Past/Current Experience;Comorbidity 3+;Time since  onset of injury/illness/exacerbation    Examination-Activity Limitations Bathing;Bed Mobility;Reach Overhead;Self Feeding;Caring for Others;Sleep;Carry;Dressing;Hygiene/Grooming;Lift;Toileting    Examination-Participation Restrictions Meal Prep;Cleaning;Community Activity;Driving;Interpersonal Relationship;Shop;Laundry;Yard Work;Medication Management    Stability/Clinical Decision Making Stable/Uncomplicated    Rehab Potential Good    PT Frequency 2x / week    PT Duration 6 weeks    PT Treatment/Interventions ADLs/Self Care Home Management;Biofeedback;Cryotherapy;Electrical Stimulation;Iontophoresis 4mg /ml Dexamethasone;Moist Heat;Ultrasound;Functional mobility training;Therapeutic activities;Therapeutic exercise;Patient/family education;Manual techniques;Scar mobilization;Passive range of motion;Taping;Vasopneumatic Device    PT Next Visit Plan needs more DN to RUE; continue to progress thru protocol. Update HEP. Week 12 is 3/1, progres protocol             Patient will benefit from skilled therapeutic intervention in order to improve the following deficits and impairments:  Decreased range of motion, Increased fascial restricitons, Increased muscle spasms, Impaired UE functional use, Decreased activity tolerance, Decreased knowledge of precautions, Impaired perceived functional ability, Pain, Decreased scar mobility, Hypomobility, Improper body mechanics, Decreased strength, Postural dysfunction  Visit Diagnosis: Muscle weakness (generalized)  Acute pain of right shoulder  Localized edema  Abnormal posture  Stiffness of right shoulder, not elsewhere classified     Problem List Patient Active Problem List   Diagnosis Date Noted   De Quervain's tenosynovitis, right 07/22/2019   Abrasion of right knee 03/28/2019   Contusion of right chest wall 03/28/2019   Sprain of right wrist 03/28/2019   Bilateral carpal tunnel syndrome 03/17/2019   NAFLD (nonalcoholic fatty liver disease)  03/15/2019   Tendinitis of left rotator cuff 01/28/2019   Capsulitis of right shoulder 01/28/2019   Chronic right shoulder pain 01/21/2019   History of hepatitis C 01/21/2019   Joint pain in both hands 01/18/2019   Need for influenza vaccination 01/18/2019   Depression with anxiety 09/15/2018   Seasonal allergic rhinitis due to pollen 03/16/2018   B12 deficiency 07/16/2017   Neuropathy 07/15/2017   Spinal stenosis of lumbar region with neurogenic claudication 07/15/2017   Elevated cholesterol 07/15/2017   Healthcare maintenance 07/15/2017   Essential hypertension 07/15/2017   Abnormal CBC 07/15/2017  Monoclonal paraproteinemia 03/23/2012    Scot Jun, PTA 06/20/2021, 1:50 PM  [place]. [place], [place], [address] Phone: [phone]   Fax:  [phone]  Name: Jyl Chico MRN: [id number] Date of Birth: November 15, 1950

## 2021-06-22 ENCOUNTER — Other Ambulatory Visit: Payer: Self-pay | Admitting: Family Medicine

## 2021-06-22 DIAGNOSIS — M48062 Spinal stenosis, lumbar region with neurogenic claudication: Secondary | ICD-10-CM

## 2021-06-25 ENCOUNTER — Other Ambulatory Visit: Payer: Self-pay

## 2021-06-25 ENCOUNTER — Ambulatory Visit: Payer: Medicare Other | Admitting: Physical Therapy

## 2021-06-25 ENCOUNTER — Encounter: Payer: Self-pay | Admitting: Physical Therapy

## 2021-06-25 DIAGNOSIS — M6281 Muscle weakness (generalized): Secondary | ICD-10-CM

## 2021-06-25 DIAGNOSIS — M25611 Stiffness of right shoulder, not elsewhere classified: Secondary | ICD-10-CM

## 2021-06-25 DIAGNOSIS — M25511 Pain in right shoulder: Secondary | ICD-10-CM

## 2021-06-25 DIAGNOSIS — R293 Abnormal posture: Secondary | ICD-10-CM

## 2021-06-25 DIAGNOSIS — R600 Localized edema: Secondary | ICD-10-CM

## 2021-06-25 NOTE — Therapy (Signed)
[facility] ?[place] ?[place] ?[place], [place], [address] ?Phone: [phone]   Fax:  [phone] ? ?Physical Therapy Treatment ? ?Patient Details  ?Name: Michele Hanson ?MRN: [id number] ?Date of Birth: 07-13-1950 ?Referring Provider (PT): Dax Griffin Basil ? ? ?Encounter Date: 06/25/2021 ? ? PT End of Session - 06/25/21 1358   ? ? Visit Number 22   ? Number of Visits 30   ? Date for PT Re-Evaluation 07/23/21   ? Authorization Type [facility] MCR   ? Authorization Time Period 04/17/21 to 06/12/21; extended to 4/11   ? Progress Note Due on Visit 28   ? PT Start Time 1318   ? PT Stop Time 1027   ? PT Time Calculation (min) 39 min   ? Activity Tolerance Patient tolerated treatment well   ? Behavior During Therapy [facility] for tasks assessed/performed   ? ?  ?  ? ?  ? ? ?Past Medical History:  ?Diagnosis Date  ? Anxiety   ? Arthritis   ? Depression   ? Fatty liver disease, nonalcoholic   ? GERD (gastroesophageal reflux disease)   ? Hepatitis 1973  ? hep c-was treated  ? Hyperlipemia   ? Hypertension   ? ? ?Past Surgical History:  ?Procedure Laterality Date  ? BACK SURGERY    ? CARPAL TUNNEL RELEASE Bilateral   ? CHOLECYSTECTOMY    ? DIAGNOSTIC LAPAROSCOPY    ? INCONTINENCE SURGERY    ? LAPAROSCOPIC LYSIS OF ADHESIONS    ? REVERSE SHOULDER ARTHROPLASTY Right 03/20/2021  ? Procedure: REVERSE SHOULDER ARTHROPLASTY;  Surgeon: Hiram Gash, MD;  Location: [place] [place];  Service: Orthopedics;  Laterality: Right;  ? ? ?There were no vitals filed for this visit. ? ? Subjective Assessment - 06/25/21 1319   ? ? Subjective My pain is close to a zero right now, its feeling better.   ? Patient Stated Goals would like to get arm back to normal- as much as I can after the procedure   ? Currently in Pain? No/denies   ? ?  ?  ? ?  ? ? ? ? ? ? ? ? ? ? ? ? ? ? ? ? ? ? ? ? [facility] Adult PT Treatment/Exercise - 06/25/21 0001   ? ?  ? Shoulder Exercises: Seated  ? External Rotation  Strengthening;Right;10 reps   ? Theraband Level (Shoulder External Rotation) --   ? External Rotation Weight (lbs) 2   ? External Rotation Limitations in available ROM   ? Flexion Right;Theraband   ? Theraband Level (Shoulder Flexion) Level 1 (Yellow)   ? Flexion Limitations 2 sets   ? Abduction Strengthening;Right;10 reps;Theraband   ? Theraband Level (Shoulder ABduction) Level 1 (Yellow)   ? ABduction Limitations 2 sets   ? Other Seated Exercises UBE L2 3 min forward/3 min backward   ?  ? Shoulder Exercises: Standing  ? External Rotation Limitations ER stretch at doorframe 10x10 second holds   ? Flexion 10 reps   ? Flexion Limitations pillow case slides on the wall for ROM, 10 second holds   ? ABduction Right;10 reps   ? ABduction Limitations pillow case slides on the wall x10 second holds   ? Other Standing Exercises 2# wate bar for IR stretching 10x10 second holds to max tolerated ROM   ? Other Standing Exercises 2# wate bar shoulder extensions 1x10; yellow TB bicep curls 1x10 B; wall washes x10 CW and x10 CCW R UE; rainbow  wall washes 1x10 wall push ups limited ROM 1x10; table top shoulder extension stretches 1x10 iwth 2 second holds   ? ?  ?  ? ?  ? ? ? ? ? ? ? ? ? ? PT Education - 06/25/21 1357   ? ? Education Details frequent cues for exercise form and reducing compensations   ? Person(s) Educated Patient   ? Methods Explanation   ? Comprehension Verbalized understanding   ? ?  ?  ? ?  ? ? ? PT Short Term Goals - 06/11/21 1330   ? ?  ? PT SHORT TERM GOAL #1  ? Title Will be compliant with appropriate progressive HEP   ? Baseline 2/28- not compliant with daily HEP   ? Time 4   ? Period Weeks   ? Status Achieved   ?  ? PT SHORT TERM GOAL #2  ? Title R shoulder PROM and AAROM to be at least 120 degrees flexion and abduction, 45 degrees ER   ? Time 4   ? Period Weeks   ? Status Partially Met   ?  ? PT SHORT TERM GOAL #3  ? Title Pain to be no more than 4/10 at worst   ? Baseline 2/28- met   ? Time 4   ? Period  Weeks   ? Status Achieved   ?  ? PT SHORT TERM GOAL #4  ? Title Soft tissue restrictions in upper traps and biceps/anterior shoulder to be resolved in order to improve pain and ROM   ? Baseline 2/28- much improved, some restrictions still sticking around   ? Time 4   ? Period Weeks   ? Status Partially Met   ? ?  ?  ? ?  ? ? ? ? PT Long Term Goals - 06/13/21 1054   ? ?  ? PT LONG TERM GOAL #1  ? Title AROM R shoulder to be at least 150 degrees flexion and ABD without pain   ? ?  ?  ? ?  ? ? ? ? ? ? ? ? Plan - 06/25/21 1358   ? ? Clinical Impression Statement [name] arrives feeling better, still has some bruising on her shoulder but feels like we can push it more today. We focused on therband strengthening today, she is very weak and she tends to compensate quite a lot with movements. Really needed a lot of cues and encouragement for good form including for appropriate lengths of recovery time and for avoiding multiple compensations- tends to arch, twist, and lean at thoracic spine when challenged strength wise.   ? Personal Factors and Comorbidities Past/Current Experience;Comorbidity 3+;Time since onset of injury/illness/exacerbation   ? Examination-Activity Limitations Bathing;Bed Mobility;Reach Overhead;Self Feeding;Caring for Others;Sleep;Carry;Dressing;Hygiene/Grooming;Lift;Toileting   ? Examination-Participation Restrictions Meal Prep;Cleaning;Community Activity;Driving;Interpersonal Relationship;Shop;Laundry;Yard Work;Medication Management   ? Stability/Clinical Decision Making Stable/Uncomplicated   ? Clinical Decision Making Low   ? Rehab Potential Good   ? PT Frequency 2x / week   ? PT Duration 6 weeks   ? PT Treatment/Interventions ADLs/Self Care Home Management;Biofeedback;Cryotherapy;Electrical Stimulation;Iontophoresis 52m/ml Dexamethasone;Moist Heat;Ultrasound;Functional mobility training;Therapeutic activities;Therapeutic exercise;Patient/family education;Manual techniques;Scar mobilization;Passive  range of motion;Taping;Vasopneumatic Device   ? PT Next Visit Plan needs more DN to RUE; continue to progress thru protocol. Update HEP. Week 12 is 3/1, progres protocol   ? PT Home Exercise Plan [id number]  ? Consulted and Agree with Plan of Care Patient   ? ?  ?  ? ?  ? ? ?Patient will benefit  from skilled therapeutic intervention in order to improve the following deficits and impairments:  Decreased range of motion, Increased fascial restricitons, Increased muscle spasms, Impaired UE functional use, Decreased activity tolerance, Decreased knowledge of precautions, Impaired perceived functional ability, Pain, Decreased scar mobility, Hypomobility, Improper body mechanics, Decreased strength, Postural dysfunction ? ?Visit Diagnosis: ?Muscle weakness (generalized) ? ?Acute pain of right shoulder ? ?Localized edema ? ?Abnormal posture ? ?Stiffness of right shoulder, not elsewhere classified ? ? ? ? ?Problem List ?Patient Active Problem List  ? Diagnosis Date Noted  ? De Quervain's tenosynovitis, right 07/22/2019  ? Abrasion of right knee 03/28/2019  ? Contusion of right chest wall 03/28/2019  ? Sprain of right wrist 03/28/2019  ? Bilateral carpal tunnel syndrome 03/17/2019  ? NAFLD (nonalcoholic fatty liver disease) 03/15/2019  ? Tendinitis of left rotator cuff 01/28/2019  ? Capsulitis of right shoulder 01/28/2019  ? Chronic right shoulder pain 01/21/2019  ? History of hepatitis C 01/21/2019  ? Joint pain in both hands 01/18/2019  ? Need for influenza vaccination 01/18/2019  ? Depression with anxiety 09/15/2018  ? Seasonal allergic rhinitis due to pollen 03/16/2018  ? B12 deficiency 07/16/2017  ? Neuropathy 07/15/2017  ? Spinal stenosis of lumbar region with neurogenic claudication 07/15/2017  ? Elevated cholesterol 07/15/2017  ? Healthcare maintenance 07/15/2017  ? Essential hypertension 07/15/2017  ? Abnormal CBC 07/15/2017  ? Monoclonal paraproteinemia 03/23/2012  ? ?Harly Pipkins U PT, DPT, PN2  ? ?Supplemental Physical  Therapist ?[facility]  ? ? ? ? ? ?[place] ?[place] ?[address] ?[place], [place], [address] ?Phone: [phone]   Fax:  [phone] ? ?Name: Onie Kasparek ?MRN: [id number]

## 2021-06-26 ENCOUNTER — Ambulatory Visit: Payer: Medicare Other | Admitting: Family Medicine

## 2021-06-26 ENCOUNTER — Encounter: Payer: Self-pay | Admitting: Physical Therapy

## 2021-06-26 ENCOUNTER — Ambulatory Visit: Payer: Medicare Other | Admitting: Physical Therapy

## 2021-06-26 DIAGNOSIS — M25511 Pain in right shoulder: Secondary | ICD-10-CM

## 2021-06-26 DIAGNOSIS — R293 Abnormal posture: Secondary | ICD-10-CM

## 2021-06-26 DIAGNOSIS — M25611 Stiffness of right shoulder, not elsewhere classified: Secondary | ICD-10-CM

## 2021-06-26 DIAGNOSIS — M6281 Muscle weakness (generalized): Secondary | ICD-10-CM

## 2021-06-26 DIAGNOSIS — R600 Localized edema: Secondary | ICD-10-CM

## 2021-06-26 NOTE — Therapy (Signed)
[facility] ?[place] ?[address] ?[place], [place], [address] ?Phone: [phone]   Fax:  [phone] ? ?Physical Therapy Treatment ? ?Patient Details  ?Name: Michele Hanson ?MRN: [id number] ?Date of Birth: 1950-07-15 ?Referring Provider (PT): Dax Griffin Basil ? ? ?Encounter Date: 06/26/2021 ? ? PT End of Session - 06/26/21 1422   ? ? Visit Number 23   ? Date for PT Re-Evaluation 07/23/21   ? PT Start Time 1345   ? PT Stop Time 1428   ? PT Time Calculation (min) 43 min   ? Activity Tolerance Patient tolerated treatment well   ? Behavior During Therapy [facility] for tasks assessed/performed   ? ?  ?  ? ?  ? ? ?Past Medical History:  ?Diagnosis Date  ? Anxiety   ? Arthritis   ? Depression   ? Fatty liver disease, nonalcoholic   ? GERD (gastroesophageal reflux disease)   ? Hepatitis 1973  ? hep c-was treated  ? Hyperlipemia   ? Hypertension   ? ? ?Past Surgical History:  ?Procedure Laterality Date  ? BACK SURGERY    ? CARPAL TUNNEL RELEASE Bilateral   ? CHOLECYSTECTOMY    ? DIAGNOSTIC LAPAROSCOPY    ? INCONTINENCE SURGERY    ? LAPAROSCOPIC LYSIS OF ADHESIONS    ? REVERSE SHOULDER ARTHROPLASTY Right 03/20/2021  ? Procedure: REVERSE SHOULDER ARTHROPLASTY;  Surgeon: Hiram Gash, MD;  Location: [facility];  Service: Orthopedics;  Laterality: Right;  ? ? ?There were no vitals filed for this visit. ? ? Subjective Assessment - 06/26/21 1348   ? ? Subjective Doing ok just a little sore from yesterday.   ? Currently in Pain? No/denies   ? ?  ?  ? ?  ? ? ? ? ? ? ? ? ? ? ? ? ? ? ? ? ? ? ? ? [facility] Adult PT Treatment/Exercise - 06/26/21 0001   ? ?  ? Shoulder Exercises: Standing  ? Horizontal ABduction Strengthening;Both;20 reps;Theraband   ? Theraband Level (Shoulder Horizontal ABduction) Level 1 (Yellow)   ? Flexion Strengthening;Both;20 reps;Theraband   ? Shoulder Flexion Weight (lbs) 2   ? ABduction Strengthening;Both;20 reps   ? Shoulder ABduction Weight (lbs) 1   ? Extension  Strengthening;Both;20 reps   ? Extension Weight (lbs) 5   ? Other Standing Exercises 3 level cabinet reaches   ? Other Standing Exercises Tricep Ext 20lb 2x10   ?  ? Shoulder Exercises: ROM/Strengthening  ? UBE (Upper Arm Bike) L2.1 x 3 min each   ?  ? Shoulder Exercises: Power Tower  ? Other Power Office manager & lats 2x10   ? ?  ?  ? ?  ? ? ? ? ? ? ? ? ? ? ? ? PT Short Term Goals - 06/11/21 1330   ? ?  ? PT SHORT TERM GOAL #1  ? Title Will be compliant with appropriate progressive HEP   ? Baseline 2/28- not compliant with daily HEP   ? Time 4   ? Period Weeks   ? Status Achieved   ?  ? PT SHORT TERM GOAL #2  ? Title R shoulder PROM and AAROM to be at least 120 degrees flexion and abduction, 45 degrees ER   ? Time 4   ? Period Weeks   ? Status Partially Met   ?  ? PT SHORT TERM GOAL #3  ? Title Pain to be no more than 4/10 at worst   ?  Baseline 2/28- met   ? Time 4   ? Period Weeks   ? Status Achieved   ?  ? PT SHORT TERM GOAL #4  ? Title Soft tissue restrictions in upper traps and biceps/anterior shoulder to be resolved in order to improve pain and ROM   ? Baseline 2/28- much improved, some restrictions still sticking around   ? Time 4   ? Period Weeks   ? Status Partially Met   ? ?  ?  ? ?  ? ? ? ? PT Long Term Goals - 06/13/21 1054   ? ?  ? PT LONG TERM GOAL #1  ? Title AROM R shoulder to be at least 150 degrees flexion and ABD without pain   ? ?  ?  ? ?  ? ? ? ? ? ? ? ? Plan - 06/26/21 1423   ? ? Clinical Impression Statement Soreness reported form yesterday session. Continued with L shoulder strengthening interventions. R shoulder elevation present with standing flexion and abduction. RUE did fatigue quick with horizontal abduction PT has dome difficult reaching the highest level in cabinet.   ? Personal Factors and Comorbidities Past/Current Experience;Comorbidity 3+;Time since onset of injury/illness/exacerbation   ? Examination-Activity Limitations Bathing;Bed Mobility;Reach Overhead;Self  Feeding;Caring for Others;Sleep;Carry;Dressing;Hygiene/Grooming;Lift;Toileting   ? Examination-Participation Restrictions Meal Prep;Cleaning;Community Activity;Driving;Interpersonal Relationship;Shop;Laundry;Yard Work;Medication Management   ? Stability/Clinical Decision Making Stable/Uncomplicated   ? Rehab Potential Good   ? PT Frequency 2x / week   ? PT Treatment/Interventions ADLs/Self Care Home Management;Biofeedback;Cryotherapy;Electrical Stimulation;Iontophoresis 4mg /ml Dexamethasone;Moist Heat;Ultrasound;Functional mobility training;Therapeutic activities;Therapeutic exercise;Patient/family education;Manual techniques;Scar mobilization;Passive range of motion;Taping;Vasopneumatic Device   ? PT Next Visit Plan needs more DN to RUE; continue to progress thru protocol. Update HEP. Week 12 is 3/1, progres protocol   ? ?  ?  ? ?  ? ? ?Patient will benefit from skilled therapeutic intervention in order to improve the following deficits and impairments:  Decreased range of motion, Increased fascial restricitons, Increased muscle spasms, Impaired UE functional use, Decreased activity tolerance, Decreased knowledge of precautions, Impaired perceived functional ability, Pain, Decreased scar mobility, Hypomobility, Improper body mechanics, Decreased strength, Postural dysfunction ? ?Visit Diagnosis: ?Muscle weakness (generalized) ? ?Acute pain of right shoulder ? ?Localized edema ? ?Stiffness of right shoulder, not elsewhere classified ? ?Abnormal posture ? ? ? ? ?Problem List ?Patient Active Problem List  ? Diagnosis Date Noted  ? De Quervain's tenosynovitis, right 07/22/2019  ? Abrasion of right knee 03/28/2019  ? Contusion of right chest wall 03/28/2019  ? Sprain of right wrist 03/28/2019  ? Bilateral carpal tunnel syndrome 03/17/2019  ? NAFLD (nonalcoholic fatty liver disease) 03/15/2019  ? Tendinitis of left rotator cuff 01/28/2019  ? Capsulitis of right shoulder 01/28/2019  ? Chronic right shoulder pain  01/21/2019  ? History of hepatitis C 01/21/2019  ? Joint pain in both hands 01/18/2019  ? Need for influenza vaccination 01/18/2019  ? Depression with anxiety 09/15/2018  ? Seasonal allergic rhinitis due to pollen 03/16/2018  ? B12 deficiency 07/16/2017  ? Neuropathy 07/15/2017  ? Spinal stenosis of lumbar region with neurogenic claudication 07/15/2017  ? Elevated cholesterol 07/15/2017  ? Healthcare maintenance 07/15/2017  ? Essential hypertension 07/15/2017  ? Abnormal CBC 07/15/2017  ? Monoclonal paraproteinemia 03/23/2012  ? ? ?Scot Jun, PTA ?06/26/2021, 2:26 PM ? ?[facility] ?[place] ?[address] ?[place], [place], [address] ?Phone: [phone]   Fax:  [phone] ? ?Name: Fairy Ashlock ?MRN: [id number] ?Date of Birth: [birth year] ? ? ? ?

## 2021-06-27 ENCOUNTER — Ambulatory Visit: Payer: Medicare Other | Admitting: Physical Therapy

## 2021-07-01 ENCOUNTER — Other Ambulatory Visit: Payer: Self-pay | Admitting: Family Medicine

## 2021-07-01 DIAGNOSIS — M25542 Pain in joints of left hand: Secondary | ICD-10-CM

## 2021-07-01 DIAGNOSIS — G8929 Other chronic pain: Secondary | ICD-10-CM

## 2021-07-02 ENCOUNTER — Encounter: Payer: Self-pay | Admitting: Physical Therapy

## 2021-07-02 ENCOUNTER — Other Ambulatory Visit: Payer: Self-pay

## 2021-07-02 ENCOUNTER — Ambulatory Visit: Payer: Medicare Other | Admitting: Physical Therapy

## 2021-07-02 ENCOUNTER — Encounter: Payer: Self-pay | Admitting: Family Medicine

## 2021-07-02 ENCOUNTER — Ambulatory Visit (INDEPENDENT_AMBULATORY_CARE_PROVIDER_SITE_OTHER): Payer: Medicare Other | Admitting: Family Medicine

## 2021-07-02 VITALS — BP 120/78 | HR 75 | Temp 96.9°F | Ht 66.0 in | Wt 167.2 lb

## 2021-07-02 DIAGNOSIS — K760 Fatty (change of) liver, not elsewhere classified: Secondary | ICD-10-CM

## 2021-07-02 DIAGNOSIS — E7800 Pure hypercholesterolemia, unspecified: Secondary | ICD-10-CM | POA: Diagnosis not present

## 2021-07-02 DIAGNOSIS — I10 Essential (primary) hypertension: Secondary | ICD-10-CM | POA: Diagnosis not present

## 2021-07-02 DIAGNOSIS — E559 Vitamin D deficiency, unspecified: Secondary | ICD-10-CM

## 2021-07-02 DIAGNOSIS — R293 Abnormal posture: Secondary | ICD-10-CM

## 2021-07-02 DIAGNOSIS — E538 Deficiency of other specified B group vitamins: Secondary | ICD-10-CM | POA: Diagnosis not present

## 2021-07-02 DIAGNOSIS — Z8619 Personal history of other infectious and parasitic diseases: Secondary | ICD-10-CM

## 2021-07-02 DIAGNOSIS — M25511 Pain in right shoulder: Secondary | ICD-10-CM

## 2021-07-02 DIAGNOSIS — M25611 Stiffness of right shoulder, not elsewhere classified: Secondary | ICD-10-CM

## 2021-07-02 DIAGNOSIS — M6281 Muscle weakness (generalized): Secondary | ICD-10-CM

## 2021-07-02 DIAGNOSIS — Z789 Other specified health status: Secondary | ICD-10-CM | POA: Diagnosis not present

## 2021-07-02 DIAGNOSIS — F1090 Alcohol use, unspecified, uncomplicated: Secondary | ICD-10-CM | POA: Insufficient documentation

## 2021-07-02 DIAGNOSIS — R600 Localized edema: Secondary | ICD-10-CM

## 2021-07-02 LAB — CBC WITH DIFFERENTIAL/PLATELET
Basophils Absolute: 0 10*3/uL (ref 0.0–0.1)
Basophils Relative: 0.9 % (ref 0.0–3.0)
Eosinophils Absolute: 0.1 10*3/uL (ref 0.0–0.7)
Eosinophils Relative: 1.7 % (ref 0.0–5.0)
HCT: 38.9 % (ref 36.0–46.0)
Hemoglobin: 13.1 g/dL (ref 12.0–15.0)
Lymphocytes Relative: 24.4 % (ref 12.0–46.0)
Lymphs Abs: 1.1 10*3/uL (ref 0.7–4.0)
MCHC: 33.6 g/dL (ref 30.0–36.0)
MCV: 90.7 fl (ref 78.0–100.0)
Monocytes Absolute: 0.4 10*3/uL (ref 0.1–1.0)
Monocytes Relative: 7.6 % (ref 3.0–12.0)
Neutro Abs: 3.1 10*3/uL (ref 1.4–7.7)
Neutrophils Relative %: 65.4 % (ref 43.0–77.0)
Platelets: 171 10*3/uL (ref 150.0–400.0)
RBC: 4.29 Mil/uL (ref 3.87–5.11)
RDW: 13.3 % (ref 11.5–15.5)
WBC: 4.7 10*3/uL (ref 4.0–10.5)

## 2021-07-02 LAB — BASIC METABOLIC PANEL
BUN: 16 mg/dL (ref 6–23)
CO2: 25 mEq/L (ref 19–32)
Calcium: 9.7 mg/dL (ref 8.4–10.5)
Chloride: 102 mEq/L (ref 96–112)
Creatinine, Ser: 0.77 mg/dL (ref 0.40–1.20)
GFR: 78.16 mL/min (ref >60.00)
Glucose, Bld: 101 mg/dL — ABNORMAL HIGH (ref 70–99)
Potassium: 4.1 mEq/L (ref 3.5–5.1)
Sodium: 137 mEq/L (ref 135–145)

## 2021-07-02 LAB — HEPATIC FUNCTION PANEL
ALT: 14 U/L (ref 0–35)
AST: 14 U/L (ref 0–37)
Albumin: 4.4 g/dL (ref 3.5–5.2)
Alkaline Phosphatase: 60 U/L (ref 39–117)
Bilirubin, Direct: 0.1 mg/dL (ref 0.0–0.3)
Total Bilirubin: 0.5 mg/dL (ref 0.2–1.2)
Total Protein: 6.6 g/dL (ref 6.0–8.3)

## 2021-07-02 LAB — URINALYSIS, ROUTINE W REFLEX MICROSCOPIC
Bilirubin Urine: NEGATIVE
Hgb urine dipstick: NEGATIVE
Ketones, ur: NEGATIVE
Nitrite: NEGATIVE
RBC / HPF: NONE SEEN (ref 0–?)
Specific Gravity, Urine: 1.02 (ref 1.000–1.030)
Total Protein, Urine: NEGATIVE
Urine Glucose: NEGATIVE
Urobilinogen, UA: 0.2 (ref 0.0–1.0)
pH: 5.5 (ref 5.0–8.0)

## 2021-07-02 LAB — LIPID PANEL
Cholesterol: 187 mg/dL (ref 0–200)
HDL: 82.3 mg/dL (ref >39.00)
LDL Cholesterol: 87 mg/dL (ref 0–99)
NonHDL: 105.18
Total CHOL/HDL Ratio: 2
Triglycerides: 91 mg/dL (ref 0.0–149.0)
VLDL: 18.2 mg/dL (ref 0.0–40.0)

## 2021-07-02 LAB — VITAMIN D 25 HYDROXY (VIT D DEFICIENCY, FRACTURES): VITD: 43.78 ng/mL (ref 30.00–100.00)

## 2021-07-02 LAB — VITAMIN B12: Vitamin B-12: 302 pg/mL (ref 211–911)

## 2021-07-02 NOTE — Progress Notes (Signed)
? ?Established Patient Office Visit ? ?Subjective:  ?Patient ID: Michele Hanson, female    DOB: July 20, 1950  Age: 71 y.o. MRN: [id number] ? ?CC:  ?Chief Complaint  ?Patient presents with  ? Follow-up  ?  6 month follow on labs. Pt c/o pain with both feet mostly at night. Bruse on rt arm.  ? ? ?HPI ?Michele Hanson presents for follow-up of hypertension, elevated cholesterol, B12 deficiency, hepatic steatosis with history of hep C in remission and alcohol usage.  She is a daily drinker of alcohol and has been interested in cutting back. ? ?Past Medical History:  ?Diagnosis Date  ? Anxiety   ? Arthritis   ? Depression   ? Fatty liver disease, nonalcoholic   ? GERD (gastroesophageal reflux disease)   ? Hepatitis 1973  ? hep c-was treated  ? Hyperlipemia   ? Hypertension   ? ? ?Past Surgical History:  ?Procedure Laterality Date  ? BACK SURGERY    ? CARPAL TUNNEL RELEASE Bilateral   ? CHOLECYSTECTOMY    ? DIAGNOSTIC LAPAROSCOPY    ? INCONTINENCE SURGERY    ? LAPAROSCOPIC LYSIS OF ADHESIONS    ? REVERSE SHOULDER ARTHROPLASTY Right 03/20/2021  ? Procedure: REVERSE SHOULDER ARTHROPLASTY;  Surgeon: Hiram Gash, MD;  Location: [facility];  Service: Orthopedics;  Laterality: Right;  ? ? ?Family History  ?Family history unknown: Yes  ? ? ?Social History  ? ?Socioeconomic History  ? Marital status: Married  ?  Spouse name: Not on file  ? Number of children: Not on file  ? Years of education: Not on file  ? Highest education level: Not on file  ?Occupational History  ? Not on file  ?Tobacco Use  ? Smoking status: Never  ? Smokeless tobacco: Never  ?Vaping Use  ? Vaping Use: Never used  ?Substance and Sexual Activity  ? Alcohol use: Yes  ?  Comment: 1-2 alcoholic drinks daily  ? Drug use: Never  ? Sexual activity: Yes  ?  Birth control/protection: Post-menopausal  ?Other Topics Concern  ? Not on file  ?Social History Narrative  ? Not on file  ? ?Social Determinants of Health  ? ?Financial Resource Strain: Not on file   ?Food Insecurity: Not on file  ?Transportation Needs: Not on file  ?Physical Activity: Not on file  ?Stress: Not on file  ?Social Connections: Not on file  ?Intimate Partner Violence: Not on file  ? ? ?Outpatient Medications Prior to Visit  ?Medication Sig Dispense Refill  ? atorvastatin (LIPITOR) 20 MG tablet TAKE ONE TABLET BY MOUTH ONE TIME DAILY 90 tablet 3  ? azelastine (ASTELIN) 0.1 % nasal spray USE ONE SPRAY INTO BOTH NOSTRILS AS NEEDED 30 mL 3  ? denosumab (PROLIA) 60 MG/ML SOSY injection Inject 60 mg into the skin every 6 (six) months.    ? estradiol (ESTRACE) 0.1 MG/GM vaginal cream     ? fluticasone (FLONASE) 50 MCG/ACT nasal spray USE ONE SPRAY IN EACH NOSTRIL DAILY **SHAKE BEFORE USING** 16 mL 4  ? gabapentin (NEURONTIN) 300 MG capsule TAKE ONE CAPSULE BY MOUTH TWICE A DAY 180 capsule 1  ? lisinopril-hydrochlorothiazide (ZESTORETIC) 20-12.5 MG tablet TAKE ONE TABLET BY MOUTH ONE TIME DAILY 90 tablet 2  ? meloxicam (MOBIC) 15 MG tablet Take 1 tablet (15 mg total) by mouth daily. For 2 weeks for pain and inflammation. Then take as needed 30 tablet 0  ? methocarbamol (ROBAXIN) 500 MG tablet TAKE ONE TABLET BY MOUTH EVERY 8 HOURS  AS NEEDED FOR MUSCLE SPASMS 60 tablet 0  ? omeprazole (PRILOSEC) 20 MG capsule TAKE ONE CAPSULE BY MOUTH ONE TIME DAILY 90 capsule 0  ? Vitamin D, Ergocalciferol, (DRISDOL) 1.25 MG (50000 UNIT) CAPS capsule Take 50,000 Units by mouth every 7 (seven) days.    ? traMADol (ULTRAM) 50 MG tablet May take one twice daily as needed for sever pain. 30 tablet 0  ? citalopram (CELEXA) 20 MG tablet TAKE ONE TABLET BY MOUTH ONE TIME DAILY 90 tablet 1  ? clobetasol (TEMOVATE) 0.05 % GEL Apply 1 application topically 2 (two) times daily. 15 g 0  ? ?No facility-administered medications prior to visit.  ? ? ?Allergies  ?Allergen Reactions  ? Ciprofloxacin Other (See Comments)  ?  Neuropathy in feet  ? Neomycin Itching  ?  Eye drops  ? Nickel Itching and Rash  ? ? ?ROS ?Review of Systems   ?Constitutional:  Negative for diaphoresis, fatigue, fever and unexpected weight change.  ?HENT: Negative.    ?Eyes:  Negative for photophobia and visual disturbance.  ?Respiratory: Negative.    ?Cardiovascular: Negative.   ?Gastrointestinal: Negative.   ?Endocrine: Negative for polyphagia and polyuria.  ?Genitourinary: Negative.   ?Musculoskeletal:  Positive for arthralgias.  ?Neurological:  Negative for speech difficulty and weakness.  ? ?  ?Objective:  ?  ?Physical Exam ?Constitutional:   ?   General: She is not in acute distress. ?   Appearance: Normal appearance. She is not ill-appearing, toxic-appearing or diaphoretic.  ?HENT:  ?   Head: Normocephalic and atraumatic.  ?   Right Ear: External ear normal.  ?   Left Ear: External ear normal.  ?   Mouth/Throat:  ?   Mouth: Mucous membranes are moist.  ?   Pharynx: Oropharynx is clear. No oropharyngeal exudate or posterior oropharyngeal erythema.  ?Eyes:  ?   General: No scleral icterus.    ?   Right eye: No discharge.     ?   Left eye: No discharge.  ?   Extraocular Movements: Extraocular movements intact.  ?   Conjunctiva/sclera: Conjunctivae normal.  ?Cardiovascular:  ?   Rate and Rhythm: Normal rate and regular rhythm.  ?Pulmonary:  ?   Effort: Pulmonary effort is normal.  ?   Breath sounds: Normal breath sounds.  ?Musculoskeletal:  ?   Cervical back: No rigidity or tenderness.  ?Lymphadenopathy:  ?   Cervical: No cervical adenopathy.  ?Skin: ?   General: Skin is warm and dry.  ?Neurological:  ?   Mental Status: She is alert and oriented to person, place, and time.  ?Psychiatric:     ?   Mood and Affect: Mood normal.     ?   Behavior: Behavior normal.  ? ? ?BP 120/78 (BP Location: Left Arm, Patient Position: Sitting, Cuff Size: Normal)   Pulse 75   Temp (!) 96.9 ?F (36.1 ?C) (Temporal)   Ht '5\' 6"'$  (1.676 m)   Wt 167 lb 3.2 oz (75.8 kg)   SpO2 95%   BMI 26.99 kg/m?  ?Wt Readings from Last 3 Encounters:  ?07/02/21 167 lb 3.2 oz (75.8 kg)  ?03/20/21 162 lb  11.2 oz (73.8 kg)  ?03/14/21 161 lb 6.4 oz (73.2 kg)  ? ? ? ?Health Maintenance Due  ?Topic Date Due  ? Pneumonia Vaccine 48+ Years old (1 - PCV) Never done  ? ? ?There are no preventive care reminders to display for this patient. ? ?Lab Results  ?Component Value Date  ?  TSH 1.37 07/15/2017  ? ?Lab Results  ?Component Value Date  ? WBC 6.3 12/27/2020  ? HGB 13.9 12/27/2020  ? HCT 41.6 12/27/2020  ? MCV 94.4 12/27/2020  ? PLT 201.0 12/27/2020  ? ?Lab Results  ?Component Value Date  ? NA 139 03/14/2021  ? K 4.5 03/14/2021  ? CHLORIDE 104 08/09/2013  ? CO2 26 03/14/2021  ? GLUCOSE 107 (H) 03/14/2021  ? BUN 19 03/14/2021  ? CREATININE 0.94 03/14/2021  ? BILITOT 0.5 12/27/2020  ? ALKPHOS 58 12/27/2020  ? AST 15 12/27/2020  ? ALT 15 12/27/2020  ? PROT 6.8 12/27/2020  ? ALBUMIN 4.4 12/27/2020  ? CALCIUM 9.6 03/14/2021  ? ANIONGAP 11 03/14/2021  ? GFR 66.83 12/27/2020  ? ?Lab Results  ?Component Value Date  ? CHOL 220 (H) 12/27/2020  ? ?Lab Results  ?Component Value Date  ? HDL 88.80 12/27/2020  ? ?Lab Results  ?Component Value Date  ? LDLCALC 115 (H) 12/27/2020  ? ?Lab Results  ?Component Value Date  ? TRIG 82.0 12/27/2020  ? ?Lab Results  ?Component Value Date  ? CHOLHDL 2 12/27/2020  ? ?No results found for: HGBA1C ? ?  ?Assessment & Plan:  ? ?Problem List Items Addressed This Visit   ? ?  ? Cardiovascular and Mediastinum  ? Essential hypertension - Primary  ? Relevant Orders  ? Urinalysis  ? CBC with Differential/Platelet  ? Basic metabolic panel  ?  ? Digestive  ? History of hepatitis C  ? Hepatic steatosis  ? Relevant Orders  ? CBC with Differential/Platelet  ? Hepatic function panel  ?  ? Other  ? Elevated cholesterol  ? Relevant Orders  ? Lipid panel  ? B12 deficiency  ? Relevant Orders  ? Vitamin B12  ? Alcohol use  ? Relevant Orders  ? Hepatic function panel  ? Ethyl Glucuronide, Urine  ? ?Other Visit Diagnoses   ? ? Vitamin D deficiency      ? Relevant Orders  ? VITAMIN D 25 Hydroxy (Vit-D Deficiency, Fractures)   ? ?  ? ? ?No orders of the defined types were placed in this encounter. ? ? ?Follow-up: Return in about 6 months (around 01/02/2022), or if symptoms worsen or fail to improve.  ?Expressed my concern regarding her a

## 2021-07-02 NOTE — Therapy (Signed)
[facility] ?[address] ?[address] ?[place], [place], [address] ?Phone: [phone]   Fax:  [phone] ? ?Physical Therapy Treatment ? ?Patient Details  ?Name: Michele Hanson ?MRN: [id number] ?Date of Birth: Sep 10, 1950 ?Referring Provider (PT): Dax Griffin Basil ? ? ?Encounter Date: 07/02/2021 ? ? PT End of Session - 07/02/21 1358   ? ? Visit Number 24   ? Number of Visits 30   ? Date for PT Re-Evaluation 07/23/21   ? Authorization Type [facility] MCR   ? Authorization Time Period 04/17/21 to 06/12/21; extended to 4/11   ? Progress Note Due on Visit 28   ? PT Start Time 6440   ? PT Stop Time 3474   ? PT Time Calculation (min) 40 min   ? Activity Tolerance Patient tolerated treatment well   ? Behavior During Therapy [facility] for tasks assessed/performed   ? ?  ?  ? ?  ? ? ?Past Medical History:  ?Diagnosis Date  ? Anxiety   ? Arthritis   ? Depression   ? Fatty liver disease, nonalcoholic   ? GERD (gastroesophageal reflux disease)   ? Hepatitis 1973  ? hep c-was treated  ? Hyperlipemia   ? Hypertension   ? ? ?Past Surgical History:  ?Procedure Laterality Date  ? BACK SURGERY    ? CARPAL TUNNEL RELEASE Bilateral   ? CHOLECYSTECTOMY    ? DIAGNOSTIC LAPAROSCOPY    ? INCONTINENCE SURGERY    ? LAPAROSCOPIC LYSIS OF ADHESIONS    ? REVERSE SHOULDER ARTHROPLASTY Right 03/20/2021  ? Procedure: REVERSE SHOULDER ARTHROPLASTY;  Surgeon: Hiram Gash, MD;  Location: [facility];  Service: Orthopedics;  Laterality: Right;  ? ? ?There were no vitals filed for this visit. ? ? Subjective Assessment - 07/02/21 1319   ? ? Subjective Doing ok just a little sore from yesterday.   ? Patient Stated Goals would like to get arm back to normal- as much as I can after the procedure   ? Currently in Pain? No/denies   ? ?  ?  ? ?  ? ? ? ? ? ? ? ? ? ? ? ? ? ? ? ? ? ? ? ? [facility] Adult PT Treatment/Exercise - 07/02/21 0001   ? ?  ? Shoulder Exercises: Seated  ? Other Seated Exercises UBE L3 24min forward/3 min  backward   ?  ? Shoulder Exercises: Standing  ? Extension Strengthening;Both;20 reps;Weights   ? Extension Weight (lbs) 5   ? Retraction Strengthening;Both;15 reps   ? Retraction Weight (lbs) 10   ? Other Standing Exercises body blade 30 second 3 way exercise 3 rounds; wall ball CW/CCW 1x20 each way; wall ball alphabet x2; shoulder / with 2#  WaTe bar 1x10   ? Other Standing Exercises shoulder ER 2# x2x10; wall pushups 1x10   ?  ? Shoulder Exercises: Stretch  ? Other Shoulder Stretches pillow case flexion and ABD stretches 10x10 second holds   ? Other Shoulder Stretches ER stretch with towel under elbow 10x5 second holds   ? ?  ?  ? ?  ? ? ? ? ? ? ? ? ? ? PT Education - 07/02/21 1358   ? ? Education Details exercise form   ? Person(s) Educated Patient   ? Methods Explanation   ? Comprehension Verbalized understanding   ? ?  ?  ? ?  ? ? ? PT Short Term Goals - 06/11/21 1330   ? ?  ?  PT SHORT TERM GOAL #1  ? Title Will be compliant with appropriate progressive HEP   ? Baseline 2/28- not compliant with daily HEP   ? Time 4   ? Period Weeks   ? Status Achieved   ?  ? PT SHORT TERM GOAL #2  ? Title R shoulder PROM and AAROM to be at least 120 degrees flexion and abduction, 45 degrees ER   ? Time 4   ? Period Weeks   ? Status Partially Met   ?  ? PT SHORT TERM GOAL #3  ? Title Pain to be no more than 4/10 at worst   ? Baseline 2/28- met   ? Time 4   ? Period Weeks   ? Status Achieved   ?  ? PT SHORT TERM GOAL #4  ? Title Soft tissue restrictions in upper traps and biceps/anterior shoulder to be resolved in order to improve pain and ROM   ? Baseline 2/28- much improved, some restrictions still sticking around   ? Time 4   ? Period Weeks   ? Status Partially Met   ? ?  ?  ? ?  ? ? ? ? PT Long Term Goals - 06/13/21 1054   ? ?  ? PT LONG TERM GOAL #1  ? Title AROM R shoulder to be at least 150 degrees flexion and ABD without pain   ? ?  ?  ? ?  ? ? ? ? ? ? ? ? Plan - 07/02/21 1358   ? ? Clinical Impression Statement Michele Hanson  arrives doing OK, had a good time at the beach this weekend. No pain today, seems to be doing better after her setback and fall the other week. We were able to continue with functional strengthening as tolerated today, she is not planning to return to sports or anything that dynamic so we will plan to continue prioritizing functional strength and task performance.   ? Personal Factors and Comorbidities Past/Current Experience;Comorbidity 3+;Time since onset of injury/illness/exacerbation   ? Examination-Activity Limitations Bathing;Bed Mobility;Reach Overhead;Self Feeding;Caring for Others;Sleep;Carry;Dressing;Hygiene/Grooming;Lift;Toileting   ? Examination-Participation Restrictions Meal Prep;Cleaning;Community Activity;Driving;Interpersonal Relationship;Shop;Laundry;Yard Work;Medication Management   ? Stability/Clinical Decision Making Stable/Uncomplicated   ? Clinical Decision Making Low   ? Rehab Potential Good   ? PT Frequency 2x / week   ? PT Duration 6 weeks   ? PT Treatment/Interventions ADLs/Self Care Home Management;Biofeedback;Cryotherapy;Electrical Stimulation;Iontophoresis 4mg /ml Dexamethasone;Moist Heat;Ultrasound;Functional mobility training;Therapeutic activities;Therapeutic exercise;Patient/family education;Manual techniques;Scar mobilization;Passive range of motion;Taping;Vasopneumatic Device   ? PT Next Visit Plan needs more DN to RUE; continue to progress thru protocol. Update HEP. Week 12 is 3/1, progres protocol   ? PT Home Exercise Plan [id number]   ? Consulted and Agree with Plan of Care Patient   ? ?  ?  ? ?  ? ? ?Patient will benefit from skilled therapeutic intervention in order to improve the following deficits and impairments:  Decreased range of motion, Increased fascial restricitons, Increased muscle spasms, Impaired UE functional use, Decreased activity tolerance, Decreased knowledge of precautions, Impaired perceived functional ability, Pain, Decreased scar mobility, Hypomobility,  Improper body mechanics, Decreased strength, Postural dysfunction ? ?Visit Diagnosis: ?Muscle weakness (generalized) ? ?Acute pain of right shoulder ? ?Localized edema ? ?Stiffness of right shoulder, not elsewhere classified ? ?Abnormal posture ? ? ? ? ?Problem List ?Patient Active Problem List  ? Diagnosis Date Noted  ? Alcohol use 07/02/2021  ? De Quervain's tenosynovitis, right 07/22/2019  ? Abrasion of right knee 03/28/2019  ?  Contusion of right chest wall 03/28/2019  ? Sprain of right wrist 03/28/2019  ? Bilateral carpal tunnel syndrome 03/17/2019  ? Hepatic steatosis 03/15/2019  ? Tendinitis of left rotator cuff 01/28/2019  ? Capsulitis of right shoulder 01/28/2019  ? Chronic right shoulder pain 01/21/2019  ? History of hepatitis C 01/21/2019  ? Joint pain in both hands 01/18/2019  ? Need for influenza vaccination 01/18/2019  ? Depression with anxiety 09/15/2018  ? Seasonal allergic rhinitis due to pollen 03/16/2018  ? B12 deficiency 07/16/2017  ? Neuropathy 07/15/2017  ? Spinal stenosis of lumbar region with neurogenic claudication 07/15/2017  ? Elevated cholesterol 07/15/2017  ? Healthcare maintenance 07/15/2017  ? Essential hypertension 07/15/2017  ? Abnormal CBC 07/15/2017  ? Monoclonal paraproteinemia 03/23/2012  ? ?Trask Vosler U PT, DPT, PN2  ? ?Supplemental Physical Therapist ?[facility]  ? ? ? ? ? ?[facility] ?[address] ?[place], [place], [address] ?Phone: [phone]   Fax:  [phone] ? ?Name: Michele Hanson ?MRN: [id number] ?Date of Birth: Jul 24, 1950 ? ? ? ?

## 2021-07-03 ENCOUNTER — Encounter: Payer: Self-pay | Admitting: Physical Therapy

## 2021-07-03 ENCOUNTER — Ambulatory Visit: Payer: Medicare Other | Admitting: Physical Therapy

## 2021-07-03 DIAGNOSIS — M6281 Muscle weakness (generalized): Secondary | ICD-10-CM | POA: Diagnosis not present

## 2021-07-03 DIAGNOSIS — R600 Localized edema: Secondary | ICD-10-CM

## 2021-07-03 DIAGNOSIS — M25511 Pain in right shoulder: Secondary | ICD-10-CM

## 2021-07-03 DIAGNOSIS — M25611 Stiffness of right shoulder, not elsewhere classified: Secondary | ICD-10-CM

## 2021-07-03 NOTE — Therapy (Signed)
[facility] ?[place] ?[address] ?[place], [place], [address] ?Phone: [phone]   Fax:  [phone] ? ?Physical Therapy Treatment ? ?Patient Details  ?Name: Michele Hanson ?MRN: [id number] ?Date of Birth: 06/25/1950 ?Referring Provider (PT): Dax Griffin Basil ? ? ?Encounter Date: 07/03/2021 ? ? PT End of Session - 07/03/21 1142   ? ? Visit Number 25   ? Date for PT Re-Evaluation 07/23/21   ? PT Start Time 1100   ? PT Stop Time 1145   ? PT Time Calculation (min) 45 min   ? Activity Tolerance Patient tolerated treatment well   ? Behavior During Therapy [facility] for tasks assessed/performed   ? ?  ?  ? ?  ? ? ?Past Medical History:  ?Diagnosis Date  ? Anxiety   ? Arthritis   ? Depression   ? Fatty liver disease, nonalcoholic   ? GERD (gastroesophageal reflux disease)   ? Hepatitis 1973  ? hep c-was treated  ? Hyperlipemia   ? Hypertension   ? ? ?Past Surgical History:  ?Procedure Laterality Date  ? BACK SURGERY    ? CARPAL TUNNEL RELEASE Bilateral   ? CHOLECYSTECTOMY    ? DIAGNOSTIC LAPAROSCOPY    ? INCONTINENCE SURGERY    ? LAPAROSCOPIC LYSIS OF ADHESIONS    ? REVERSE SHOULDER ARTHROPLASTY Right 03/20/2021  ? Procedure: REVERSE SHOULDER ARTHROPLASTY;  Surgeon: Hiram Gash, MD;  Location: [facility];  Service: Orthopedics;  Laterality: Right;  ? ? ?There were no vitals filed for this visit. ? ? Subjective Assessment - 07/03/21 1104   ? ? Subjective "Im feeling ok" "I did wake up hurting last night"   ? Currently in Pain? No/denies   ? ?  ?  ? ?  ? ? ? ? ? ? ? ? ? ? ? ? ? ? ? ? ? ? ? ? [place] Adult PT Treatment/Exercise - 07/03/21 0001   ? ?  ? Shoulder Exercises: Supine  ? Flexion Strengthening;Right;Weights   ? Shoulder Flexion Weight (lbs) 3   ? ABduction Right;15 reps;Strengthening;AROM   ? Other Supine Exercises 1lb ER/IR 2x10   ?  ? Shoulder Exercises: Standing  ? Horizontal ABduction Strengthening;Both;20 reps;Theraband   ? Theraband Level (Shoulder Horizontal  ABduction) Level 1 (Yellow)   ? Extension Strengthening;Both;20 reps;Weights   ? Extension Weight (lbs) 5   ? Retraction Strengthening;Both;20 reps;Weights   ? Retraction Weight (lbs) 10   ? Other Standing Exercises tricep Ext 20lb 2x15   ?  ? Shoulder Exercises: ROM/Strengthening  ? UBE (Upper Arm Bike) L2.1 x 3 min each   ?  ? Shoulder Exercises: Power Tower  ? Other Power Education officer, community Rows & lats 2x10   ?  ? Manual Therapy  ? Manual Therapy Passive ROM   ? Passive ROM RUE all directions   ? ?  ?  ? ?  ? ? ? ? ? ? ? ? ? ? ? ? PT Short Term Goals - 06/11/21 1330   ? ?  ? PT SHORT TERM GOAL #1  ? Title Will be compliant with appropriate progressive HEP   ? Baseline 2/28- not compliant with daily HEP   ? Time 4   ? Period Weeks   ? Status Achieved   ?  ? PT SHORT TERM GOAL #2  ? Title R shoulder PROM and AAROM to be at least 120 degrees flexion and abduction, 45 degrees ER   ? Time 4   ?  Period Weeks   ? Status Partially Met   ?  ? PT SHORT TERM GOAL #3  ? Title Pain to be no more than 4/10 at worst   ? Baseline 2/28- met   ? Time 4   ? Period Weeks   ? Status Achieved   ?  ? PT SHORT TERM GOAL #4  ? Title Soft tissue restrictions in upper traps and biceps/anterior shoulder to be resolved in order to improve pain and ROM   ? Baseline 2/28- much improved, some restrictions still sticking around   ? Time 4   ? Period Weeks   ? Status Partially Met   ? ?  ?  ? ?  ? ? ? ? PT Long Term Goals - 06/13/21 1054   ? ?  ? PT LONG TERM GOAL #1  ? Title AROM R shoulder to be at least 150 degrees flexion and ABD without pain   ? ?  ?  ? ?  ? ? ? ? ? ? ? ? Plan - 07/03/21 1143   ? ? Clinical Impression Statement Pt able to complete all interventions . RUE external rotation was very weak unable to tolerated any resistance when standing. Pt was able to complete RUE ER/IR in the supine position under light resistance, but did report some pain with ER. Pt  did really well with all machine level interventions. Some fatigue with  triceps ext.   ? Personal Factors and Comorbidities Past/Current Experience;Comorbidity 3+;Time since onset of injury/illness/exacerbation   ? Examination-Activity Limitations Bathing;Bed Mobility;Reach Overhead;Self Feeding;Caring for Others;Sleep;Carry;Dressing;Hygiene/Grooming;Lift;Toileting   ? Examination-Participation Restrictions Meal Prep;Cleaning;Community Activity;Driving;Interpersonal Relationship;Shop;Laundry;Yard Work;Medication Management   ? Stability/Clinical Decision Making Stable/Uncomplicated   ? Rehab Potential Good   ? PT Frequency 2x / week   ? PT Duration 6 weeks   ? PT Treatment/Interventions ADLs/Self Care Home Management;Biofeedback;Cryotherapy;Electrical Stimulation;Iontophoresis 4mg /ml Dexamethasone;Moist Heat;Ultrasound;Functional mobility training;Therapeutic activities;Therapeutic exercise;Patient/family education;Manual techniques;Scar mobilization;Passive range of motion;Taping;Vasopneumatic Device   ? PT Next Visit Plan needs more DN to RUE; continue to progress thru protocol. Update HEP. Week 12 is 3/1, progres protocol   ? ?  ?  ? ?  ? ? ?Patient will benefit from skilled therapeutic intervention in order to improve the following deficits and impairments:  Decreased range of motion, Increased fascial restricitons, Increased muscle spasms, Impaired UE functional use, Decreased activity tolerance, Decreased knowledge of precautions, Impaired perceived functional ability, Pain, Decreased scar mobility, Hypomobility, Improper body mechanics, Decreased strength, Postural dysfunction ? ?Visit Diagnosis: ?Muscle weakness (generalized) ? ?Localized edema ? ?Stiffness of right shoulder, not elsewhere classified ? ?Acute pain of right shoulder ? ? ? ? ?Problem List ?Patient Active Problem List  ? Diagnosis Date Noted  ? Alcohol use 07/02/2021  ? De Quervain's tenosynovitis, right 07/22/2019  ? Abrasion of right knee 03/28/2019  ? Contusion of right chest wall 03/28/2019  ? Sprain of right  wrist 03/28/2019  ? Bilateral carpal tunnel syndrome 03/17/2019  ? Hepatic steatosis 03/15/2019  ? Tendinitis of left rotator cuff 01/28/2019  ? Capsulitis of right shoulder 01/28/2019  ? Chronic right shoulder pain 01/21/2019  ? History of hepatitis C 01/21/2019  ? Joint pain in both hands 01/18/2019  ? Need for influenza vaccination 01/18/2019  ? Depression with anxiety 09/15/2018  ? Seasonal allergic rhinitis due to pollen 03/16/2018  ? B12 deficiency 07/16/2017  ? Neuropathy 07/15/2017  ? Spinal stenosis of lumbar region with neurogenic claudication 07/15/2017  ? Elevated cholesterol 07/15/2017  ? Healthcare maintenance 07/15/2017  ?  Essential hypertension 07/15/2017  ? Abnormal CBC 07/15/2017  ? Monoclonal paraproteinemia 03/23/2012  ? ? ?Scot Jun, PTA ?07/03/2021, 11:45 AM ? ?[facility] ?[address] [place] ?[place], [place], [address] ?Phone: [phone]   Fax:  [phone] ? ?Name: Michele Hanson ?MRN: [id number] ?Date of Birth: 08-Jul-1950 ? ? ? ?

## 2021-07-04 ENCOUNTER — Encounter: Payer: Medicare Other | Admitting: Physical Therapy

## 2021-07-08 ENCOUNTER — Other Ambulatory Visit: Payer: Self-pay

## 2021-07-08 ENCOUNTER — Other Ambulatory Visit: Payer: Self-pay | Admitting: Family Medicine

## 2021-07-08 ENCOUNTER — Ambulatory Visit: Payer: Medicare Other | Admitting: Physical Therapy

## 2021-07-08 ENCOUNTER — Encounter: Payer: Self-pay | Admitting: Physical Therapy

## 2021-07-08 DIAGNOSIS — M6281 Muscle weakness (generalized): Secondary | ICD-10-CM

## 2021-07-08 DIAGNOSIS — M25511 Pain in right shoulder: Secondary | ICD-10-CM

## 2021-07-08 DIAGNOSIS — M25611 Stiffness of right shoulder, not elsewhere classified: Secondary | ICD-10-CM

## 2021-07-08 DIAGNOSIS — R600 Localized edema: Secondary | ICD-10-CM

## 2021-07-08 DIAGNOSIS — R293 Abnormal posture: Secondary | ICD-10-CM

## 2021-07-08 NOTE — Therapy (Signed)
[place] ?[place] ?[address] ?[place], [place], [address] ?Phone: [phone]   Fax:  [phone] ? ?Physical Therapy Treatment ? ?Patient Details  ?Name: Michele Hanson ?MRN: [id number] ?Date of Birth: 08-09-1950 ?Referring Provider (PT): Dax Griffin Basil ? ? ?Encounter Date: 07/08/2021 ? ? PT End of Session - 07/08/21 1341   ? ? Visit Number 26   ? Date for PT Re-Evaluation 07/23/21   ? Authorization Type [facility] MCR   ? Authorization Time Period 04/17/21 to 06/12/21; extended to 4/11   ? PT Start Time 1100   ? PT Stop Time 1145   ? PT Time Calculation (min) 45 min   ? Activity Tolerance Patient tolerated treatment well   ? Behavior During Therapy [facility] for tasks assessed/performed   ? ?  ?  ? ?  ? ? ?Past Medical History:  ?Diagnosis Date  ? Anxiety   ? Arthritis   ? Depression   ? Fatty liver disease, nonalcoholic   ? GERD (gastroesophageal reflux disease)   ? Hepatitis 1973  ? hep c-was treated  ? Hyperlipemia   ? Hypertension   ? ? ?Past Surgical History:  ?Procedure Laterality Date  ? BACK SURGERY    ? CARPAL TUNNEL RELEASE Bilateral   ? CHOLECYSTECTOMY    ? DIAGNOSTIC LAPAROSCOPY    ? INCONTINENCE SURGERY    ? LAPAROSCOPIC LYSIS OF ADHESIONS    ? REVERSE SHOULDER ARTHROPLASTY Right 03/20/2021  ? Procedure: REVERSE SHOULDER ARTHROPLASTY;  Surgeon: Hiram Gash, MD;  Location: [facility];  Service: Orthopedics;  Laterality: Right;  ? ? ?There were no vitals filed for this visit. ? ? Subjective Assessment - 07/08/21 1304   ? ? Subjective "Everything is going fine"   ? Currently in Pain? No/denies   ? ?  ?  ? ?  ? ? ? ? ? ? ? ? ? ? ? ? ? ? ? ? ? ? ? ? [place] Adult PT Treatment/Exercise - 07/08/21 0001   ? ?  ? Shoulder Exercises: Seated  ? Other Seated Exercises bent over rows 4lb, Ext 2lb, rev flys 2lb 2x10   ?  ? Shoulder Exercises: Standing  ? Horizontal ABduction Strengthening;Both;20 reps;Theraband   ? Theraband Level (Shoulder Horizontal ABduction) Level 2  (Red)   ? Flexion Strengthening;Both;20 reps;Theraband   ? Shoulder Flexion Weight (lbs) 3   ? ABduction Strengthening;Both;20 reps   ? Shoulder ABduction Weight (lbs) 2   ? Retraction Strengthening;Both;20 reps;Weights   ? Retraction Weight (lbs) 10   ? Other Standing Exercises Flex up wall 2lb pilow case 2x10, Abd up wall with pillow case 2x10   ? Other Standing Exercises tricep Ext 20lb 2x15, Bicep curls 2lb 2x10   ?  ? Shoulder Exercises: ROM/Strengthening  ? UBE (Upper Arm Bike) L2.1 x 3 min each   ?  ? Shoulder Exercises: Power Tower  ? Other Power Therapist, sports & lats 2x10   ? ?  ?  ? ?  ? ? ? ? ? ? ? ? ? ? ? ? PT Short Term Goals - 06/11/21 1330   ? ?  ? PT SHORT TERM GOAL #1  ? Title Will be compliant with appropriate progressive HEP   ? Baseline 2/28- not compliant with daily HEP   ? Time 4   ? Period Weeks   ? Status Achieved   ?  ? PT SHORT TERM GOAL #2  ? Title R shoulder PROM  and AAROM to be at least 120 degrees flexion and abduction, 45 degrees ER   ? Time 4   ? Period Weeks   ? Status Partially Met   ?  ? PT SHORT TERM GOAL #3  ? Title Pain to be no more than 4/10 at worst   ? Baseline 2/28- met   ? Time 4   ? Period Weeks   ? Status Achieved   ?  ? PT SHORT TERM GOAL #4  ? Title Soft tissue restrictions in upper traps and biceps/anterior shoulder to be resolved in order to improve pain and ROM   ? Baseline 2/28- much improved, some restrictions still sticking around   ? Time 4   ? Period Weeks   ? Status Partially Met   ? ?  ?  ? ?  ? ? ? ? PT Long Term Goals - 07/08/21 1341   ? ?  ? PT LONG TERM GOAL #1  ? Title AROM R shoulder to be at least 150 degrees flexion and ABD without pain   ? Status On-going   ?  ? PT LONG TERM GOAL #2  ? Title MMT in R shoulder flexion and abduction to be at least 4/5   ? Status On-going   ? ?  ?  ? ?  ? ? ? ? ? ? ? ? Plan - 07/08/21 1342   ? ? Clinical Impression Statement Pt enters clinic feeling well. Good effort given with all interventions. some  postural compensation noted with pillow case on wall interventions. Some R shoulder elevation noted with flexion and abduction. Tactile cues required to prevent trunk flexion with standing shoulder extensions.   ? Personal Factors and Comorbidities Past/Current Experience;Comorbidity 3+;Time since onset of injury/illness/exacerbation   ? Examination-Activity Limitations Bathing;Bed Mobility;Reach Overhead;Self Feeding;Caring for Others;Sleep;Carry;Dressing;Hygiene/Grooming;Lift;Toileting   ? Examination-Participation Restrictions Meal Prep;Cleaning;Community Activity;Driving;Interpersonal Relationship;Shop;Laundry;Yard Work;Medication Management   ? Stability/Clinical Decision Making Stable/Uncomplicated   ? Rehab Potential Good   ? PT Frequency 2x / week   ? PT Treatment/Interventions ADLs/Self Care Home Management;Biofeedback;Cryotherapy;Electrical Stimulation;Iontophoresis 44m/ml Dexamethasone;Moist Heat;Ultrasound;Functional mobility training;Therapeutic activities;Therapeutic exercise;Patient/family education;Manual techniques;Scar mobilization;Passive range of motion;Taping;Vasopneumatic Device   ? PT Next Visit Plan needs more DN to RUE; continue to progress thru protocol. Update HEP. Week 12 is 3/1, progres protocol   ? ?  ?  ? ?  ? ? ?Patient will benefit from skilled therapeutic intervention in order to improve the following deficits and impairments:  Decreased range of motion, Increased fascial restricitons, Increased muscle spasms, Impaired UE functional use, Decreased activity tolerance, Decreased knowledge of precautions, Impaired perceived functional ability, Pain, Decreased scar mobility, Hypomobility, Improper body mechanics, Decreased strength, Postural dysfunction ? ?Visit Diagnosis: ?Muscle weakness (generalized) ? ?Stiffness of right shoulder, not elsewhere classified ? ?Acute pain of right shoulder ? ?Abnormal posture ? ?Localized edema ? ? ? ? ?Problem List ?Patient Active Problem List  ?  Diagnosis Date Noted  ? Alcohol use 07/02/2021  ? De Quervain's tenosynovitis, right 07/22/2019  ? Abrasion of right knee 03/28/2019  ? Contusion of right chest wall 03/28/2019  ? Sprain of right wrist 03/28/2019  ? Bilateral carpal tunnel syndrome 03/17/2019  ? Hepatic steatosis 03/15/2019  ? Tendinitis of left rotator cuff 01/28/2019  ? Capsulitis of right shoulder 01/28/2019  ? Chronic right shoulder pain 01/21/2019  ? History of hepatitis C 01/21/2019  ? Joint pain in both hands 01/18/2019  ? Need for influenza vaccination 01/18/2019  ? Depression with anxiety 09/15/2018  ?  Seasonal allergic rhinitis due to pollen 03/16/2018  ? B12 deficiency 07/16/2017  ? Neuropathy 07/15/2017  ? Spinal stenosis of lumbar region with neurogenic claudication 07/15/2017  ? Elevated cholesterol 07/15/2017  ? Healthcare maintenance 07/15/2017  ? Essential hypertension 07/15/2017  ? Abnormal CBC 07/15/2017  ? Monoclonal paraproteinemia 03/23/2012  ? ? ?Scot Jun, PTA ?07/08/2021, 1:45 PM ? ?[facility] ?[place] ?[address] ?[place], [place], [address] ?Phone: [phone]   Fax:  [phone] ? ?Name: Michele Hanson ?MRN: [id number] ?Date of Birth: 1950/11/26 ? ? ? ?

## 2021-07-09 ENCOUNTER — Ambulatory Visit: Payer: Medicare Other | Admitting: Physical Therapy

## 2021-07-09 ENCOUNTER — Encounter: Payer: Self-pay | Admitting: Physical Therapy

## 2021-07-09 DIAGNOSIS — R600 Localized edema: Secondary | ICD-10-CM

## 2021-07-09 DIAGNOSIS — M25611 Stiffness of right shoulder, not elsewhere classified: Secondary | ICD-10-CM

## 2021-07-09 DIAGNOSIS — M25511 Pain in right shoulder: Secondary | ICD-10-CM

## 2021-07-09 DIAGNOSIS — M6281 Muscle weakness (generalized): Secondary | ICD-10-CM

## 2021-07-09 DIAGNOSIS — R293 Abnormal posture: Secondary | ICD-10-CM

## 2021-07-09 LAB — ETHYL GLUCURONIDE LC/MS/MS
EtG/EtS LC/MS/MS: POSITIVE — AB
Ethyl Glucuronide LC/MS/MS: >50000 ng/mL
Ethyl Glucuronide: POSITIVE — AB
Ethyl Sulfate LC/MS/MS: 10285 ng/mL
Ethyl Sulfate: POSITIVE — AB

## 2021-07-09 LAB — ETHYL GLUCURONIDE, URINE

## 2021-07-09 MED ORDER — OMEPRAZOLE 20 MG PO CPDR: 20.0000 mg | DELAYED_RELEASE_CAPSULE | Freq: Every day | ORAL | 0 refills | Status: DC

## 2021-07-09 NOTE — Therapy (Signed)
[place] ?[place] ?[address] ?[place], [place], [address] ?Phone: [phone]   Fax:  [phone] ? ?Physical Therapy Treatment ? ?Patient Details  ?Name: Michele Hanson ?MRN: [id number] ?Date of Birth: 12/20/1950 ?Referring Provider (PT): Michele Hanson ? ? ?Encounter Date: 07/09/2021 ? ? PT End of Session - 07/09/21 1404   ? ? Visit Number 27   ? Number of Visits 30   ? Date for PT Re-Evaluation 07/23/21   ? Authorization Type [facility] MCR   ? Authorization Time Period 04/17/21 to 06/12/21; extended to 4/11   ? Progress Note Due on Visit 28   ? PT Start Time 1319   ? PT Stop Time 1358   ? PT Time Calculation (min) 39 min   ? Activity Tolerance Patient tolerated treatment well   ? Behavior During Therapy [facility] for tasks assessed/performed   ? ?  ?  ? ?  ? ? ?Past Medical History:  ?Diagnosis Date  ? Anxiety   ? Arthritis   ? Depression   ? Fatty liver disease, nonalcoholic   ? GERD (gastroesophageal reflux disease)   ? Hepatitis 1973  ? hep c-was treated  ? Hyperlipemia   ? Hypertension   ? ? ?Past Surgical History:  ?Procedure Laterality Date  ? BACK SURGERY    ? CARPAL TUNNEL RELEASE Bilateral   ? CHOLECYSTECTOMY    ? DIAGNOSTIC LAPAROSCOPY    ? INCONTINENCE SURGERY    ? LAPAROSCOPIC LYSIS OF ADHESIONS    ? REVERSE SHOULDER ARTHROPLASTY Right 03/20/2021  ? Procedure: REVERSE SHOULDER ARTHROPLASTY;  Surgeon: Michele Gash, MD;  Location: [facility];  Service: Orthopedics;  Laterality: Right;  ? ? ?There were no vitals filed for this visit. ? ? Subjective Assessment - 07/09/21 1320   ? ? Subjective I felt good after last session, I'm just sore from yesterday. I had something going on later this week so I had to get back to back sessions. I'm close to where I'd like to be, I just don't have a lot of rotation in the shoulder   ? Currently in Pain? No/denies   not really pain just soreness from workout yesterday  ? ?  ?  ? ?  ? ? ? ? ? ? ? ? ? ? ? ? ? ? ? ? ? ? ? ?  [place] Adult PT Treatment/Exercise - 07/09/21 0001   ? ?  ? Shoulder Exercises: Supine  ? Flexion Right;15 reps   ? Shoulder Flexion Weight (lbs) 2   ?  ? Shoulder Exercises: Seated  ? Other Seated Exercises UBE L3 90mn forward/3 min backward   ?  ? Shoulder Exercises: Sidelying  ? External Rotation AROM;20 reps   ? External Rotation Weight (lbs) 0   ? External Rotation Limitations towel tucked under elbow   ? ABduction Strengthening;Right;15 reps   ? ABduction Weight (lbs) 2   ?  ? Shoulder Exercises: Standing  ? Other Standing Exercises dowel IR stretches 10x10 seconds, ER doorway stretch 10x10 seconds   ? Other Standing Exercises standing shoulder flexion and ABD stretches on wall 5x10  seconds each   ?  ? Shoulder Exercises: Power Tower  ? Other Power Tower Exercises bicep curls 15# 2x10; tricep curls 2x10 25#   ? Other Power TEngineer, waterrows and lats 20# 2x10; shoulder extensions 10# 2x10 wall pushups 2x10   ? ?  ?  ? ?  ? ? ? ? ? ? ? ? ? ?  PT Education - 07/09/21 1404   ? ? Education Details exercise form/reasons for modifications today   ? Person(s) Educated Patient   ? Methods Explanation   ? Comprehension Verbalized understanding   ? ?  ?  ? ?  ? ? ? PT Short Term Goals - 06/11/21 1330   ? ?  ? PT SHORT TERM GOAL #1  ? Title Will be compliant with appropriate progressive HEP   ? Baseline 2/28- not compliant with daily HEP   ? Time 4   ? Period Weeks   ? Status Achieved   ?  ? PT SHORT TERM GOAL #2  ? Title R shoulder PROM and AAROM to be at least 120 degrees flexion and abduction, 45 degrees ER   ? Time 4   ? Period Weeks   ? Status Partially Met   ?  ? PT SHORT TERM GOAL #3  ? Title Pain to be no more than 4/10 at worst   ? Baseline 2/28- met   ? Time 4   ? Period Weeks   ? Status Achieved   ?  ? PT SHORT TERM GOAL #4  ? Title Soft tissue restrictions in upper traps and biceps/anterior shoulder to be resolved in order to improve pain and ROM   ? Baseline 2/28- much improved, some restrictions still  sticking around   ? Time 4   ? Period Weeks   ? Status Partially Met   ? ?  ?  ? ?  ? ? ? ? PT Long Term Goals - 07/08/21 1341   ? ?  ? PT LONG TERM GOAL #1  ? Title AROM R shoulder to be at least 150 degrees flexion and ABD without pain   ? Status On-going   ?  ? PT LONG TERM GOAL #2  ? Title MMT in R shoulder flexion and abduction to be at least 4/5   ? Status On-going   ? ?  ?  ? ?  ? ? ? ? ? ? ? ? Plan - 07/09/21 1404   ? ? Clinical Impression Statement Michele Hanson arrives doing well today, she was a bit sore from the workout yesterday- had to do back to back sessions due to some plans she had later in the week. We continued working on her functional strength, she is a bit concerned about her IR/ER ROM; educated that some of this is a matter of time as she continues to recover after surgery, but we did do some extra ROM and strength work for IR/ER today as well. Tried more dumbbell strength training, she really compensates a lot with thoracic extension, and if not this then upper trap in standing so we did these in supine today. Will continue as able and tolerated.   ? Personal Factors and Comorbidities Past/Current Experience;Comorbidity 3+;Time since onset of injury/illness/exacerbation   ? Examination-Activity Limitations Bathing;Bed Mobility;Reach Overhead;Self Feeding;Caring for Others;Sleep;Carry;Dressing;Hygiene/Grooming;Lift;Toileting   ? Examination-Participation Restrictions Meal Prep;Cleaning;Community Activity;Driving;Interpersonal Relationship;Shop;Laundry;Yard Work;Medication Management   ? Stability/Clinical Decision Making Stable/Uncomplicated   ? Clinical Decision Making Low   ? Rehab Potential Good   ? PT Frequency 2x / week   ? PT Duration 6 weeks   ? PT Treatment/Interventions ADLs/Self Care Home Management;Biofeedback;Cryotherapy;Electrical Stimulation;Iontophoresis 63m/ml Dexamethasone;Moist Heat;Ultrasound;Functional mobility training;Therapeutic activities;Therapeutic exercise;Patient/family  education;Manual techniques;Scar mobilization;Passive range of motion;Taping;Vasopneumatic Device   ? PT Next Visit Plan needs more DN to RUE; continue to progress thru protocol. Update HEP. Week 12 is 3/1, progres protocol   ?  PT Home Exercise Plan [email or web address]   ? Consulted and Agree with Plan of Care Patient   ? ?  ?  ? ?  ? ? ?Patient will benefit from skilled therapeutic intervention in order to improve the following deficits and impairments:  Decreased range of motion, Increased fascial restricitons, Increased muscle spasms, Impaired UE functional use, Decreased activity tolerance, Decreased knowledge of precautions, Impaired perceived functional ability, Pain, Decreased scar mobility, Hypomobility, Improper body mechanics, Decreased strength, Postural dysfunction ? ?Visit Diagnosis: ?Muscle weakness (generalized) ? ?Stiffness of right shoulder, not elsewhere classified ? ?Acute pain of right shoulder ? ?Abnormal posture ? ?Localized edema ? ? ? ? ?Problem List ?Patient Active Problem List  ? Diagnosis Date Noted  ? Alcohol use 07/02/2021  ? De Quervain's tenosynovitis, right 07/22/2019  ? Abrasion of right knee 03/28/2019  ? Contusion of right chest wall 03/28/2019  ? Sprain of right wrist 03/28/2019  ? Bilateral carpal tunnel syndrome 03/17/2019  ? Hepatic steatosis 03/15/2019  ? Tendinitis of left rotator cuff 01/28/2019  ? Capsulitis of right shoulder 01/28/2019  ? Chronic right shoulder pain 01/21/2019  ? History of hepatitis C 01/21/2019  ? Joint pain in both hands 01/18/2019  ? Need for influenza vaccination 01/18/2019  ? Depression with anxiety 09/15/2018  ? Seasonal allergic rhinitis due to pollen 03/16/2018  ? B12 deficiency 07/16/2017  ? Neuropathy 07/15/2017  ? Spinal stenosis of lumbar region with neurogenic claudication 07/15/2017  ? Elevated cholesterol 07/15/2017  ? Healthcare maintenance 07/15/2017  ? Essential hypertension 07/15/2017  ? Abnormal CBC 07/15/2017  ? Monoclonal paraproteinemia  03/23/2012  ? ?Kristen U PT, DPT, PN2  ? ?Supplemental Physical Therapist ?[place]  ? ? ? ? ? ?[place] ?[address] ?[address] ?[place], [place], [address] ?Phone: [phone]

## 2021-07-09 NOTE — Telephone Encounter (Signed)
Chart supports Rx ?Last seen 07/02/2021 ?Next OV 920/23 ?

## 2021-07-11 ENCOUNTER — Ambulatory Visit: Payer: Medicare Other | Admitting: Physical Therapy

## 2021-07-16 ENCOUNTER — Ambulatory Visit: Payer: Medicare Other | Attending: Orthopaedic Surgery | Admitting: Physical Therapy

## 2021-07-16 ENCOUNTER — Encounter: Payer: Self-pay | Admitting: Physical Therapy

## 2021-07-16 DIAGNOSIS — M25511 Pain in right shoulder: Secondary | ICD-10-CM | POA: Insufficient documentation

## 2021-07-16 DIAGNOSIS — M25611 Stiffness of right shoulder, not elsewhere classified: Secondary | ICD-10-CM | POA: Insufficient documentation

## 2021-07-16 DIAGNOSIS — R293 Abnormal posture: Secondary | ICD-10-CM | POA: Diagnosis present

## 2021-07-16 DIAGNOSIS — M6281 Muscle weakness (generalized): Secondary | ICD-10-CM | POA: Diagnosis present

## 2021-07-16 DIAGNOSIS — R600 Localized edema: Secondary | ICD-10-CM | POA: Insufficient documentation

## 2021-07-16 NOTE — Therapy (Signed)
[facility] [place] ?[place] [address] ?[place], [place], [address] ?Phone: [phone]   Fax:  [phone] ? ?Physical Therapy Treatment ? ?Patient Details  ?Name: Michele Hanson ?MRN: [id number] ?Date of Birth: 01-15-1951 ?Referring Provider (PT): Dax Griffin Basil ? ? ?Encounter Date: 07/16/2021 ? ?Progress Note ?Reporting Period 06/11/21 to 07/16/21 ? ?See note below for Objective Data and Assessment of Progress/Goals.  ? ?  ? ? PT End of Session - 07/16/21 1356   ? ? Visit Number 28   ? Number of Visits 30   ? Date for PT Re-Evaluation 07/23/21   ? Authorization Type [facility] MCR   ? Authorization Time Period 04/17/21 to 06/12/21; extended to 4/11   ? Progress Note Due on Visit 28   ? PT Start Time 5643   ? PT Stop Time 3295   ? PT Time Calculation (min) 40 min   ? Activity Tolerance Patient tolerated treatment well   ? Behavior During Therapy [facility] for tasks assessed/performed   ? ?  ?  ? ?  ? ? ?Past Medical History:  ?Diagnosis Date  ? Anxiety   ? Arthritis   ? Depression   ? Fatty liver disease, nonalcoholic   ? GERD (gastroesophageal reflux disease)   ? Hepatitis 1973  ? hep c-was treated  ? Hyperlipemia   ? Hypertension   ? ? ?Past Surgical History:  ?Procedure Laterality Date  ? BACK SURGERY    ? CARPAL TUNNEL RELEASE Bilateral   ? CHOLECYSTECTOMY    ? DIAGNOSTIC LAPAROSCOPY    ? INCONTINENCE SURGERY    ? LAPAROSCOPIC LYSIS OF ADHESIONS    ? REVERSE SHOULDER ARTHROPLASTY Right 03/20/2021  ? Procedure: REVERSE SHOULDER ARTHROPLASTY;  Surgeon: Hiram Gash, MD;  Location: [facility];  Service: Orthopedics;  Laterality: Right;  ? ? ?There were no vitals filed for this visit. ? ? Subjective Assessment - 07/16/21 1318   ? ? Subjective I'm sore after doing some exercises that a friend who had shoulder surgery gave me, its like alternating your arms to reach to put something on a shelf. My bruising has gotten a lot better   ? Patient Stated Goals would like to get arm back  to normal- as much as I can after the procedure   ? Currently in Pain? No/denies   ? ?  ?  ? ?  ? ? ? ? ? [facility] PT Assessment - 07/16/21 0001   ? ?  ? AROM  ? Right Shoulder Flexion 140 Degrees   approximate before compensations  ? Right Shoulder ABduction 140 Degrees   approximate before compensations  ?  ? Strength  ? Right Shoulder Flexion 4/5   ? Right Shoulder Extension 3/5   ? Right Shoulder ABduction 3+/5   ? ?  ?  ? ?  ? ? ? ? ? ? ? ? ? ? ? ? ? ? ? ? [name] Adult PT Treatment/Exercise - 07/16/21 0001   ? ?  ? Shoulder Exercises: Supine  ? Flexion Right;20 reps   ? Shoulder Flexion Weight (lbs) 2   ? Other Supine Exercises serratus punches 2# 1x20   ?  ? Shoulder Exercises: Seated  ? Other Seated Exercises UBE L3 80mn forward/3 min backward   ?  ? Shoulder Exercises: Prone  ? Extension Right;10 reps   ? Extension Limitations 2 sets   ?  ? Shoulder Exercises: Sidelying  ? ABduction Strengthening;Right;20 reps   ? ABduction Weight (  lbs) 2   ?  ? Shoulder Exercises: Standing  ? Other Standing Exercises ER shoulder stretches 10x10 second holds   ?  ? Shoulder Exercises: Power Tower  ? Extension 10 reps   ? Extension Limitations 10# had to modify due to shoulder soreness   ? Other Power Tower Exercises rows 20# 1x15,lat pulls 20# 1x15   ? ?  ?  ? ?  ? ? ? ? ? ? ? ? ? ? PT Education - 07/16/21 1356   ? ? Education Details exericse form/purpose, hold off of exercise her friend gave her   ? Person(s) Educated Patient   ? Methods Explanation   ? Comprehension Verbalized understanding   ? ?  ?  ? ?  ? ? ? PT Short Term Goals - 06/11/21 1330   ? ?  ? PT SHORT TERM GOAL #1  ? Title Will be compliant with appropriate progressive HEP   ? Baseline 2/28- not compliant with daily HEP   ? Time 4   ? Period Weeks   ? Status Achieved   ?  ? PT SHORT TERM GOAL #2  ? Title R shoulder PROM and AAROM to be at least 120 degrees flexion and abduction, 45 degrees ER   ? Time 4   ? Period Weeks   ? Status Partially Met   ?  ? PT SHORT TERM  GOAL #3  ? Title Pain to be no more than 4/10 at worst   ? Baseline 2/28- met   ? Time 4   ? Period Weeks   ? Status Achieved   ?  ? PT SHORT TERM GOAL #4  ? Title Soft tissue restrictions in upper traps and biceps/anterior shoulder to be resolved in order to improve pain and ROM   ? Baseline 2/28- much improved, some restrictions still sticking around   ? Time 4   ? Period Weeks   ? Status Partially Met   ? ?  ?  ? ?  ? ? ? ? PT Long Term Goals - 07/08/21 1341   ? ?  ? PT LONG TERM GOAL #1  ? Title AROM R shoulder to be at least 150 degrees flexion and ABD without pain   ? Status On-going   ?  ? PT LONG TERM GOAL #2  ? Title MMT in R shoulder flexion and abduction to be at least 4/5   ? Status On-going   ? ?  ?  ? ?  ? ? ? ? ? ? ? ? Plan - 07/16/21 1359   ? ? Clinical Impression Statement [name] arrives doing well, she has been working on exercises that a friend who had shoulder surgery gave her and they are making her sore. We continued working on functional strengthening today, per her protocol she really has no formal limitations, but she does continue to compensate quite a bit with thoracic spine. Had to do some strengthening in supine today due to poor form and upper trap compensation patterns. Will continue efforts. She really is making progress but most limited by functional strength at this time.   ? Personal Factors and Comorbidities Past/Current Experience;Comorbidity 3+;Time since onset of injury/illness/exacerbation   ? Examination-Activity Limitations Bathing;Bed Mobility;Reach Overhead;Self Feeding;Caring for Others;Sleep;Carry;Dressing;Hygiene/Grooming;Lift;Toileting   ? Examination-Participation Restrictions Meal Prep;Cleaning;Community Activity;Driving;Interpersonal Relationship;Shop;Laundry;Yard Work;Medication Management   ? Stability/Clinical Decision Making Stable/Uncomplicated   ? Clinical Decision Making Low   ? Rehab Potential Good   ? PT Frequency 2x / week   ?  PT Duration 6 weeks   ? PT  Treatment/Interventions ADLs/Self Care Home Management;Biofeedback;Cryotherapy;Electrical Stimulation;Iontophoresis 85m/ml Dexamethasone;Moist Heat;Ultrasound;Functional mobility training;Therapeutic activities;Therapeutic exercise;Patient/family education;Manual techniques;Scar mobilization;Passive range of motion;Taping;Vasopneumatic Device   ? PT Next Visit Plan needs more DN to RUE; continue to progress thru protocol. Update HEP. Week 12 is 3/1, progres protocol   ? PT Home Exercise Plan [email or web address]  ? Consulted and Agree with Plan of Care Patient   ? ?  ?  ? ?  ? ? ?Patient will benefit from skilled therapeutic intervention in order to improve the following deficits and impairments:  Decreased range of motion, Increased fascial restricitons, Increased muscle spasms, Impaired UE functional use, Decreased activity tolerance, Decreased knowledge of precautions, Impaired perceived functional ability, Pain, Decreased scar mobility, Hypomobility, Improper body mechanics, Decreased strength, Postural dysfunction ? ?Visit Diagnosis: ?Muscle weakness (generalized) ? ?Stiffness of right shoulder, not elsewhere classified ? ?Acute pain of right shoulder ? ?Abnormal posture ? ? ? ? ?Problem List ?Patient Active Problem List  ? Diagnosis Date Noted  ? Alcohol use 07/02/2021  ? De Quervain's tenosynovitis, right 07/22/2019  ? Abrasion of right knee 03/28/2019  ? Contusion of right chest wall 03/28/2019  ? Sprain of right wrist 03/28/2019  ? Bilateral carpal tunnel syndrome 03/17/2019  ? Hepatic steatosis 03/15/2019  ? Tendinitis of left rotator cuff 01/28/2019  ? Capsulitis of right shoulder 01/28/2019  ? Chronic right shoulder pain 01/21/2019  ? History of hepatitis C 01/21/2019  ? Joint pain in both hands 01/18/2019  ? Need for influenza vaccination 01/18/2019  ? Depression with anxiety 09/15/2018  ? Seasonal allergic rhinitis due to pollen 03/16/2018  ? B12 deficiency 07/16/2017  ? Neuropathy 07/15/2017  ? Spinal stenosis  of lumbar region with neurogenic claudication 07/15/2017  ? Elevated cholesterol 07/15/2017  ? Healthcare maintenance 07/15/2017  ? Essential hypertension 07/15/2017  ? Abnormal CBC 07/15/2017  ? [place]

## 2021-07-18 ENCOUNTER — Ambulatory Visit: Payer: Medicare Other | Admitting: Physical Therapy

## 2021-07-18 ENCOUNTER — Encounter: Payer: Self-pay | Admitting: Physical Therapy

## 2021-07-18 DIAGNOSIS — M6281 Muscle weakness (generalized): Secondary | ICD-10-CM | POA: Diagnosis not present

## 2021-07-18 DIAGNOSIS — R293 Abnormal posture: Secondary | ICD-10-CM

## 2021-07-18 DIAGNOSIS — M25511 Pain in right shoulder: Secondary | ICD-10-CM

## 2021-07-18 DIAGNOSIS — M25611 Stiffness of right shoulder, not elsewhere classified: Secondary | ICD-10-CM

## 2021-07-18 NOTE — Therapy (Signed)
[facility] ?[place] ?[address] ?[place], [place], [address] ?Phone: [phone]   Fax:  [phone] ? ?Physical Therapy Treatment ? ?Patient Details  ?Name: Michele Hanson ?MRN: [id number] ?Date of Birth: Feb 20, 1951 ?Referring Provider (PT): Dax Griffin Basil ? ? ?Encounter Date: 07/18/2021 ? ? PT End of Session - 07/18/21 1358   ? ? Visit Number 29   ? Number of Visits 30   ? Date for PT Re-Evaluation 07/23/21   ? Authorization Type [facility] MCR   ? Authorization Time Period 04/17/21 to 06/12/21; extended to 4/11   ? Progress Note Due on Visit 38   ? PT Start Time 0932   ? PT Stop Time 1356   ? PT Time Calculation (min) 39 min   ? Activity Tolerance Patient tolerated treatment well   ? Behavior During Therapy [facility] for tasks assessed/performed   ? ?  ?  ? ?  ? ? ?Past Medical History:  ?Diagnosis Date  ? Anxiety   ? Arthritis   ? Depression   ? Fatty liver disease, nonalcoholic   ? GERD (gastroesophageal reflux disease)   ? Hepatitis 1973  ? hep c-was treated  ? Hyperlipemia   ? Hypertension   ? ? ?Past Surgical History:  ?Procedure Laterality Date  ? BACK SURGERY    ? CARPAL TUNNEL RELEASE Bilateral   ? CHOLECYSTECTOMY    ? DIAGNOSTIC LAPAROSCOPY    ? INCONTINENCE SURGERY    ? LAPAROSCOPIC LYSIS OF ADHESIONS    ? REVERSE SHOULDER ARTHROPLASTY Right 03/20/2021  ? Procedure: REVERSE SHOULDER ARTHROPLASTY;  Surgeon: Hiram Gash, MD;  Location: [facility];  Service: Orthopedics;  Laterality: Right;  ? ? ?There were no vitals filed for this visit. ? ? Subjective Assessment - 07/18/21 1319   ? ? Subjective I'm feeling ok today, just aching nothing new otherwise   ? Currently in Pain? Yes   ? Pain Score 1    ? Pain Location Shoulder   ? Pain Orientation Right   ? Pain Descriptors / Indicators Aching   ? ?  ?  ? ?  ? ? ? ? ? ? ? ? ? ? ? ? ? ? ? ? ? ? ? ? [facility] Adult PT Treatment/Exercise - 07/18/21 0001   ? ?  ? Shoulder Exercises: Supine  ? Flexion Right;10 reps   ? Shoulder  Flexion Weight (lbs) 3   ? Other Supine Exercises ER shoulder stretching x15   ?  ? Shoulder Exercises: Seated  ? Other Seated Exercises UBE L4 41min forward/3 min backward   ?  ? Shoulder Exercises: Power Tower  ? Extension 10 reps   ? Extension Limitations 10# had to modify due to shoulder soreness   ? External Rotation 15 reps   ? External Rotation Limitations 3#   ? Other Power Engineer, water tricep curls 35#x10; bicep curls 5# 1x5   ? Other Power Engineer, water rows 25# 1x10, lats 25# 1x10   ?  ? Shoulder Exercises: Body Blade  ? Other Body Blade Exercises horizontal 30 seconds x2, vertical 30 seconds x2   ?  ? Manual Therapy  ? Manual Therapy Soft tissue mobilization   ? Soft tissue mobilization STM R deltoid, bicep tricep   ? ?  ?  ? ?  ? ? ? ? ? ? ? ? ? ? PT Education - 07/18/21 1358   ? ? Education Details POC moving forward   ? Person(s) Educated  Patient   ? Methods Explanation   ? Comprehension Verbalized understanding   ? ?  ?  ? ?  ? ? ? PT Short Term Goals - 06/11/21 1330   ? ?  ? PT SHORT TERM GOAL #1  ? Title Will be compliant with appropriate progressive HEP   ? Baseline 2/28- not compliant with daily HEP   ? Time 4   ? Period Weeks   ? Status Achieved   ?  ? PT SHORT TERM GOAL #2  ? Title R shoulder PROM and AAROM to be at least 120 degrees flexion and abduction, 45 degrees ER   ? Time 4   ? Period Weeks   ? Status Partially Met   ?  ? PT SHORT TERM GOAL #3  ? Title Pain to be no more than 4/10 at worst   ? Baseline 2/28- met   ? Time 4   ? Period Weeks   ? Status Achieved   ?  ? PT SHORT TERM GOAL #4  ? Title Soft tissue restrictions in upper traps and biceps/anterior shoulder to be resolved in order to improve pain and ROM   ? Baseline 2/28- much improved, some restrictions still sticking around   ? Time 4   ? Period Weeks   ? Status Partially Met   ? ?  ?  ? ?  ? ? ? ? PT Long Term Goals - 07/08/21 1341   ? ?  ? PT LONG TERM GOAL #1  ? Title AROM R shoulder to be at least 150 degrees flexion  and ABD without pain   ? Status On-going   ?  ? PT LONG TERM GOAL #2  ? Title MMT in R shoulder flexion and abduction to be at least 4/5   ? Status On-going   ? ?  ?  ? ?  ? ? ? ? ? ? ? ? Plan - 07/18/21 1359   ? ? Clinical Impression Statement Michele Hanson arrives doing OK today, we continued to focus on her functional strength as this is really her biggest remaining impairment. We still needed to do a lot of strengthening on the mat table due to rapid upper trap compensation patterns when trying to work on strength in standing. Still has a lot of soft tissue restrictions in R upper arm that cause exercise to be painful, worked on this as well.  We will need to reassess soon, may need to extend her care a bit further to continue to work on strengthening moving forward.   ? Personal Factors and Comorbidities Past/Current Experience;Comorbidity 3+;Time since onset of injury/illness/exacerbation   ? Examination-Activity Limitations Bathing;Bed Mobility;Reach Overhead;Self Feeding;Caring for Others;Sleep;Carry;Dressing;Hygiene/Grooming;Lift;Toileting   ? Examination-Participation Restrictions Meal Prep;Cleaning;Community Activity;Driving;Interpersonal Relationship;Shop;Laundry;Yard Work;Medication Management   ? Stability/Clinical Decision Making Stable/Uncomplicated   ? Clinical Decision Making Low   ? Rehab Potential Good   ? PT Frequency 2x / week   ? PT Duration 6 weeks   ? PT Treatment/Interventions ADLs/Self Care Home Management;Biofeedback;Cryotherapy;Electrical Stimulation;Iontophoresis 4mg /ml Dexamethasone;Moist Heat;Ultrasound;Functional mobility training;Therapeutic activities;Therapeutic exercise;Patient/family education;Manual techniques;Scar mobilization;Passive range of motion;Taping;Vasopneumatic Device   ? PT Next Visit Plan needs more DN to RUE, needs to continue PT for strengthening   ? PT Home Exercise Plan [id number]   ? Consulted and Agree with Plan of Care Patient   ? ?  ?  ? ?  ? ? ?Patient will benefit  from skilled therapeutic intervention in order to improve the following deficits and impairments:  Decreased  range of motion, Increased fascial restricitons, Increased muscle spasms, Impaired UE functional use, Decreased activity tolerance, Decreased knowledge of precautions, Impaired perceived functional ability, Pain, Decreased scar mobility, Hypomobility, Improper body mechanics, Decreased strength, Postural dysfunction ? ?Visit Diagnosis: ?Muscle weakness (generalized) ? ?Stiffness of right shoulder, not elsewhere classified ? ?Acute pain of right shoulder ? ?Abnormal posture ? ? ? ? ?Problem List ?Patient Active Problem List  ? Diagnosis Date Noted  ? Alcohol use 07/02/2021  ? De Quervain's tenosynovitis, right 07/22/2019  ? Abrasion of right knee 03/28/2019  ? Contusion of right chest wall 03/28/2019  ? Sprain of right wrist 03/28/2019  ? Bilateral carpal tunnel syndrome 03/17/2019  ? Hepatic steatosis 03/15/2019  ? Tendinitis of left rotator cuff 01/28/2019  ? Capsulitis of right shoulder 01/28/2019  ? Chronic right shoulder pain 01/21/2019  ? History of hepatitis C 01/21/2019  ? Joint pain in both hands 01/18/2019  ? Need for influenza vaccination 01/18/2019  ? Depression with anxiety 09/15/2018  ? Seasonal allergic rhinitis due to pollen 03/16/2018  ? B12 deficiency 07/16/2017  ? Neuropathy 07/15/2017  ? Spinal stenosis of lumbar region with neurogenic claudication 07/15/2017  ? Elevated cholesterol 07/15/2017  ? Healthcare maintenance 07/15/2017  ? Essential hypertension 07/15/2017  ? Abnormal CBC 07/15/2017  ? Monoclonal paraproteinemia 03/23/2012  ? ?Franz Svec U PT, DPT, PN2  ? ?Supplemental Physical Therapist ?[facility]  ? ? ? ? ? ?[place] ?[place] ?[address] ?[place], [place], [address] ?Phone: [phone]   Fax:  [phone] ? ?Name: Michele Hanson ?MRN: [id number] ?Date of Birth: 02/01/1951 ? ? ? ?

## 2021-07-23 ENCOUNTER — Telehealth: Payer: Self-pay | Admitting: Family Medicine

## 2021-07-23 ENCOUNTER — Ambulatory Visit: Payer: Medicare Other | Admitting: Physical Therapy

## 2021-07-23 ENCOUNTER — Encounter: Payer: Self-pay | Admitting: Physical Therapy

## 2021-07-23 DIAGNOSIS — R600 Localized edema: Secondary | ICD-10-CM

## 2021-07-23 DIAGNOSIS — M6281 Muscle weakness (generalized): Secondary | ICD-10-CM | POA: Diagnosis not present

## 2021-07-23 DIAGNOSIS — R293 Abnormal posture: Secondary | ICD-10-CM

## 2021-07-23 DIAGNOSIS — M25511 Pain in right shoulder: Secondary | ICD-10-CM

## 2021-07-23 DIAGNOSIS — M25611 Stiffness of right shoulder, not elsewhere classified: Secondary | ICD-10-CM

## 2021-07-23 NOTE — Therapy (Signed)
[facility] ?[place] ?[address] ?[place], [place], [address] ?Phone: [phone]   Fax:  [phone] ?Progress Note ?Reporting Period 06/20/21 to 07/24/21 for visit 21-30 ? ?See note below for Objective Data and Assessment of Progress/Goals.  ? ?  ?Physical Therapy Treatment ? ?Patient Details  ?Name: Michele Hanson ?MRN: [id number] ?Date of Birth: Mar 10, 1951 ?Referring Provider (PT): Dax Griffin Basil ? ? ?Encounter Date: 07/23/2021 ? ? PT End of Session - 07/23/21 1341   ? ? Visit Number 30   ? Date for PT Re-Evaluation 07/23/21   ? PT Start Time 1300   ? PT Stop Time 7412   ? PT Time Calculation (min) 45 min   ? Activity Tolerance Patient tolerated treatment well   ? Behavior During Therapy [facility] for tasks assessed/performed   ? ?  ?  ? ?  ? ? ?Past Medical History:  ?Diagnosis Date  ? Anxiety   ? Arthritis   ? Depression   ? Fatty liver disease, nonalcoholic   ? GERD (gastroesophageal reflux disease)   ? Hepatitis 1973  ? hep c-was treated  ? Hyperlipemia   ? Hypertension   ? ? ?Past Surgical History:  ?Procedure Laterality Date  ? BACK SURGERY    ? CARPAL TUNNEL RELEASE Bilateral   ? CHOLECYSTECTOMY    ? DIAGNOSTIC LAPAROSCOPY    ? INCONTINENCE SURGERY    ? LAPAROSCOPIC LYSIS OF ADHESIONS    ? REVERSE SHOULDER ARTHROPLASTY Right 03/20/2021  ? Procedure: REVERSE SHOULDER ARTHROPLASTY;  Surgeon: Hiram Gash, MD;  Location: [facility];  Service: Orthopedics;  Laterality: Right;  ? ? ?There were no vitals filed for this visit. ? ? Subjective Assessment - 07/23/21 1303   ? ? Subjective Arm feel ok, might be a little sore. Having some allergie issues   ? Currently in Pain? No/denies   ? ?  ?  ? ?  ? ? ? ? ? [facility] PT Assessment - 07/23/21 0001   ? ?  ? AROM  ? Right Shoulder Flexion 140 Degrees   ? Right Shoulder ABduction 140 Degrees   ? ?  ?  ? ?  ? ? ? ? ? ? ? ? ? ? ? ? ? ? ? ? [facility] Adult PT Treatment/Exercise - 07/23/21 0001   ? ?  ? Shoulder Exercises: Supine  ?  Flexion Right;20 reps   ? Shoulder Flexion Weight (lbs) 2   ? Other Supine Exercises ER/IR 1lb RUE 2x10   ?  ? Shoulder Exercises: Seated  ? External Rotation Right;20 reps;Strengthening   min assist  ?  ? Shoulder Exercises: Standing  ? Horizontal ABduction Strengthening;Both;20 reps;Theraband   ? Flexion Strengthening;Both;20 reps;Theraband   ? Shoulder Flexion Weight (lbs) 2   ? ABduction Strengthening;Both;20 reps   ? Shoulder ABduction Weight (lbs) 2   ? Other Standing Exercises 3 level cabinet reaches 2lb flex, 1lb and   ?  ? Shoulder Exercises: ROM/Strengthening  ? UBE (Upper Arm Bike) L2 x 2 min each   ? Nustep L4 x 4 min   ?  ? Shoulder Exercises: Power Tower  ? Other Power Engineer, water rows 25# 2x15, lats 25# 2x15   ?  ? Manual Therapy  ? Manual Therapy Soft tissue mobilization   ? Soft tissue mobilization STM R deltoid, bicep tricep   ? ?  ?  ? ?  ? ? ? ? ? ? ? ? ? ? ? ? PT Short  Term Goals - 06/11/21 1330   ? ?  ? PT SHORT TERM GOAL #1  ? Title Will be compliant with appropriate progressive HEP   ? Baseline 2/28- not compliant with daily HEP   ? Time 4   ? Period Weeks   ? Status Achieved   ?  ? PT SHORT TERM GOAL #2  ? Title R shoulder PROM and AAROM to be at least 120 degrees flexion and abduction, 45 degrees ER   ? Time 4   ? Period Weeks   ? Status Partially Met   ?  ? PT SHORT TERM GOAL #3  ? Title Pain to be no more than 4/10 at worst   ? Baseline 2/28- met   ? Time 4   ? Period Weeks   ? Status Achieved   ?  ? PT SHORT TERM GOAL #4  ? Title Soft tissue restrictions in upper traps and biceps/anterior shoulder to be resolved in order to improve pain and ROM   ? Baseline 2/28- much improved, some restrictions still sticking around   ? Time 4   ? Period Weeks   ? Status Partially Met   ? ?  ?  ? ?  ? ? ? ? PT Long Term Goals - 07/23/21 1341   ? ?  ? PT LONG TERM GOAL #1  ? Title AROM R shoulder to be at least 150 degrees flexion and ABD without pain   ? Status On-going   ?  ? PT LONG TERM GOAL #2   ? Title MMT in R shoulder flexion and abduction to be at least 4/5   ? Status On-going   ?  ? PT LONG TERM GOAL #3  ? Title Will tolerate initiation of active IR/extension ROM and strengthening R shoulder at 12 weeks as per protocol   ? Status Partially Met   ?  ? PT LONG TERM GOAL #4  ? Title Will be able to dress and perform self hygiene with R UE without increased pain   ? Status Partially Met   ? ?  ?  ? ?  ? ? ? ? ? ? ? ? Plan - 07/23/21 1343   ? ? Clinical Impression Statement Continues focus on functional strength and ROM. Some postural compensation noted with seated rows, lat pull downs, and horizontal abduction. R shoulder ER remains very weak. Pain reported with passive R shoulder ER. Difficult with three level cabinet reaches using resistance.   ? Personal Factors and Comorbidities Past/Current Experience;Comorbidity 3+;Time since onset of injury/illness/exacerbation   ? Examination-Activity Limitations Bathing;Bed Mobility;Reach Overhead;Self Feeding;Caring for Others;Sleep;Carry;Dressing;Hygiene/Grooming;Lift;Toileting   ? Examination-Participation Restrictions Meal Prep;Cleaning;Community Activity;Driving;Interpersonal Relationship;Shop;Laundry;Yard Work;Medication Management   ? Stability/Clinical Decision Making Stable/Uncomplicated   ? Rehab Potential Good   ? PT Frequency 2x / week   ? PT Duration 6 weeks   ? PT Treatment/Interventions ADLs/Self Care Home Management;Biofeedback;Cryotherapy;Electrical Stimulation;Iontophoresis 87m/ml Dexamethasone;Moist Heat;Ultrasound;Functional mobility training;Therapeutic activities;Therapeutic exercise;Patient/family education;Manual techniques;Scar mobilization;Passive range of motion;Taping;Vasopneumatic Device   ? PT Next Visit Plan needs more DN to RUE, needs to continue PT for strengthening   ? ?  ?  ? ?  ? ? ?Patient will benefit from skilled therapeutic intervention in order to improve the following deficits and impairments:  Decreased range of motion,  Increased fascial restricitons, Increased muscle spasms, Impaired UE functional use, Decreased activity tolerance, Decreased knowledge of precautions, Impaired perceived functional ability, Pain, Decreased scar mobility, Hypomobility, Improper body mechanics, Decreased strength, Postural dysfunction ? ?Visit  Diagnosis: ?Muscle weakness (generalized) ? ?Acute pain of right shoulder ? ?Abnormal posture ? ?Localized edema ? ?Stiffness of right shoulder, not elsewhere classified ? ? ? ? ?Problem List ?Patient Active Problem List  ? Diagnosis Date Noted  ? Alcohol use 07/02/2021  ? De Quervain's tenosynovitis, right 07/22/2019  ? Abrasion of right knee 03/28/2019  ? Contusion of right chest wall 03/28/2019  ? Sprain of right wrist 03/28/2019  ? Bilateral carpal tunnel syndrome 03/17/2019  ? Hepatic steatosis 03/15/2019  ? Tendinitis of left rotator cuff 01/28/2019  ? Capsulitis of right shoulder 01/28/2019  ? Chronic right shoulder pain 01/21/2019  ? History of hepatitis C 01/21/2019  ? Joint pain in both hands 01/18/2019  ? Need for influenza vaccination 01/18/2019  ? Depression with anxiety 09/15/2018  ? Seasonal allergic rhinitis due to pollen 03/16/2018  ? B12 deficiency 07/16/2017  ? Neuropathy 07/15/2017  ? Spinal stenosis of lumbar region with neurogenic claudication 07/15/2017  ? Elevated cholesterol 07/15/2017  ? Healthcare maintenance 07/15/2017  ? Essential hypertension 07/15/2017  ? Abnormal CBC 07/15/2017  ? Monoclonal paraproteinemia 03/23/2012  ? ? ?Scot Jun, PTA ?07/23/2021, 1:46 PM ? ?[facility] ?[place] ?[address] ?[place], [place], [address] ?Phone: [phone]   Fax:  [phone] ? ?Name: Joury Allcorn ?MRN: [id number] ?Date of Birth: 07-06-1950 ? ? ? ?

## 2021-07-23 NOTE — Telephone Encounter (Signed)
Left message for patient to call back and schedule Medicare Annual Wellness Visit (AWV).   Please offer to do virtually or by telephone.  Left office number and my jabber #[phone].  Last AWV:01/31/2020  Please schedule at anytime with Nurse Health Advisor.   

## 2021-07-25 ENCOUNTER — Ambulatory Visit: Payer: Medicare Other | Admitting: Physical Therapy

## 2021-07-25 LAB — HM DEXA SCAN

## 2021-07-30 ENCOUNTER — Encounter: Payer: Self-pay | Admitting: Family Medicine

## 2021-07-30 ENCOUNTER — Encounter: Payer: Self-pay | Admitting: Physical Therapy

## 2021-07-30 ENCOUNTER — Ambulatory Visit: Payer: Medicare Other | Admitting: Physical Therapy

## 2021-07-30 DIAGNOSIS — M25611 Stiffness of right shoulder, not elsewhere classified: Secondary | ICD-10-CM

## 2021-07-30 DIAGNOSIS — M6281 Muscle weakness (generalized): Secondary | ICD-10-CM

## 2021-07-30 DIAGNOSIS — M25511 Pain in right shoulder: Secondary | ICD-10-CM

## 2021-07-30 DIAGNOSIS — R600 Localized edema: Secondary | ICD-10-CM

## 2021-07-30 DIAGNOSIS — R293 Abnormal posture: Secondary | ICD-10-CM

## 2021-07-30 NOTE — Therapy (Signed)
[facility] ?[place] ?[address] ?[place], [place], [address] ?Phone: [phone]   Fax:  [phone] ? ?Physical Therapy Treatment ? ?Patient Details  ?Name: Michele Hanson ?MRN: [id number] ?Date of Birth: Jul 01, 1950 ?Referring Provider (PT): Dax Griffin Basil ? ? ?Encounter Date: 07/30/2021 ? ? PT End of Session - 07/30/21 1407   ? ? Visit Number 31   ? Number of Visits 37   ? Date for PT Re-Evaluation 09/10/21   ? Authorization Type [facility] MCR   ? Authorization Time Period 04/17/21 to 06/12/21; extended to 4/11; re-extended to 5/30   ? Progress Note Due on Visit 38   ? PT Start Time 1275   arrived late  ? PT Stop Time 1356   ? PT Time Calculation (min) 34 min   ? Activity Tolerance Patient tolerated treatment well   ? Behavior During Therapy [facility] for tasks assessed/performed   ? ?  ?  ? ?  ? ? ?Past Medical History:  ?Diagnosis Date  ? Anxiety   ? Arthritis   ? Depression   ? Fatty liver disease, nonalcoholic   ? GERD (gastroesophageal reflux disease)   ? Hepatitis 1973  ? hep c-was treated  ? Hyperlipemia   ? Hypertension   ? ? ?Past Surgical History:  ?Procedure Laterality Date  ? BACK SURGERY    ? CARPAL TUNNEL RELEASE Bilateral   ? CHOLECYSTECTOMY    ? DIAGNOSTIC LAPAROSCOPY    ? INCONTINENCE SURGERY    ? LAPAROSCOPIC LYSIS OF ADHESIONS    ? REVERSE SHOULDER ARTHROPLASTY Right 03/20/2021  ? Procedure: REVERSE SHOULDER ARTHROPLASTY;  Surgeon: Hiram Gash, MD;  Location: [facility];  Service: Orthopedics;  Laterality: Right;  ? ? ?There were no vitals filed for this visit. ? ? Subjective Assessment - 07/30/21 1324   ? ? Subjective My shoulder feels OK no pain, my leg is hurting though. I washed windows this weekend and my arm did OK. I still can't lift a plate and put in on a shelf, I can't do that without having to use both hands.   ? Patient Stated Goals would like to get arm back to normal- as much as I can after the procedure   ? Currently in Pain? No/denies    ? ?  ?  ? ?  ? ? ? ? ? [facility] PT Assessment - 07/30/21 0001   ? ?  ? Assessment  ? Medical Diagnosis s/p reverse total shoulder   ? Referring Provider (PT) Ophelia Charter   ? [place] Date/Surgical Date 03/20/21   ? Next MD Visit Dr. Griffin Basil May 2023   ? Prior Therapy PT in the past for her back, shoulder, foot   ?  ? Precautions  ? Precautions Shoulder   ? Precaution Comments [name] protocol in chart under media   ?  ? Restrictions  ? Weight Bearing Restrictions No   ?  ? Balance Screen  ? Has the patient fallen in the past 6 months No   ? How many times? no falls but lots of almost falls   ? Has the patient had a decrease in activity level because of a fear of falling?  No   ? Is the patient reluctant to leave their home because of a fear of falling?  No   ?  ? Home Environment  ? Living Environment Private residence   ?  ? Prior Function  ? Level of Independence Independent;Independent with basic  ADLs   ? Vocation Retired   ? Leisure water aerobics, pilates, swimming   ?  ? Observation/Other Assessments  ? Focus on Therapeutic Outcomes (FOTO)  2   ?  ? AROM  ? Right Shoulder Flexion 140 Degrees   ? Right Shoulder ABduction 150 Degrees   ? Right Shoulder External Rotation 13 Degrees   ?  ? Strength  ? Right Shoulder Flexion 4+/5   ? Right Shoulder Extension 4/5   ? Right Shoulder ABduction 4+/5   ? Right Elbow Flexion 4/5   ? Right Elbow Extension 4+/5   ? ?  ?  ? ?  ? ? ? ? ? ? ? ? ? ? ? ? ? ? ? ? [facility] Adult PT Treatment/Exercise - 07/30/21 0001   ? ?  ? Shoulder Exercises: Seated  ? Other Seated Exercises UBE L4 43min forward/3 min backward   ?  ? Shoulder Exercises: Standing  ? Other Standing Exercises 2 level cabinet taps 2# weight x5 each   ? Other Standing Exercises shoulder stability flexion; 3 way shoulder satbility x10 B red TB on wall   ? ?  ?  ? ?  ? ? ? ? ? ? ? ? ? ? PT Education - 07/30/21 1406   ? ? Education Details POC moving forward, possible anatomical limitations regarding shoulder ROM   ?  Person(s) Educated Patient   ? Methods Explanation   ? Comprehension Verbalized understanding   ? ?  ?  ? ?  ? ? ? PT Short Term Goals - 07/30/21 1334   ? ?  ? PT SHORT TERM GOAL #1  ? Title Will be compliant with appropriate progressive HEP   ? Baseline 4/17- trying to do them every day   ? Time 4   ? Period Weeks   ? Status Achieved   ?  ? PT SHORT TERM GOAL #2  ? Title R shoulder PROM and AAROM to be at least 120 degrees flexion and abduction, 45 degrees ER   ? Baseline 4/17- ER is staying tight   ? Time 4   ? Period Weeks   ? Status Partially Met   ?  ? PT SHORT TERM GOAL #3  ? Title Pain to be no more than 4/10 at worst   ? Time 4   ? Period Weeks   ? Status Achieved   ?  ? PT SHORT TERM GOAL #4  ? Title Soft tissue restrictions in upper traps and biceps/anterior shoulder to be resolved in order to improve pain and ROM   ? Baseline 4/17- come and go   ? Time 4   ? Period Weeks   ? Status Partially Met   ? ?  ?  ? ?  ? ? ? ? PT Long Term Goals - 07/30/21 1335   ? ?  ? PT LONG TERM GOAL #1  ? Title AROM R shoulder to be at least 150 degrees flexion and ABD without pain   ? Baseline 4/17- flexion140 abd 150   ? Time 8   ? Period Weeks   ? Status Partially Met   ?  ? PT LONG TERM GOAL #2  ? Title MMT in R shoulder flexion and abduction to be at least 4/5   ? Time 8   ? Period Weeks   ? Status Achieved   ?  ? PT LONG TERM GOAL #3  ? Title Will tolerate initiation of active IR/extension ROM and  strengthening R shoulder at 12 weeks as per protocol   ? Baseline 4/17- extension OK, IR still limited   ? Time 8   ? Period Weeks   ? Status Partially Met   ?  ? PT LONG TERM GOAL #4  ? Title Will be able to dress and perform self hygiene with R UE without increased pain   ? Baseline 4/17- no issues   ? Time 8   ? Period Weeks   ? Status Achieved   ? ?  ?  ? ?  ? ? ? ? ? ? ? ? Plan - 07/30/21 1407   ? ? Clinical Impression Statement [name] arrives today doing OK thought her appointment was today, fortunately I had an opening  and was able to see her. Got new objective measures- she still is having a hard time with IR/ER ROM as well as strength and scapular mm control to be able to do things like lift plates up into cabinets. Will plan to extend her an additional 6 weeks at reduced frequency to help address remaining impairments, I do anticipate that we may need to DC at the end of this period however.   ? Personal Factors and Comorbidities Past/Current Experience;Comorbidity 3+;Time since onset of injury/illness/exacerbation   ? Examination-Activity Limitations Bathing;Bed Mobility;Reach Overhead;Self Feeding;Caring for Others;Sleep;Carry;Dressing;Hygiene/Grooming;Lift;Toileting   ? Examination-Participation Restrictions Meal Prep;Cleaning;Community Activity;Driving;Interpersonal Relationship;Shop;Laundry;Yard Work;Medication Management   ? Stability/Clinical Decision Making Stable/Uncomplicated   ? Clinical Decision Making Low   ? Rehab Potential Good   ? PT Frequency 2x / week   ? PT Duration 6 weeks   ? PT Treatment/Interventions ADLs/Self Care Home Management;Biofeedback;Cryotherapy;Electrical Stimulation;Iontophoresis 4mg /ml Dexamethasone;Moist Heat;Ultrasound;Functional mobility training;Therapeutic activities;Therapeutic exercise;Patient/family education;Manual techniques;Scar mobilization;Passive range of motion;Taping;Vasopneumatic Device   ? PT Next Visit Plan needs more DN to RUE, needs to continue PT for strengthening; periscapular strength and control   ? PT Home Exercise Plan [id number]   ? Consulted and Agree with Plan of Care Patient   ? ?  ?  ? ?  ? ? ?Patient will benefit from skilled therapeutic intervention in order to improve the following deficits and impairments:  Decreased range of motion, Increased fascial restricitons, Increased muscle spasms, Impaired UE functional use, Decreased activity tolerance, Decreased knowledge of precautions, Impaired perceived functional ability, Pain, Decreased scar mobility,  Hypomobility, Improper body mechanics, Decreased strength, Postural dysfunction ? ?Visit Diagnosis: ?Muscle weakness (generalized) ? ?Acute pain of right shoulder ? ?Abnormal posture ? ?Localized edema ? ?Stiffness o

## 2021-07-31 ENCOUNTER — Ambulatory Visit: Payer: Medicare Other | Admitting: Physical Therapy

## 2021-08-07 ENCOUNTER — Ambulatory Visit (INDEPENDENT_AMBULATORY_CARE_PROVIDER_SITE_OTHER): Payer: Medicare Other

## 2021-08-07 DIAGNOSIS — Z0000 Encounter for general adult medical examination without abnormal findings: Secondary | ICD-10-CM

## 2021-08-07 NOTE — Progress Notes (Signed)
? ?Subjective:  ? Michele Hanson is a 71 y.o. female who presents for Medicare Annual (Subsequent) preventive examination. ? ? ?I connected with Michele Hanson today by telephone and verified that I am speaking with the correct person using two identifiers. ?Location patient: home ?Location provider: work ?Persons participating in the virtual visit: patient, provider. ?  ?I discussed the limitations, risks, security and privacy concerns of performing an evaluation and management service by telephone and the availability of in person appointments. I also discussed with the patient that there may be a patient responsible charge related to this service. The patient expressed understanding and verbally consented to this telephonic visit.  ?  ?Interactive audio and video telecommunications were attempted between this provider and patient, however failed, due to patient having technical difficulties OR patient did not have access to video capability.  We continued and completed visit with audio only. ? ?  ?Review of Systems    ? ?Cardiac Risk Factors include: advanced age (>19mn, >>68women) ? ?   ?Objective:  ?  ?Today's Vitals  ? ?There is no height or weight on file to calculate BMI. ? ? ?  08/07/2021  ?  3:02 PM 04/17/2021  ? 11:14 AM 03/20/2021  ?  7:05 AM 03/12/2021  ?  2:07 PM 01/31/2020  ? 11:22 AM 01/26/2019  ?  8:55 AM  ?Advanced Directives  ?Does Patient Have a Medical Advance Directive? Yes Yes Yes Yes No No  ?Type of [place]  Living will;Healthcare Power of Attorney    ?Does patient want to make changes to medical advance directive?  No - Patient declined No - Patient declined     ?Copy [place]? No - copy requested       ?Would patient like information on creating a medical advance directive?     No - Patient declined No - Patient declined  ? ? ?Current Medications (verified) ?Outpatient  Encounter Medications as of 08/07/2021  ?Medication Sig  ? atorvastatin (LIPITOR) 20 MG tablet TAKE ONE TABLET BY MOUTH ONE TIME DAILY  ? azelastine (ASTELIN) 0.1 % nasal spray USE ONE SPRAY INTO BOTH NOSTRILS AS NEEDED  ? denosumab (PROLIA) 60 MG/ML SOSY injection Inject 60 mg into the skin every 6 (six) months.  ? estradiol (ESTRACE) 0.1 MG/GM vaginal cream   ? fluticasone (FLONASE) 50 MCG/ACT nasal spray USE ONE SPRAY IN EACH NOSTRIL DAILY **SHAKE BEFORE USING**  ? gabapentin (NEURONTIN) 300 MG capsule TAKE ONE CAPSULE BY MOUTH TWICE A DAY  ? lisinopril-hydrochlorothiazide (ZESTORETIC) 20-12.5 MG tablet TAKE ONE TABLET BY MOUTH ONE TIME DAILY  ? meloxicam (MOBIC) 15 MG tablet Take 1 tablet (15 mg total) by mouth daily. For 2 weeks for pain and inflammation. Then take as needed  ? methocarbamol (ROBAXIN) 500 MG tablet TAKE ONE TABLET BY MOUTH EVERY 8 HOURS AS NEEDED FOR MUSCLE SPASMS  ? omeprazole (PRILOSEC) 20 MG capsule Take 1 capsule (20 mg total) by mouth daily.  ? Vitamin D, Ergocalciferol, (DRISDOL) 1.25 MG (50000 UNIT) CAPS capsule Take 50,000 Units by mouth every 7 (seven) days.  ? ?No facility-administered encounter medications on file as of 08/07/2021.  ? ? ?Allergies (verified) ?Ciprofloxacin, Neomycin, and Nickel  ? ?History: ?Past Medical History:  ?Diagnosis Date  ? Anxiety   ? Arthritis   ? Depression   ? Fatty liver disease, nonalcoholic   ? GERD (gastroesophageal reflux disease)   ?  Hepatitis 1973  ? hep c-was treated  ? Hyperlipemia   ? Hypertension   ? ?Past Surgical History:  ?Procedure Laterality Date  ? BACK SURGERY    ? CARPAL TUNNEL RELEASE Bilateral   ? CHOLECYSTECTOMY    ? DIAGNOSTIC LAPAROSCOPY    ? INCONTINENCE SURGERY    ? LAPAROSCOPIC LYSIS OF ADHESIONS    ? REVERSE SHOULDER ARTHROPLASTY Right 03/20/2021  ? Procedure: REVERSE SHOULDER ARTHROPLASTY;  Surgeon: Hiram Gash, MD;  Location: [facility];  Service: Orthopedics;  Laterality: Right;  ? ?Family History  ?Family  history unknown: Yes  ? ?Social History  ? ?Socioeconomic History  ? Marital status: Married  ?  Spouse name: Not on file  ? Number of children: Not on file  ? Years of education: Not on file  ? Highest education level: Not on file  ?Occupational History  ? Not on file  ?Tobacco Use  ? Smoking status: Never  ? Smokeless tobacco: Never  ?Vaping Use  ? Vaping Use: Never used  ?Substance and Sexual Activity  ? Alcohol use: Yes  ?  Comment: 1-2 alcoholic drinks daily  ? Drug use: Never  ? Sexual activity: Yes  ?  Birth control/protection: Post-menopausal  ?Other Topics Concern  ? Not on file  ?Social History Narrative  ? Not on file  ? ?Social Determinants of Health  ? ?Financial Resource Strain: Low Risk   ? Difficulty of Paying Living Expenses: Not hard at all  ?Food Insecurity: No Food Insecurity  ? Worried About Charity fundraiser in the Last Year: Never true  ? Ran Out of Food in the Last Year: Never true  ?Transportation Needs: No Transportation Needs  ? Lack of Transportation (Medical): No  ? Lack of Transportation (Non-Medical): No  ?Physical Activity: Sufficiently Active  ? Days of Exercise per Week: 5 days  ? Minutes of Exercise per Session: 40 min  ?Stress: No Stress Concern Present  ? Feeling of Stress : Not at all  ?Social Connections: Moderately Integrated  ? Frequency of Communication with Friends and Family: Three times a week  ? Frequency of Social Gatherings with Friends and Family: Three times a week  ? Attends Religious Services: Never  ? Active Member of Clubs or Organizations: Yes  ? Attends Archivist Meetings: More than 4 times per year  ? Marital Status: Married  ? ? ?Tobacco Counseling ?Counseling given: Not Answered ? ? ?Clinical Intake: ? ?Pre-visit preparation completed: Yes ? ?Pain : No/denies pain ? ?  ? ?Nutritional Risks: None ?Diabetes: No ? ?How often do you need to have someone help you when you read instructions, pamphlets, or other written materials from your doctor or  pharmacy?: 1 - Never ?What is the last grade level you completed in school?: masters ? ?Diabetic?no  ? ?Interpreter Needed?: No ? ?Information entered by :: [email or web address] ? ? ?Activities of Daily Living ? ?  08/07/2021  ?  3:07 PM 03/20/2021  ?  7:13 AM  ?In your present state of health, do you have any difficulty performing the following activities:  ?Hearing? 0 0  ?Vision? 0 0  ?Difficulty concentrating or making decisions? 0 0  ?Walking or climbing stairs? 0 0  ?Dressing or bathing? 0 0  ?Doing errands, shopping? 0   ?Preparing Food and eating ? N   ?Using the Toilet? N   ?In the past six months, have you accidently leaked urine? N   ?Do you have problems with  loss of bowel control? N   ?Managing your Medications? N   ?Managing your Finances? N   ?Housekeeping or managing your Housekeeping? N   ? ? ?Patient Care Team: ?Libby Maw, MD as PCP - General (Family Medicine) ? ?Indicate any recent Medical Services you may have received from other than [facility] providers in the past year (date may be approximate). ? ?   ?Assessment:  ? This is a routine wellness examination for Michele Hanson. ? ?Hearing/Vision screen ?Vision Screening - Comments:: Annual eye exams wears glasses /contacts  ? ?Dietary issues and exercise activities discussed: ?Current Exercise Habits: Home exercise routine, Type of exercise: walking, Time (Minutes): 30, Frequency (Times/Week): 3, Weekly Exercise (Minutes/Week): 90, Intensity: Mild, Exercise limited by: None identified ? ? Goals Addressed   ? ?  ?  ?  ?  ? This Visit's Progress  ?  Patient Stated   On track  ?  Maintain healthy active lifestyle. ? ?  ? ?  ? ?Depression Screen ? ?  08/07/2021  ?  3:03 PM 08/07/2021  ?  3:00 PM 07/02/2021  ? 11:15 AM 12/27/2020  ? 11:02 AM 12/27/2020  ? 10:10 AM 12/13/2020  ?  9:35 AM 01/31/2020  ? 11:25 AM  ?PHQ 2/9 Scores  ?PHQ - 2 Score 0 0 0 0 0 0 0  ?PHQ- 9 Score    1     ?  ?Fall Risk ? ?  08/07/2021  ?  3:03 PM 07/02/2021  ? 11:14 AM 12/27/2020  ? 10:10 AM  12/13/2020  ?  9:36 AM 01/31/2020  ? 11:23 AM  ?Fall Risk   ?Falls in the past year? 0 1 0 1 1  ?Comment   dog caused a fall    ?Number falls in past yr: 0 0  1 1  ?Injury with Fall? 0 0  0 0  ?Risk for fall due to :

## 2021-08-07 NOTE — Patient Instructions (Signed)
Michele Hanson , ?Thank you for taking time to come for your Medicare Wellness Visit. I appreciate your ongoing commitment to your health goals. Please review the following plan we discussed and let me know if I can assist you in the future.  ? ?Screening recommendations/referrals: ?Colonoscopy: 02/17/2013  due 2024 ?Mammogram: 08/26/2020 ?Bone Density: 07/25/2021 ?Recommended yearly ophthalmology/optometry visit for glaucoma screening and checkup ?Recommended yearly dental visit for hygiene and checkup ? ?Vaccinations: ?Influenza vaccine: completed  ?Pneumococcal vaccine: due pt states had a [employer] will have sent to PCP  ?Tdap vaccine: 04/13/2018 ?Shingles vaccine: completed  ? ?Advanced directives: yes  ? ?Conditions/risks identified: none  ? ?Next appointment: none  ? ? ?Preventive Care 71 Years and Older, Female ?Preventive care refers to lifestyle choices and visits with your health care provider that can promote health and wellness. ?What does preventive care include? ?A yearly physical exam. This is also called an annual well check. ?Dental exams once or twice a year. ?Routine eye exams. Ask your health care provider how often you should have your eyes checked. ?Personal lifestyle choices, including: ?Daily care of your teeth and gums. ?Regular physical activity. ?Eating a healthy diet. ?Avoiding tobacco and drug use. ?Limiting alcohol use. ?Practicing safe sex. ?Taking low-dose aspirin every day. ?Taking vitamin and mineral supplements as recommended by your health care provider. ?What happens during an annual well check? ?The services and screenings done by your health care provider during your annual well check will depend on your age, overall health, lifestyle risk factors, and family history of disease. ?Counseling  ?Your health care provider may ask you questions about your: ?Alcohol use. ?Tobacco use. ?Drug use. ?Emotional well-being. ?Home and relationship well-being. ?Sexual activity. ?Eating  habits. ?History of falls. ?Memory and ability to understand (cognition). ?Work and work Statistician. ?Reproductive health. ?Screening  ?You may have the following tests or measurements: ?Height, weight, and BMI. ?Blood pressure. ?Lipid and cholesterol levels. These may be checked every 5 years, or more frequently if you are over 29 years old. ?Skin check. ?Lung cancer screening. You may have this screening every year starting at age 71 if you have a 30-pack-year history of smoking and currently smoke or have quit within the past 15 years. ?Fecal occult blood test (FOBT) of the stool. You may have this test every year starting at age 3. ?Flexible sigmoidoscopy or colonoscopy. You may have a sigmoidoscopy every 5 years or a colonoscopy every 10 years starting at age 71. ?Hepatitis C blood test. ?Hepatitis B blood test. ?Sexually transmitted disease (STD) testing. ?Diabetes screening. This is done by checking your blood sugar (glucose) after you have not eaten for a while (fasting). You may have this done every 1-3 years. ?Bone density scan. This is done to screen for osteoporosis. You may have this done starting at age 71. ?Mammogram. This may be done every 1-2 years. Talk to your health care provider about how often you should have regular mammograms. ?Talk with your health care provider about your test results, treatment options, and if necessary, the need for more tests. ?Vaccines  ?Your health care provider may recommend certain vaccines, such as: ?Influenza vaccine. This is recommended every year. ?Tetanus, diphtheria, and acellular pertussis (Tdap, Td) vaccine. You may need a Td booster every 10 years. ?Zoster vaccine. You may need this after age 64. ?Pneumococcal 13-valent conjugate (PCV13) vaccine. One dose is recommended after age 71. ?Pneumococcal polysaccharide (PPSV23) vaccine. One dose is recommended after age 71. ?Talk to your health care provider  about which screenings and vaccines you need and how  often you need them. ?This information is not intended to replace advice given to you by your health care provider. Make sure you discuss any questions you have with your health care provider. ?Document Released: 04/27/2015 Document Revised: 12/19/2015 Document Reviewed: 01/30/2015 ?Elsevier Interactive Patient Education ? 2017 [place]. ? ?Fall Prevention in the Home ?Falls can cause injuries. They can happen to people of all ages. There are many things you can do to make your home safe and to help prevent falls. ?What can I do on the outside of my home? ?Regularly fix the edges of walkways and driveways and fix any cracks. ?Remove anything that might make you trip as you walk through a door, such as a raised step or threshold. ?Trim any bushes or trees on the path to your home. ?Use bright outdoor lighting. ?Clear any walking paths of anything that might make someone trip, such as rocks or tools. ?Regularly check to see if handrails are loose or broken. Make sure that both sides of any steps have handrails. ?Any raised decks and porches should have guardrails on the edges. ?Have any leaves, snow, or ice cleared regularly. ?Use sand or salt on walking paths during winter. ?Clean up any spills in your garage right away. This includes oil or grease spills. ?What can I do in the bathroom? ?Use night lights. ?Install grab bars by the toilet and in the tub and shower. Do not use towel bars as grab bars. ?Use non-skid mats or decals in the tub or shower. ?If you need to sit down in the shower, use a plastic, non-slip stool. ?Keep the floor dry. Clean up any water that spills on the floor as soon as it happens. ?Remove soap buildup in the tub or shower regularly. ?Attach bath mats securely with double-sided non-slip rug tape. ?Do not have throw rugs and other things on the floor that can make you trip. ?What can I do in the bedroom? ?Use night lights. ?Make sure that you have a light by your bed that is easy to  reach. ?Do not use any sheets or blankets that are too big for your bed. They should not hang down onto the floor. ?Have a firm chair that has side arms. You can use this for support while you get dressed. ?Do not have throw rugs and other things on the floor that can make you trip. ?What can I do in the kitchen? ?Clean up any spills right away. ?Avoid walking on wet floors. ?Keep items that you use a lot in easy-to-reach places. ?If you need to reach something above you, use a strong step stool that has a grab bar. ?Keep electrical cords out of the way. ?Do not use floor polish or wax that makes floors slippery. If you must use wax, use non-skid floor wax. ?Do not have throw rugs and other things on the floor that can make you trip. ?What can I do with my stairs? ?Do not leave any items on the stairs. ?Make sure that there are handrails on both sides of the stairs and use them. Fix handrails that are broken or loose. Make sure that handrails are as long as the stairways. ?Check any carpeting to make sure that it is firmly attached to the stairs. Fix any carpet that is loose or worn. ?Avoid having throw rugs at the top or bottom of the stairs. If you do have throw rugs, attach them to the floor  with carpet tape. ?Make sure that you have a light switch at the top of the stairs and the bottom of the stairs. If you do not have them, ask someone to add them for you. ?What else can I do to help prevent falls? ?Wear shoes that: ?Do not have high heels. ?Have rubber bottoms. ?Are comfortable and fit you well. ?Are closed at the toe. Do not wear sandals. ?If you use a stepladder: ?Make sure that it is fully opened. Do not climb a closed stepladder. ?Make sure that both sides of the stepladder are locked into place. ?Ask someone to hold it for you, if possible. ?Clearly mark and make sure that you can see: ?Any grab bars or handrails. ?First and last steps. ?Where the edge of each step is. ?Use tools that help you move  around (mobility aids) if they are needed. These include: ?Canes. ?Walkers. ?Scooters. ?Crutches. ?Turn on the lights when you go into a dark area. Replace any light bulbs as soon as they burn out. ?Set up your furniture so

## 2021-08-12 ENCOUNTER — Ambulatory Visit: Payer: Medicare Other | Attending: Orthopaedic Surgery | Admitting: Physical Therapy

## 2021-08-12 ENCOUNTER — Encounter: Payer: Self-pay | Admitting: Physical Therapy

## 2021-08-12 DIAGNOSIS — M6281 Muscle weakness (generalized): Secondary | ICD-10-CM | POA: Diagnosis present

## 2021-08-12 DIAGNOSIS — R293 Abnormal posture: Secondary | ICD-10-CM

## 2021-08-12 DIAGNOSIS — R600 Localized edema: Secondary | ICD-10-CM

## 2021-08-12 DIAGNOSIS — M25511 Pain in right shoulder: Secondary | ICD-10-CM

## 2021-08-12 DIAGNOSIS — M25611 Stiffness of right shoulder, not elsewhere classified: Secondary | ICD-10-CM | POA: Diagnosis present

## 2021-08-12 NOTE — Therapy (Signed)
[facility] ?[address] ?[place], [place], [address] ?Phone: [phone]   Fax:  [phone] ? ?Physical Therapy Treatment ? ?Patient Details  ?Name: Michele Hanson ?MRN: [id number] ?Date of Birth: 1950-07-31 ?Referring Provider (PT): Dax Griffin Basil ? ? ?Encounter Date: 08/12/2021 ? ? PT End of Session - 08/12/21 1152   ? ? Visit Number 32   ? Number of Visits 37   ? Date for PT Re-Evaluation 09/10/21   ? Authorization Type [facility] MCR   ? Authorization Time Period 04/17/21 to 06/12/21; extended to 4/11; re-extended to 5/30   ? Progress Note Due on Visit 38   ? PT Start Time 1102   ? PT Stop Time 1141   ? PT Time Calculation (min) 39 min   ? Activity Tolerance Patient tolerated treatment well   ? Behavior During Therapy [facility] for tasks assessed/performed   ? ?  ?  ? ?  ? ? ?Past Medical History:  ?Diagnosis Date  ? Anxiety   ? Arthritis   ? Depression   ? Fatty liver disease, nonalcoholic   ? GERD (gastroesophageal reflux disease)   ? Hepatitis 1973  ? hep c-was treated  ? Hyperlipemia   ? Hypertension   ? ? ?Past Surgical History:  ?Procedure Laterality Date  ? BACK SURGERY    ? CARPAL TUNNEL RELEASE Bilateral   ? CHOLECYSTECTOMY    ? DIAGNOSTIC LAPAROSCOPY    ? INCONTINENCE SURGERY    ? LAPAROSCOPIC LYSIS OF ADHESIONS    ? REVERSE SHOULDER ARTHROPLASTY Right 03/20/2021  ? Procedure: REVERSE SHOULDER ARTHROPLASTY;  Surgeon: Hiram Gash, MD;  Location: [place];  Service: Orthopedics;  Laterality: Right;  ? ? ?There were no vitals filed for this visit. ? ? Subjective Assessment - 08/12/21 1104   ? ? Subjective My shoulder has been feeling OK I've been trying to work it hard to get more motion but its OK   ? Patient Stated Goals would like to get arm back to normal- as much as I can after the procedure   ? Currently in Pain? Yes   ? Pain Score 1    ? Pain Location Shoulder   ? Pain Orientation Right   ? Pain Descriptors / Indicators Sore;Aching   ? Pain Type  Chronic pain   ? ?  ?  ? ?  ? ? ? ? ? ? ? ? ? ? ? ? ? ? ? ? ? ? ? ? [place] Adult PT Treatment/Exercise - 08/12/21 0001   ? ?  ? Shoulder Exercises: Supine  ? Flexion Right;15 reps;Weights   ? Shoulder Flexion Weight (lbs) 3   ? Flexion Limitations first set 3# 15, second set 4# x10   ? Other Supine Exercises ER stretching 6x15 seconds at up to 90 degrees shoulder ABD   ?  ? Shoulder Exercises: Seated  ? Other Seated Exercises UBE L4.5 65min forward/3 min backward   ?  ? Shoulder Exercises: Prone  ? Extension Right;10 reps;Weights   ? Extension Weight (lbs) 2   ? Extension Limitations 2 sets   ?  ? Shoulder Exercises: Sidelying  ? ABduction Strengthening;Weights   ? ABduction Weight (lbs) 3   ? ABduction Limitations first set 3# x15, 4# x10   ?  ? Shoulder Exercises: Standing  ? Flexion Strengthening;10 reps;Right   2 sets of 10 with back on wall to reduce compensations; fatigues very quickly  ? Shoulder Flexion Weight (lbs)  2   ? Other Standing Exercises biceps stretch on wall 3x30 seconds; wall pushups 1x15   ? Other Standing Exercises shoulder stability flexion; 3 way shoulder satbility x10 B red TB on wall   ? ?  ?  ? ?  ? ? ? ? ? ? ? ? ? ? PT Education - 08/12/21 1152   ? ? Education Details exercise form   ? Person(s) Educated Patient   ? Methods Explanation   ? Comprehension Verbalized understanding   ? ?  ?  ? ?  ? ? ? PT Short Term Goals - 07/30/21 1334   ? ?  ? PT SHORT TERM GOAL #1  ? Title Will be compliant with appropriate progressive HEP   ? Baseline 4/17- trying to do them every day   ? Time 4   ? Period Weeks   ? Status Achieved   ?  ? PT SHORT TERM GOAL #2  ? Title R shoulder PROM and AAROM to be at least 120 degrees flexion and abduction, 45 degrees ER   ? Baseline 4/17- ER is staying tight   ? Time 4   ? Period Weeks   ? Status Partially Met   ?  ? PT SHORT TERM GOAL #3  ? Title Pain to be no more than 4/10 at worst   ? Time 4   ? Period Weeks   ? Status Achieved   ?  ? PT SHORT TERM GOAL #4  ? Title  Soft tissue restrictions in upper traps and biceps/anterior shoulder to be resolved in order to improve pain and ROM   ? Baseline 4/17- come and go   ? Time 4   ? Period Weeks   ? Status Partially Met   ? ?  ?  ? ?  ? ? ? ? PT Long Term Goals - 07/30/21 1335   ? ?  ? PT LONG TERM GOAL #1  ? Title AROM R shoulder to be at least 150 degrees flexion and ABD without pain   ? Baseline 4/17- flexion140 abd 150   ? Time 8   ? Period Weeks   ? Status Partially Met   ?  ? PT LONG TERM GOAL #2  ? Title MMT in R shoulder flexion and abduction to be at least 4/5   ? Time 8   ? Period Weeks   ? Status Achieved   ?  ? PT LONG TERM GOAL #3  ? Title Will tolerate initiation of active IR/extension ROM and strengthening R shoulder at 12 weeks as per protocol   ? Baseline 4/17- extension OK, IR still limited   ? Time 8   ? Period Weeks   ? Status Partially Met   ?  ? PT LONG TERM GOAL #4  ? Title Will be able to dress and perform self hygiene with R UE without increased pain   ? Baseline 4/17- no issues   ? Time 8   ? Period Weeks   ? Status Achieved   ? ?  ?  ? ?  ? ? ? ? ? ? ? ? Plan - 08/12/21 1152   ? ? Clinical Impression Statement Michele Hanson arrives doing OK today, we continued working on her R shoulder strength. Still has better quality ROM with less compensations on the table so we continued working flexion/ABD/extension in supine/sidelying/prone just with increased weight. Still has a lot of compensations in standing but continued to strengthen what we safely could.  Will continue efforts.   ? Personal Factors and Comorbidities Past/Current Experience;Comorbidity 3+;Time since onset of injury/illness/exacerbation   ? Examination-Activity Limitations Bathing;Bed Mobility;Reach Overhead;Self Feeding;Caring for Others;Sleep;Carry;Dressing;Hygiene/Grooming;Lift;Toileting   ? Examination-Participation Restrictions Meal Prep;Cleaning;Community Activity;Driving;Interpersonal Relationship;Shop;Laundry;Yard Work;Medication Management   ?  Stability/Clinical Decision Making Stable/Uncomplicated   ? Clinical Decision Making Low   ? Rehab Potential Good   ? PT Frequency 2x / week   ? PT Duration 6 weeks   ? PT Treatment/Interventions ADLs/Self Care Home Management;Biofeedback;Cryotherapy;Electrical Stimulation;Iontophoresis 4mg /ml Dexamethasone;Moist Heat;Ultrasound;Functional mobility training;Therapeutic activities;Therapeutic exercise;Patient/family education;Manual techniques;Scar mobilization;Passive range of motion;Taping;Vasopneumatic Device   ? PT Next Visit Plan needs more DN to RUE, needs to continue PT for strengthening; periscapular strength and control   ? PT Home Exercise Plan [id number]   ? Consulted and Agree with Plan of Care Patient   ? ?  ?  ? ?  ? ? ?Patient will benefit from skilled therapeutic intervention in order to improve the following deficits and impairments:  Decreased range of motion, Increased fascial restricitons, Increased muscle spasms, Impaired UE functional use, Decreased activity tolerance, Decreased knowledge of precautions, Impaired perceived functional ability, Pain, Decreased scar mobility, Hypomobility, Improper body mechanics, Decreased strength, Postural dysfunction ? ?Visit Diagnosis: ?Muscle weakness (generalized) ? ?Acute pain of right shoulder ? ?Abnormal posture ? ?Localized edema ? ?Stiffness of right shoulder, not elsewhere classified ? ? ? ? ?Problem List ?Patient Active Problem List  ? Diagnosis Date Noted  ? Alcohol use 07/02/2021  ? De Quervain's tenosynovitis, right 07/22/2019  ? Abrasion of right knee 03/28/2019  ? Contusion of right chest wall 03/28/2019  ? Sprain of right wrist 03/28/2019  ? Bilateral carpal tunnel syndrome 03/17/2019  ? Hepatic steatosis 03/15/2019  ? Tendinitis of left rotator cuff 01/28/2019  ? Capsulitis of right shoulder 01/28/2019  ? Chronic right shoulder pain 01/21/2019  ? History of hepatitis C 01/21/2019  ? Joint pain in both hands 01/18/2019  ? Need for influenza  vaccination 01/18/2019  ? Depression with anxiety 09/15/2018  ? Seasonal allergic rhinitis due to pollen 03/16/2018  ? B12 deficiency 07/16/2017  ? Neuropathy 07/15/2017  ? Spinal stenosis of lumbar region w

## 2021-08-15 ENCOUNTER — Ambulatory Visit: Payer: Medicare Other | Admitting: Physical Therapy

## 2021-08-15 ENCOUNTER — Other Ambulatory Visit: Payer: Self-pay | Admitting: Family Medicine

## 2021-08-15 ENCOUNTER — Encounter: Payer: Self-pay | Admitting: Physical Therapy

## 2021-08-15 DIAGNOSIS — M6281 Muscle weakness (generalized): Secondary | ICD-10-CM | POA: Diagnosis not present

## 2021-08-15 DIAGNOSIS — R600 Localized edema: Secondary | ICD-10-CM

## 2021-08-15 DIAGNOSIS — M25611 Stiffness of right shoulder, not elsewhere classified: Secondary | ICD-10-CM

## 2021-08-15 DIAGNOSIS — J301 Allergic rhinitis due to pollen: Secondary | ICD-10-CM

## 2021-08-15 DIAGNOSIS — M25511 Pain in right shoulder: Secondary | ICD-10-CM

## 2021-08-15 DIAGNOSIS — R293 Abnormal posture: Secondary | ICD-10-CM

## 2021-08-15 NOTE — Therapy (Signed)
[facility] ?[address] ?[address] ?[place], [place], [address] ?Phone: [phone]   Fax:  [phone] ? ?Physical Therapy Treatment ? ?Patient Details  ?Name: Michele Hanson ?MRN: [id number] ?Date of Birth: 1950/08/31 ?Referring Provider (PT): Dax Griffin Basil ? ? ?Encounter Date: 08/15/2021 ? ? PT End of Session - 08/15/21 1529   ? ? Visit Number 33   ? Number of Visits 37   ? Date for PT Re-Evaluation 09/10/21   ? Authorization Type [facility] MCR   ? Authorization Time Period 04/17/21 to 06/12/21; extended to 4/11; re-extended to 5/30   ? Progress Note Due on Visit 38   ? PT Start Time 1450   ? PT Stop Time 1529   ? PT Time Calculation (min) 39 min   ? Activity Tolerance Patient tolerated treatment well   ? Behavior During Therapy [facility] for tasks assessed/performed   ? ?  ?  ? ?  ? ? ?Past Medical History:  ?Diagnosis Date  ? Anxiety   ? Arthritis   ? Depression   ? Fatty liver disease, nonalcoholic   ? GERD (gastroesophageal reflux disease)   ? Hepatitis 1973  ? hep c-was treated  ? Hyperlipemia   ? Hypertension   ? ? ?Past Surgical History:  ?Procedure Laterality Date  ? BACK SURGERY    ? CARPAL TUNNEL RELEASE Bilateral   ? CHOLECYSTECTOMY    ? DIAGNOSTIC LAPAROSCOPY    ? INCONTINENCE SURGERY    ? LAPAROSCOPIC LYSIS OF ADHESIONS    ? REVERSE SHOULDER ARTHROPLASTY Right 03/20/2021  ? Procedure: REVERSE SHOULDER ARTHROPLASTY;  Surgeon: Hiram Gash, MD;  Location: [facility];  Service: Orthopedics;  Laterality: Right;  ? ? ?There were no vitals filed for this visit. ? ? Subjective Assessment - 08/15/21 1450   ? ? Subjective I'm sore from the workout last time, otherwise all is well   ? Patient Stated Goals would like to get arm back to normal- as much as I can after the procedure   ? Currently in Pain? No/denies   ? ?  ?  ? ?  ? ? ? ? ? ? ? ? ? ? ? ? ? ? ? ? ? ? ? ? [place] [facility] Adult PT Treatment/Exercise - 08/15/21 0001   ? ?  ? Shoulder Exercises: Seated  ? Other Seated  Exercises UBE L4.5 59mn forward/3 min backward   ?  ? Shoulder Exercises: Standing  ? Flexion Right;15 reps   then x10 with 3#  ? Shoulder Flexion Weight (lbs) 2   ? ABduction Strengthening;Right   ? ABduction Limitations x6 with 2# weight, hten x10 with 1#   ? Extension Strengthening;Both;15 reps   ? Extension Weight (lbs) 5   ? Other Standing Exercises shoulder flexion stretch  2 rounds 5x10 seconds on ladder; shoulder abduction pillow case stretch 2 rounds 5x10 seconds between resistance rounds; shoulder ER stretch 2x60 seconds on corner   ? Other Standing Exercises shoulder stability flexion; 3 way shoulder satbility x10 B red TB on wall   ?  ? Shoulder Exercises: Body Blade  ? Flexion Limitations x10 horizontal and vertical   ? ?  ?  ? ?  ? ? ? ? ? ? ? ? ? ? PT Education - 08/15/21 1528   ? ? Education Details exercise form   ? Person(s) Educated Patient   ? Methods Explanation   ? Comprehension Verbalized understanding   ? ?  ?  ? ?  ? ? ?  PT Short Term Goals - 07/30/21 1334   ? ?  ? PT SHORT TERM GOAL #1  ? Title Will be compliant with appropriate progressive HEP   ? Baseline 4/17- trying to do them every day   ? Time 4   ? Period Weeks   ? Status Achieved   ?  ? PT SHORT TERM GOAL #2  ? Title R shoulder PROM and AAROM to be at least 120 degrees flexion and abduction, 45 degrees ER   ? Baseline 4/17- ER is staying tight   ? Time 4   ? Period Weeks   ? Status Partially Met   ?  ? PT SHORT TERM GOAL #3  ? Title Pain to be no more than 4/10 at worst   ? Time 4   ? Period Weeks   ? Status Achieved   ?  ? PT SHORT TERM GOAL #4  ? Title Soft tissue restrictions in upper traps and biceps/anterior shoulder to be resolved in order to improve pain and ROM   ? Baseline 4/17- come and go   ? Time 4   ? Period Weeks   ? Status Partially Met   ? ?  ?  ? ?  ? ? ? ? PT Long Term Goals - 07/30/21 1335   ? ?  ? PT LONG TERM GOAL #1  ? Title AROM R shoulder to be at least 150 degrees flexion and ABD without pain   ? Baseline  4/17- flexion140 abd 150   ? Time 8   ? Period Weeks   ? Status Partially Met   ?  ? PT LONG TERM GOAL #2  ? Title MMT in R shoulder flexion and abduction to be at least 4/5   ? Time 8   ? Period Weeks   ? Status Achieved   ?  ? PT LONG TERM GOAL #3  ? Title Will tolerate initiation of active IR/extension ROM and strengthening R shoulder at 12 weeks as per protocol   ? Baseline 4/17- extension OK, IR still limited   ? Time 8   ? Period Weeks   ? Status Partially Met   ?  ? PT LONG TERM GOAL #4  ? Title Will be able to dress and perform self hygiene with R UE without increased pain   ? Baseline 4/17- no issues   ? Time 8   ? Period Weeks   ? Status Achieved   ? ?  ?  ? ?  ? ? ? ? ? ? ? ? Plan - 08/15/21 1530   ? ? Clinical Impression Statement Michele Hanson arrives doing well today, but she is sore. Tried progressing her to more standing activities but had to monitor closely due to tendency to compensate with mm fatigue. Alternated between work periods with resistance training and recovery training with shoulder stretches today. Doing well, will continue efforts.   ? Personal Factors and Comorbidities Past/Current Experience;Comorbidity 3+;Time since onset of injury/illness/exacerbation   ? Examination-Activity Limitations Bathing;Bed Mobility;Reach Overhead;Self Feeding;Caring for Others;Sleep;Carry;Dressing;Hygiene/Grooming;Lift;Toileting   ? Examination-Participation Restrictions Meal Prep;Cleaning;Community Activity;Driving;Interpersonal Relationship;Shop;Laundry;Yard Work;Medication Management   ? Stability/Clinical Decision Making Stable/Uncomplicated   ? Clinical Decision Making Low   ? Rehab Potential Good   ? PT Frequency 2x / week   ? PT Duration 6 weeks   ? PT Treatment/Interventions ADLs/Self Care Home Management;Biofeedback;Cryotherapy;Electrical Stimulation;Iontophoresis 77m/ml Dexamethasone;Moist Heat;Ultrasound;Functional mobility training;Therapeutic activities;Therapeutic exercise;Patient/family  education;Manual techniques;Scar mobilization;Passive range of motion;Taping;Vasopneumatic Device   ? PT Next  Visit Plan needs more DN to RUE, needs to continue PT for strengthening; periscapular strength and control   ? PT Home Exercise Plan [id number]   ? Consulted and Agree with Plan of Care Patient   ? ?  ?  ? ?  ? ? ?Patient will benefit from skilled therapeutic intervention in order to improve the following deficits and impairments:  Decreased range of motion, Increased fascial restricitons, Increased muscle spasms, Impaired UE functional use, Decreased activity tolerance, Decreased knowledge of precautions, Impaired perceived functional ability, Pain, Decreased scar mobility, Hypomobility, Improper body mechanics, Decreased strength, Postural dysfunction ? ?Visit Diagnosis: ?Muscle weakness (generalized) ? ?Abnormal posture ? ?Acute pain of right shoulder ? ?Localized edema ? ?Stiffness of right shoulder, not elsewhere classified ? ? ? ? ?Problem List ?Patient Active Problem List  ? Diagnosis Date Noted  ? Alcohol use 07/02/2021  ? De Quervain's tenosynovitis, right 07/22/2019  ? Abrasion of right knee 03/28/2019  ? Contusion of right chest wall 03/28/2019  ? Sprain of right wrist 03/28/2019  ? Bilateral carpal tunnel syndrome 03/17/2019  ? Hepatic steatosis 03/15/2019  ? Tendinitis of left rotator cuff 01/28/2019  ? Capsulitis of right shoulder 01/28/2019  ? Chronic right shoulder pain 01/21/2019  ? History of hepatitis C 01/21/2019  ? Joint pain in both hands 01/18/2019  ? Need for influenza vaccination 01/18/2019  ? Depression with anxiety 09/15/2018  ? Seasonal allergic rhinitis due to pollen 03/16/2018  ? B12 deficiency 07/16/2017  ? Neuropathy 07/15/2017  ? Spinal stenosis of lumbar region with neurogenic claudication 07/15/2017  ? Elevated cholesterol 07/15/2017  ? Healthcare maintenance 07/15/2017  ? Essential hypertension 07/15/2017  ? Abnormal CBC 07/15/2017  ? Monoclonal paraproteinemia 03/23/2012   ? ?Sascha Palma U PT, DPT, PN2  ? ?Supplemental Physical Therapist ?[facility]  ? ? ? ? ? ?[facility] ?[address] ?[address] ?[place], [place], [address] ?Phone: [phone]   Fax

## 2021-08-26 ENCOUNTER — Encounter: Payer: Self-pay | Admitting: Physical Therapy

## 2021-08-26 ENCOUNTER — Ambulatory Visit: Payer: Medicare Other | Admitting: Physical Therapy

## 2021-08-26 DIAGNOSIS — M6281 Muscle weakness (generalized): Secondary | ICD-10-CM | POA: Diagnosis not present

## 2021-08-26 DIAGNOSIS — R293 Abnormal posture: Secondary | ICD-10-CM

## 2021-08-26 DIAGNOSIS — R600 Localized edema: Secondary | ICD-10-CM

## 2021-08-26 DIAGNOSIS — M25511 Pain in right shoulder: Secondary | ICD-10-CM

## 2021-08-26 DIAGNOSIS — M25611 Stiffness of right shoulder, not elsewhere classified: Secondary | ICD-10-CM

## 2021-08-26 NOTE — Therapy (Signed)
[facility] [address] ?[place], [place], [address] ?Phone: [phone]   Fax:  [phone] ? ?Physical Therapy Treatment ? ?Patient Details  ?Name: Michele Hanson ?MRN: [id number] ?Date of Birth: 11-01-1950 ?Referring Provider (PT): Dax Griffin Basil ? ? ?Encounter Date: 08/26/2021 ? ? PT End of Session - 08/26/21 1703   ? ? Visit Number 34   ? Number of Visits 37   ? Date for PT Re-Evaluation 09/10/21   ? Authorization Type [facility] MCR   ? Authorization Time Period 04/17/21 to 06/12/21; extended to 4/11; re-extended to 5/30   ? Progress Note Due on Visit 38   ? PT Start Time 1616   ? PT Stop Time 1657   ? PT Time Calculation (min) 41 min   ? Activity Tolerance Patient tolerated treatment well   ? Behavior During Therapy [facility] for tasks assessed/performed   ? ?  ?  ? ?  ? ? ?Past Medical History:  ?Diagnosis Date  ? Anxiety   ? Arthritis   ? Depression   ? Fatty liver disease, nonalcoholic   ? GERD (gastroesophageal reflux disease)   ? Hepatitis 1973  ? hep c-was treated  ? Hyperlipemia   ? Hypertension   ? ? ?Past Surgical History:  ?Procedure Laterality Date  ? BACK SURGERY    ? CARPAL TUNNEL RELEASE Bilateral   ? CHOLECYSTECTOMY    ? DIAGNOSTIC LAPAROSCOPY    ? INCONTINENCE SURGERY    ? LAPAROSCOPIC LYSIS OF ADHESIONS    ? REVERSE SHOULDER ARTHROPLASTY Right 03/20/2021  ? Procedure: REVERSE SHOULDER ARTHROPLASTY;  Surgeon: Hiram Gash, MD;  Location: [facility];  Service: Orthopedics;  Laterality: Right;  ? ? ?There were no vitals filed for this visit. ? ? Subjective Assessment - 08/26/21 1618   ? ? Subjective Had a good trip, shoulder is feeling good. Got a new dog.   ? Patient Stated Goals would like to get arm back to normal- as much as I can after the procedure   ? Currently in Pain? No/denies   ? ?  ?  ? ?  ? ? ? ? ? ? ? ? ? ? ? ? ? ? ? ? ? ? ? ? [place] [place] Adult PT Treatment/Exercise - 08/26/21 0001   ? ?  ? Shoulder Exercises: Seated  ? Other Seated  Exercises UBE L4.5 28min forward/3 min backward   ?  ? Shoulder Exercises: Standing  ? Flexion Right;10 reps   ? Shoulder Flexion Weight (lbs) 3   2 sets  ? ABduction Strengthening;Right;10 reps   ? Shoulder ABduction Weight (lbs) 1   2 sets  ? Extension Strengthening;Both;15 reps   ? Extension Weight (lbs) 5   ? Other Standing Exercises shoulder flexion and aBD stretches 10x10 seconds B   ? Other Standing Exercises self IR/ER MET on wall x10 5 seconds holds each   ?  ? Shoulder Exercises: Body Blade  ? Flexion Limitations x10 horizontal and vertical   ?  ? Manual Therapy  ? Manual Therapy Soft tissue mobilization;Myofascial release   ? Soft tissue mobilization R anterior delt   ? Myofascial Release R anterior delt   ? ?  ?  ? ?  ? ? ? ? ? ? ? ? ? ? PT Education - 08/26/21 1702   ? ? Education Details POC, DC next session   ? Person(s) Educated Patient   ? Methods Explanation   ? Comprehension Verbalized understanding   ? ?  ?  ? ?  ? ? ?  PT Short Term Goals - 07/30/21 1334   ? ?  ? PT SHORT TERM GOAL #1  ? Title Will be compliant with appropriate progressive HEP   ? Baseline 4/17- trying to do them every day   ? Time 4   ? Period Weeks   ? Status Achieved   ?  ? PT SHORT TERM GOAL #2  ? Title R shoulder PROM and AAROM to be at least 120 degrees flexion and abduction, 45 degrees ER   ? Baseline 4/17- ER is staying tight   ? Time 4   ? Period Weeks   ? Status Partially Met   ?  ? PT SHORT TERM GOAL #3  ? Title Pain to be no more than 4/10 at worst   ? Time 4   ? Period Weeks   ? Status Achieved   ?  ? PT SHORT TERM GOAL #4  ? Title Soft tissue restrictions in upper traps and biceps/anterior shoulder to be resolved in order to improve pain and ROM   ? Baseline 4/17- come and go   ? Time 4   ? Period Weeks   ? Status Partially Met   ? ?  ?  ? ?  ? ? ? ? PT Long Term Goals - 07/30/21 1335   ? ?  ? PT LONG TERM GOAL #1  ? Title AROM R shoulder to be at least 150 degrees flexion and ABD without pain   ? Baseline 4/17-  flexion140 abd 150   ? Time 8   ? Period Weeks   ? Status Partially Met   ?  ? PT LONG TERM GOAL #2  ? Title MMT in R shoulder flexion and abduction to be at least 4/5   ? Time 8   ? Period Weeks   ? Status Achieved   ?  ? PT LONG TERM GOAL #3  ? Title Will tolerate initiation of active IR/extension ROM and strengthening R shoulder at 12 weeks as per protocol   ? Baseline 4/17- extension OK, IR still limited   ? Time 8   ? Period Weeks   ? Status Partially Met   ?  ? PT LONG TERM GOAL #4  ? Title Will be able to dress and perform self hygiene with R UE without increased pain   ? Baseline 4/17- no issues   ? Time 8   ? Period Weeks   ? Status Achieved   ? ?  ?  ? ?  ? ? ? ? ? ? ? ? Plan - 08/26/21 1703   ? ? Clinical Impression Statement Michele Hanson arrives doing well, feels like she is doing as well as she can with her shoulder at this point. We are in agreement that at this point she may have maximized benefit from skilled PT services at this time, and he needs can be met with advanced HEP. Will plan to get final round of objective measurements and finalize HEP next session.   ? Personal Factors and Comorbidities Past/Current Experience;Comorbidity 3+;Time since onset of injury/illness/exacerbation   ? Examination-Activity Limitations Bathing;Bed Mobility;Reach Overhead;Self Feeding;Caring for Others;Sleep;Carry;Dressing;Hygiene/Grooming;Lift;Toileting   ? Examination-Participation Restrictions Meal Prep;Cleaning;Community Activity;Driving;Interpersonal Relationship;Shop;Laundry;Yard Work;Medication Management   ? Stability/Clinical Decision Making Stable/Uncomplicated   ? Clinical Decision Making Low   ? Rehab Potential Good   ? PT Frequency 2x / week   ? PT Duration 6 weeks   ? PT Treatment/Interventions ADLs/Self Care Home Management;Biofeedback;Cryotherapy;Electrical Stimulation;Iontophoresis 4mg /ml Dexamethasone;Moist Heat;Ultrasound;Functional mobility training;Therapeutic  activities;Therapeutic  exercise;Patient/family education;Manual techniques;Scar mobilization;Passive range of motion;Taping;Vasopneumatic Device   ? PT Next Visit Plan reassess and DC   ? PT Home Exercise Plan [email or web address]   ? Consulted and Agree with Plan of Care Patient   ? ?  ?  ? ?  ? ? ?Patient will benefit from skilled therapeutic intervention in order to improve the following deficits and impairments:  Decreased range of motion, Increased fascial restricitons, Increased muscle spasms, Impaired UE functional use, Decreased activity tolerance, Decreased knowledge of precautions, Impaired perceived functional ability, Pain, Decreased scar mobility, Hypomobility, Improper body mechanics, Decreased strength, Postural dysfunction ? ?Visit Diagnosis: ?Muscle weakness (generalized) ? ?Acute pain of right shoulder ? ?Abnormal posture ? ?Localized edema ? ?Stiffness of right shoulder, not elsewhere classified ? ? ? ? ?Problem List ?Patient Active Problem List  ? Diagnosis Date Noted  ? Alcohol use 07/02/2021  ? De Quervain's tenosynovitis, right 07/22/2019  ? Abrasion of right knee 03/28/2019  ? Contusion of right chest wall 03/28/2019  ? Sprain of right wrist 03/28/2019  ? Bilateral carpal tunnel syndrome 03/17/2019  ? Hepatic steatosis 03/15/2019  ? Tendinitis of left rotator cuff 01/28/2019  ? Capsulitis of right shoulder 01/28/2019  ? Chronic right shoulder pain 01/21/2019  ? History of hepatitis C 01/21/2019  ? Joint pain in both hands 01/18/2019  ? Need for influenza vaccination 01/18/2019  ? Depression with anxiety 09/15/2018  ? Seasonal allergic rhinitis due to pollen 03/16/2018  ? B12 deficiency 07/16/2017  ? Neuropathy 07/15/2017  ? Spinal stenosis of lumbar region with neurogenic claudication 07/15/2017  ? Elevated cholesterol 07/15/2017  ? Healthcare maintenance 07/15/2017  ? Essential hypertension 07/15/2017  ? Abnormal CBC 07/15/2017  ? Monoclonal paraproteinemia 03/23/2012  ? ?Kazuma Elena U PT, DPT, PN2  ? ?Supplemental Physical  Therapist ?[facility]  ? ? ? ? ?[place] [address] ?[address] ?[place], [place], [address] ?Phone: [phone]   Fax:  [phone] ? ?Name: Michele Hanson ?MRN: [id number] ?Date of [facility]

## 2021-08-28 ENCOUNTER — Encounter: Payer: Self-pay | Admitting: Physical Therapy

## 2021-08-28 ENCOUNTER — Ambulatory Visit: Payer: Medicare Other | Admitting: Physical Therapy

## 2021-08-28 DIAGNOSIS — R600 Localized edema: Secondary | ICD-10-CM

## 2021-08-28 DIAGNOSIS — M25511 Pain in right shoulder: Secondary | ICD-10-CM

## 2021-08-28 DIAGNOSIS — M6281 Muscle weakness (generalized): Secondary | ICD-10-CM

## 2021-08-28 DIAGNOSIS — M25611 Stiffness of right shoulder, not elsewhere classified: Secondary | ICD-10-CM

## 2021-08-28 DIAGNOSIS — R293 Abnormal posture: Secondary | ICD-10-CM

## 2021-08-28 NOTE — Therapy (Signed)
[facility] ?[place] [address] ?[place], [place], [address] ?Phone: [phone]   Fax:  [phone] ? ?Physical Therapy Treatment ? ?Patient Details  ?Name: Michele Hanson ?MRN: [id number] ?Date of Birth: [birth year] ?Referring Provider (PT): Dax Griffin Basil ? ? ?Encounter Date: 08/28/2021 ? ?PHYSICAL THERAPY DISCHARGE SUMMARY ? ?Visits from Start of Care: 35 ? ?Current functional level related to goals / functional outcomes: ?Pleased with current level of function and comfortable with HEP and independent gym activity. Appropriate for DC to self care/home program.  ?  ?Remaining deficits: ?Weakness, limited ROM but both appropriate for this stage after complex surgery  ?  ?Education / Equipment: ?See below   ? ?Patient agrees to discharge. Patient goals were partially met. Patient is being discharged due to being pleased with the current functional level. ? ? ? PT End of Session - 08/28/21 1146   ? ? Visit Number 35   ? Number of Visits 35   ? Authorization Type [facility] MCR   ? Authorization Time Period 04/17/21 to 06/12/21; extended to 4/11; re-extended to 5/30   ? PT Start Time 1102   ? PT Stop Time 1136   ? PT Time Calculation (min) 34 min   ? Activity Tolerance Patient tolerated treatment well   ? Behavior During Therapy [facility] for tasks assessed/performed   ? ?  ?  ? ?  ? ? ?Past Medical History:  ?Diagnosis Date  ? Anxiety   ? Arthritis   ? Depression   ? Fatty liver disease, nonalcoholic   ? GERD (gastroesophageal reflux disease)   ? Hepatitis 1973  ? hep c-was treated  ? Hyperlipemia   ? Hypertension   ? ? ?Past Surgical History:  ?Procedure Laterality Date  ? BACK SURGERY    ? CARPAL TUNNEL RELEASE Bilateral   ? CHOLECYSTECTOMY    ? DIAGNOSTIC LAPAROSCOPY    ? INCONTINENCE SURGERY    ? LAPAROSCOPIC LYSIS OF ADHESIONS    ? REVERSE SHOULDER ARTHROPLASTY Right 03/20/2021  ? Procedure: REVERSE SHOULDER ARTHROPLASTY;  Surgeon: Hiram Gash, MD;  Location: [facility];  Service: Orthopedics;  Laterality: Right;  ? ? ?There were no vitals filed for this visit. ? ? Subjective Assessment - 08/28/21 1110   ? ? Subjective Feeling good, just sore from last workout. Still want to make today my last day.   ? Patient Stated Goals would like to get arm back to normal- as much as I can after the procedure   ? Currently in Pain? No/denies   ? ?  ?  ? ?  ? ? ? ? ? [facility] PT Assessment - 08/28/21 0001   ? ?  ? Observation/Other Assessments  ? Focus on Therapeutic Outcomes (FOTO)  63   ?  ? AROM  ? Right Shoulder Flexion 135 Degrees   ? Right Shoulder ABduction 133 Degrees   ? Right Shoulder Internal Rotation --   full ROM  ? Right Shoulder External Rotation 60 Degrees   ?  ? Strength  ? Right Shoulder Flexion 4+/5   ? Right Shoulder Extension 4-/5   ? Right Shoulder ABduction 4+/5   ? Right Shoulder Internal Rotation 4-/5   ? Right Shoulder External Rotation 3+/5   ? Right Elbow Flexion 4/5   ? Right Elbow Extension 4/5   ? ?  ?  ? ?  ? ? ? ? ? ? ? ? ? ? ? ? ? ? ? ? [facility]  Adult PT Treatment/Exercise - 08/28/21 0001   ? ?  ? Shoulder Exercises: Seated  ? Other Seated Exercises UBE L4 65min forward/3 min backward   ? ?  ?  ? ?  ? ? ? ? ? ? ? ? ? ? PT Education - 08/28/21 1146   ? ? Education Details POC, HEP review, education on appropriate "rule of thumb" for progressing her HEP and reps/sets at home, recommend 1 on 1 personal trainer prior to any weight work or weight machines at the gym, role of time in recovering from surgery   ? Person(s) Educated Patient   ? Methods Explanation   ? Comprehension Verbalized understanding   ? ?  ?  ? ?  ? ? ? PT Short Term Goals - 08/28/21 1124   ? ?  ? PT SHORT TERM GOAL #1  ? Title Will be compliant with appropriate progressive HEP   ? Baseline 5/17- compliant   ? Time 4   ? Period Weeks   ? Status Achieved   ?  ? PT SHORT TERM GOAL #2  ? Title R shoulder PROM and AAROM to be at least 120 degrees flexion and abduction, 45 degrees ER   ? Baseline 5/17-  ER 60   ? Time 4   ? Period Weeks   ? Status Achieved   ?  ? PT SHORT TERM GOAL #3  ? Title Pain to be no more than 4/10 at worst   ? Time 4   ? Period Weeks   ? Status Achieved   ?  ? PT SHORT TERM GOAL #4  ? Title Soft tissue restrictions in upper traps and biceps/anterior shoulder to be resolved in order to improve pain and ROM   ? Baseline 5/17- come and go   ? Time 4   ? Period Weeks   ? Status Partially Met   ? ?  ?  ? ?  ? ? ? ? PT Long Term Goals - 08/28/21 1126   ? ?  ? PT LONG TERM GOAL #1  ? Title AROM R shoulder to be at least 150 degrees flexion and ABD without pain   ? Baseline 5/17- not quite met yet   ? Time 8   ? Period Weeks   ? Status Partially Met   ?  ? PT LONG TERM GOAL #2  ? Title MMT in R shoulder flexion and abduction to be at least 4/5   ? Time 8   ? Period Weeks   ? Status Achieved   ?  ? PT LONG TERM GOAL #3  ? Title Will tolerate initiation of active IR/extension ROM and strengthening R shoulder at 12 weeks as per protocol   ? Time 8   ? Period Weeks   ? Status Achieved   ?  ? PT LONG TERM GOAL #4  ? Title Will be able to dress and perform self hygiene with R UE without increased pain   ? Time 8   ? Period Weeks   ? Status Achieved   ? ?  ?  ? ?  ? ? ? ? ? ? ? ? Plan - 08/28/21 1147   ? ? Clinical Impression Statement Michele Hanson arrives today doing well, would still like to make today her last day. Objective measures do show improvement but we are both in agreement that she is at the point she can manage this on her own with advanced HEP and water exercises. Current HEP  is appropriate. DC today thank you for the opportunity to participate in her care!   ? Personal Factors and Comorbidities Past/Current Experience;Comorbidity 3+;Time since onset of injury/illness/exacerbation   ? Examination-Activity Limitations Bathing;Bed Mobility;Reach Overhead;Self Feeding;Caring for Others;Sleep;Carry;Dressing;Hygiene/Grooming;Lift;Toileting   ? Examination-Participation Restrictions Meal  Prep;Cleaning;Community Activity;Driving;Interpersonal Relationship;Shop;Laundry;Yard Work;Medication Management   ? Stability/Clinical Decision Making Stable/Uncomplicated   ? Clinical Decision Making Low   ? Rehab Potential Good   ? PT Frequency Other (comment)   DC today  ? PT Duration Other (comment)   DC today  ? PT Treatment/Interventions ADLs/Self Care Home Management;Biofeedback;Cryotherapy;Electrical Stimulation;Iontophoresis 4mg /ml Dexamethasone;Moist Heat;Ultrasound;Functional mobility training;Therapeutic activities;Therapeutic exercise;Patient/family education;Manual techniques;Scar mobilization;Passive range of motion;Taping;Vasopneumatic Device   ? PT Next Visit Plan DC   ? PT Home Exercise Plan [id number]   ? Consulted and Agree with Plan of Care Patient   ? ?  ?  ? ?  ? ? ?Patient will benefit from skilled therapeutic intervention in order to improve the following deficits and impairments:  Decreased range of motion, Increased fascial restricitons, Increased muscle spasms, Impaired UE functional use, Decreased activity tolerance, Decreased knowledge of precautions, Impaired perceived functional ability, Pain, Decreased scar mobility, Hypomobility, Improper body mechanics, Decreased strength, Postural dysfunction ? ?Visit Diagnosis: ?Muscle weakness (generalized) ? ?Acute pain of right shoulder ? ?Abnormal posture ? ?Localized edema ? ?Stiffness of right shoulder, not elsewhere classified ? ? ? ? ?Problem List ?Patient Active Problem List  ? Diagnosis Date Noted  ? Alcohol use 07/02/2021  ? De Quervain's tenosynovitis, right 07/22/2019  ? Abrasion of right knee 03/28/2019  ? Contusion of right chest wall 03/28/2019  ? Sprain of right wrist 03/28/2019  ? Bilateral carpal tunnel syndrome 03/17/2019  ? Hepatic steatosis 03/15/2019  ? Tendinitis of left rotator cuff 01/28/2019  ? Capsulitis of right shoulder 01/28/2019  ? Chronic right shoulder pain 01/21/2019  ? History of hepatitis C 01/21/2019  ? Joint  pain in both hands 01/18/2019  ? Need for influenza vaccination 01/18/2019  ? Depression with anxiety 09/15/2018  ? Seasonal allergic rhinitis due to pollen 03/16/2018  ? B12 deficiency 07/16/2017  ? Neuropathy 07/15/2017

## 2021-09-02 ENCOUNTER — Ambulatory Visit: Payer: Medicare Other | Admitting: Physical Therapy

## 2021-09-04 ENCOUNTER — Ambulatory Visit: Payer: Medicare Other | Admitting: Physical Therapy

## 2021-09-15 ENCOUNTER — Other Ambulatory Visit: Payer: Self-pay | Admitting: Family Medicine

## 2021-09-15 DIAGNOSIS — F418 Other specified anxiety disorders: Secondary | ICD-10-CM

## 2021-10-03 ENCOUNTER — Other Ambulatory Visit: Payer: Self-pay | Admitting: Family Medicine

## 2021-10-27 ENCOUNTER — Other Ambulatory Visit: Payer: Self-pay | Admitting: Family Medicine

## 2021-10-27 DIAGNOSIS — I10 Essential (primary) hypertension: Secondary | ICD-10-CM

## 2021-11-28 ENCOUNTER — Ambulatory Visit: Payer: Medicare Other | Admitting: Family Medicine

## 2021-12-10 ENCOUNTER — Other Ambulatory Visit: Payer: Self-pay | Admitting: Family Medicine

## 2021-12-10 DIAGNOSIS — M48062 Spinal stenosis, lumbar region with neurogenic claudication: Secondary | ICD-10-CM

## 2022-01-01 ENCOUNTER — Ambulatory Visit (INDEPENDENT_AMBULATORY_CARE_PROVIDER_SITE_OTHER): Payer: Medicare Other | Admitting: Family Medicine

## 2022-01-01 ENCOUNTER — Encounter: Payer: Self-pay | Admitting: Family Medicine

## 2022-01-01 VITALS — BP 126/78 | HR 68 | Temp 97.2°F | Ht 66.0 in | Wt 164.0 lb

## 2022-01-01 DIAGNOSIS — I10 Essential (primary) hypertension: Secondary | ICD-10-CM | POA: Diagnosis not present

## 2022-01-01 DIAGNOSIS — E7800 Pure hypercholesterolemia, unspecified: Secondary | ICD-10-CM

## 2022-01-01 DIAGNOSIS — R7309 Other abnormal glucose: Secondary | ICD-10-CM | POA: Diagnosis not present

## 2022-01-01 DIAGNOSIS — Z789 Other specified health status: Secondary | ICD-10-CM

## 2022-01-01 DIAGNOSIS — G629 Polyneuropathy, unspecified: Secondary | ICD-10-CM | POA: Diagnosis not present

## 2022-01-01 DIAGNOSIS — Z2911 Encounter for prophylactic immunotherapy for respiratory syncytial virus (RSV): Secondary | ICD-10-CM | POA: Insufficient documentation

## 2022-01-01 DIAGNOSIS — Z23 Encounter for immunization: Secondary | ICD-10-CM

## 2022-01-01 DIAGNOSIS — K760 Fatty (change of) liver, not elsewhere classified: Secondary | ICD-10-CM | POA: Diagnosis not present

## 2022-01-01 DIAGNOSIS — M129 Arthropathy, unspecified: Secondary | ICD-10-CM

## 2022-01-01 LAB — BASIC METABOLIC PANEL
BUN: 19 mg/dL (ref 6–23)
CO2: 24 mEq/L (ref 19–32)
Calcium: 9.2 mg/dL (ref 8.4–10.5)
Chloride: 102 mEq/L (ref 96–112)
Creatinine, Ser: 0.86 mg/dL (ref 0.40–1.20)
GFR: 68.21 mL/min (ref >60.00)
Glucose, Bld: 93 mg/dL (ref 70–99)
Potassium: 3.8 mEq/L (ref 3.5–5.1)
Sodium: 138 mEq/L (ref 135–145)

## 2022-01-01 LAB — CBC
HCT: 39.1 % (ref 36.0–46.0)
Hemoglobin: 13 g/dL (ref 12.0–15.0)
MCHC: 33.3 g/dL (ref 30.0–36.0)
MCV: 92.4 fl (ref 78.0–100.0)
Platelets: 165 10*3/uL (ref 150.0–400.0)
RBC: 4.23 Mil/uL (ref 3.87–5.11)
RDW: 13.5 % (ref 11.5–15.5)
WBC: 5.1 10*3/uL (ref 4.0–10.5)

## 2022-01-01 LAB — LDL CHOLESTEROL, DIRECT: Direct LDL: 96 mg/dL

## 2022-01-01 LAB — HEPATIC FUNCTION PANEL
ALT: 16 U/L (ref 0–35)
AST: 18 U/L (ref 0–37)
Albumin: 4.2 g/dL (ref 3.5–5.2)
Alkaline Phosphatase: 53 U/L (ref 39–117)
Bilirubin, Direct: 0.1 mg/dL (ref 0.0–0.3)
Total Bilirubin: 0.6 mg/dL (ref 0.2–1.2)
Total Protein: 6.8 g/dL (ref 6.0–8.3)

## 2022-01-01 LAB — HEMOGLOBIN A1C: Hgb A1c MFr Bld: 5.8 % (ref 4.6–6.5)

## 2022-01-01 MED ORDER — AREXVY 120 MCG/0.5ML IM SUSR: 0.5000 mL | Freq: Once | INTRAMUSCULAR | 0 refills | Status: AC

## 2022-01-01 MED ORDER — MELOXICAM 15 MG PO TABS: ORAL_TABLET | ORAL | 0 refills | Status: DC

## 2022-01-01 MED ORDER — GABAPENTIN 400 MG PO CAPS: 400.0000 mg | ORAL_CAPSULE | Freq: Three times a day (TID) | ORAL | 2 refills | Status: DC

## 2022-01-01 NOTE — Progress Notes (Signed)
Established Patient Office Visit  Subjective   Patient ID: Michele Hanson, female    DOB: 1950/12/06  Age: 71 y.o. MRN: [id number]  Chief Complaint  Patient presents with   Follow-up    6 month follow up, no concerns. Patient not fasting.     HPI follow-up of hypertension, elevated cholesterol, neuropathy and alcohol use.  Patient has moderated alcohol usage to a couple of drinks at night.  There are days when she does not drink.  She is having burning paresthesias in both of her feet that have woken her up at night.  Neurontin does not seem to be helping now.  She has been taking two 325 mg aspirin 3 times a day for her arthritic aches and pains.    Review of Systems  Constitutional: Negative.   HENT: Negative.    Eyes:  Negative for blurred vision, discharge and redness.  Respiratory: Negative.    Cardiovascular: Negative.   Gastrointestinal:  Negative for abdominal pain.  Genitourinary: Negative.   Musculoskeletal: Negative.  Negative for myalgias.  Skin:  Negative for rash.  Neurological:  Negative for tingling, loss of consciousness and weakness.  Endo/Heme/Allergies:  Negative for polydipsia.      Objective:     BP 126/78 (BP Location: Right Arm, Patient Position: Sitting, Cuff Size: Normal)   Pulse 68   Temp (!) 97.2 F (36.2 C) (Temporal)   Ht '5\' 6"'$  (1.676 m)   Wt 164 lb (74.4 kg)   SpO2 94%   BMI 26.47 kg/m    Physical Exam Constitutional:      General: She is not in acute distress.    Appearance: Normal appearance. She is not ill-appearing, toxic-appearing or diaphoretic.  HENT:     Head: Normocephalic and atraumatic.     Right Ear: External ear normal.     Left Ear: External ear normal.  Eyes:     General: No scleral icterus.       Right eye: No discharge.        Left eye: No discharge.     Extraocular Movements: Extraocular movements intact.     Conjunctiva/sclera: Conjunctivae normal.  Cardiovascular:     Rate and Rhythm: Normal rate and  regular rhythm.  Pulmonary:     Effort: Pulmonary effort is normal. No respiratory distress.     Breath sounds: Normal breath sounds.  Abdominal:     General: Bowel sounds are normal.  Skin:    General: Skin is warm and dry.  Neurological:     Mental Status: She is alert and oriented to person, place, and time.  Psychiatric:        Mood and Affect: Mood normal.        Behavior: Behavior normal.      No results found for any visits on 01/01/22.    The 10-year ASCVD risk score (Arnett DK, et al., 2019) is: 11.1%    Assessment & Plan:   Problem List Items Addressed This Visit       Cardiovascular and Mediastinum   Essential hypertension   Relevant Medications   aspirin 325 MG tablet   Other Relevant Orders   Basic metabolic panel   CBC     Digestive   Hepatic steatosis   Relevant Orders   Hepatic function panel     Nervous and Auditory   Neuropathy - Primary   Relevant Medications   gabapentin (NEURONTIN) 400 MG capsule     Musculoskeletal and Integument   Arthritis involving  multiple sites   Relevant Medications   meloxicam (MOBIC) 15 MG tablet     Other   Elevated cholesterol   Relevant Medications   aspirin 325 MG tablet   Other Relevant Orders   LDL cholesterol, direct   Need for influenza vaccination   Relevant Orders   Flu vaccine HIGH DOSE PF (Fluzone High dose) (Completed)   Alcohol use   Relevant Orders   Hepatic function panel   Need for RSV immunization   Relevant Medications   RSV vaccine recomb adjuvanted (AREXVY) 120 MCG/0.5ML injection   Other Visit Diagnoses     Elevated glucose       Relevant Orders   Hemoglobin A1c       Return in about 6 months (around 07/02/2022), or if symptoms worsen or fail to improve.  Long discussion regarding her liver health.  With her history of hep C status posttreatment, hepatic steatosis, and alcohol use, she understands that her liver is greatly strained, nevermind her standing medications.   Weight loss is encouraged.  Alcohol intake is something she can modify.  Assures me that she has cut back significantly.  We will continue to follow.  Will use meloxicam daily as needed for arthritic aches and pains.  Have increased Neurontin to a neuropathy dose or 400 mg 3 times daily.  Rx for RSV vaccine to pharmacy.  Libby Maw, MD

## 2022-01-06 ENCOUNTER — Encounter: Payer: Self-pay | Admitting: Family Medicine

## 2022-01-25 ENCOUNTER — Other Ambulatory Visit: Payer: Self-pay | Admitting: Family Medicine

## 2022-01-25 DIAGNOSIS — E7800 Pure hypercholesterolemia, unspecified: Secondary | ICD-10-CM

## 2022-01-27 ENCOUNTER — Encounter: Payer: Self-pay | Admitting: Family Medicine

## 2022-03-14 ENCOUNTER — Other Ambulatory Visit: Payer: Self-pay | Admitting: Family Medicine

## 2022-03-14 DIAGNOSIS — F418 Other specified anxiety disorders: Secondary | ICD-10-CM

## 2022-04-01 ENCOUNTER — Other Ambulatory Visit: Payer: Self-pay | Admitting: Family Medicine

## 2022-06-10 ENCOUNTER — Other Ambulatory Visit: Payer: Self-pay | Admitting: Family Medicine

## 2022-06-10 DIAGNOSIS — M48062 Spinal stenosis, lumbar region with neurogenic claudication: Secondary | ICD-10-CM

## 2022-06-25 ENCOUNTER — Ambulatory Visit (INDEPENDENT_AMBULATORY_CARE_PROVIDER_SITE_OTHER): Payer: Medicare Other | Admitting: Family Medicine

## 2022-06-25 ENCOUNTER — Encounter: Payer: Self-pay | Admitting: Family Medicine

## 2022-06-25 VITALS — BP 122/74 | HR 77 | Temp 98.0°F | Ht 66.0 in | Wt 158.6 lb

## 2022-06-25 DIAGNOSIS — K760 Fatty (change of) liver, not elsewhere classified: Secondary | ICD-10-CM | POA: Diagnosis not present

## 2022-06-25 DIAGNOSIS — I10 Essential (primary) hypertension: Secondary | ICD-10-CM | POA: Diagnosis not present

## 2022-06-25 DIAGNOSIS — R7303 Prediabetes: Secondary | ICD-10-CM | POA: Insufficient documentation

## 2022-06-25 DIAGNOSIS — E7800 Pure hypercholesterolemia, unspecified: Secondary | ICD-10-CM | POA: Diagnosis not present

## 2022-06-25 DIAGNOSIS — Z789 Other specified health status: Secondary | ICD-10-CM

## 2022-06-25 DIAGNOSIS — E041 Nontoxic single thyroid nodule: Secondary | ICD-10-CM

## 2022-06-25 LAB — HEPATIC FUNCTION PANEL
ALT: 15 U/L (ref 0–35)
AST: 15 U/L (ref 0–37)
Albumin: 4.3 g/dL (ref 3.5–5.2)
Alkaline Phosphatase: 59 U/L (ref 39–117)
Bilirubin, Direct: 0.1 mg/dL (ref 0.0–0.3)
Total Bilirubin: 0.3 mg/dL (ref 0.2–1.2)
Total Protein: 6.7 g/dL (ref 6.0–8.3)

## 2022-06-25 LAB — BASIC METABOLIC PANEL
BUN: 20 mg/dL (ref 6–23)
CO2: 28 mEq/L (ref 19–32)
Calcium: 9.5 mg/dL (ref 8.4–10.5)
Chloride: 101 mEq/L (ref 96–112)
Creatinine, Ser: 0.89 mg/dL (ref 0.40–1.20)
GFR: 65.24 mL/min (ref >60.00)
Glucose, Bld: 88 mg/dL (ref 70–99)
Potassium: 4 mEq/L (ref 3.5–5.1)
Sodium: 140 mEq/L (ref 135–145)

## 2022-06-25 LAB — CBC WITH DIFFERENTIAL/PLATELET
Basophils Absolute: 0 10*3/uL (ref 0.0–0.1)
Basophils Relative: 0.6 % (ref 0.0–3.0)
Eosinophils Absolute: 0.1 10*3/uL (ref 0.0–0.7)
Eosinophils Relative: 1.1 % (ref 0.0–5.0)
HCT: 41.7 % (ref 36.0–46.0)
Hemoglobin: 14 g/dL (ref 12.0–15.0)
Lymphocytes Relative: 29.4 % (ref 12.0–46.0)
Lymphs Abs: 1.6 10*3/uL (ref 0.7–4.0)
MCHC: 33.5 g/dL (ref 30.0–36.0)
MCV: 92.3 fl (ref 78.0–100.0)
Monocytes Absolute: 0.4 10*3/uL (ref 0.1–1.0)
Monocytes Relative: 7.8 % (ref 3.0–12.0)
Neutro Abs: 3.4 10*3/uL (ref 1.4–7.7)
Neutrophils Relative %: 61.1 % (ref 43.0–77.0)
Platelets: 202 10*3/uL (ref 150.0–400.0)
RBC: 4.52 Mil/uL (ref 3.87–5.11)
RDW: 13 % (ref 11.5–15.5)
WBC: 5.6 10*3/uL (ref 4.0–10.5)

## 2022-06-25 LAB — LIPID PANEL
Cholesterol: 176 mg/dL (ref 0–200)
HDL: 76.2 mg/dL (ref >39.00)
LDL Cholesterol: 85 mg/dL (ref 0–99)
NonHDL: 99.45
Total CHOL/HDL Ratio: 2
Triglycerides: 70 mg/dL (ref 0.0–149.0)
VLDL: 14 mg/dL (ref 0.0–40.0)

## 2022-06-25 LAB — TSH: TSH: 1.12 u[IU]/mL (ref 0.35–5.50)

## 2022-06-25 LAB — HEMOGLOBIN A1C: Hgb A1c MFr Bld: 5.5 % (ref 4.6–6.5)

## 2022-06-25 NOTE — Progress Notes (Signed)
Established Patient Office Visit   Subjective:  Patient ID: Michele Hanson, female    DOB: 1950/07/06  Age: 72 y.o. MRN: [id number]  Chief Complaint  Patient presents with   Medical Management of Chronic Issues    6 month follow up on BP and labs. Patient would llike to discuss ultrasound that sister preformed. No concerns. Patient fasting.     HPI Encounter Diagnoses  Name Primary?   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 she has been going to weight loss clinic.  They prescribed her semaglutide which seems to have helped.  Her sister who is a retiring Restaurant manager, fast food performed multiple ultrasounds on her.  Cysts were found in her thyroid.  Review of Systems  Constitutional: Negative.   HENT: Negative.    Eyes:  Negative for blurred vision, discharge and redness.  Respiratory: Negative.    Cardiovascular: Negative.   Gastrointestinal:  Negative for abdominal pain.  Genitourinary: Negative.   Musculoskeletal: Negative.  Negative for myalgias.  Skin:  Negative for rash.  Neurological:  Negative for tingling, loss of consciousness and weakness.  Endo/Heme/Allergies:  Negative for polydipsia.     Current Outpatient Medications:    aspirin 325 MG tablet, Take by mouth., Disp: , Rfl:    atorvastatin (LIPITOR) 20 MG tablet, TAKE ONE TABLET BY MOUTH ONE TIME DAILY, Disp: 90 tablet, Rfl: 3   azelastine (ASTELIN) 0.1 % nasal spray, USE ONE SPRAY INTO BOTH NOSTRILS AS NEEDED, Disp: 30 mL, Rfl: 3   Brimonidine Tartrate (LUMIFY) 0.025 % SOLN, Apply to eye., Disp: , Rfl:    citalopram (CELEXA) 20 MG tablet, TAKE ONE TABLET BY MOUTH ONE TIME DAILY, Disp: 90 tablet, Rfl: 1   denosumab (PROLIA) 60 MG/ML SOSY injection, Inject 60 mg into the skin every 6 (six) months., Disp: , Rfl:    estradiol (ESTRACE) 0.1 MG/GM  vaginal cream, , Disp: , Rfl:    fluticasone (FLONASE) 50 MCG/ACT nasal spray, USE ONE SPRAY IN EACH NOSTRIL ONE TIME DAILY ** SHAKE BEFORE USING **, Disp: 16 mL, Rfl: 4   gabapentin (NEURONTIN) 400 MG capsule, Take 1 capsule (400 mg total) by mouth 3 (three) times daily., Disp: 90 capsule, Rfl: 2   lisinopril-hydrochlorothiazide (ZESTORETIC) 20-12.5 MG tablet, TAKE ONE TABLET BY MOUTH ONE TIME DAILY, Disp: 90 tablet, Rfl: 2   meloxicam (MOBIC) 15 MG tablet, May take 1 daily as needed., Disp: 90 tablet, Rfl: 0   methocarbamol (ROBAXIN) 500 MG tablet, TAKE ONE TABLET BY MOUTH EVERY 8 HOURS AS NEEDED FOR MUSCLE SPASM, Disp: 60 tablet, Rfl: 0   omeprazole (PRILOSEC) 20 MG capsule, TAKE ONE CAPSULE BY MOUTH ONE TIME DAILY, Disp: 90 capsule, Rfl: 1   Semaglutide,0.25 or 0.'5MG'$ /DOS, (OZEMPIC, 0.25 OR 0.5 MG/DOSE,) 2 MG/1.5ML SOPN, Inject into the skin. Patient has injections and prescription at weight loss clinic, Disp: , Rfl:    Vitamin D, Ergocalciferol, (DRISDOL) 1.25 MG (50000 UNIT) CAPS capsule, Take 50,000 Units by mouth every 7 (seven) days., Disp: , Rfl:    Objective:     BP 122/74 (BP Location: Right Arm, Patient Position: Sitting, Cuff Size: Normal)   Pulse 77   Temp 98 F (36.7 C) (Temporal)   Ht '5\' 6"'$  (1.676 m)   Wt 158 lb 9.6 oz (71.9 kg)  SpO2 96%   BMI 25.60 kg/m  Wt Readings from Last 3 Encounters:  06/25/22 158 lb 9.6 oz (71.9 kg)  01/01/22 164 lb (74.4 kg)  07/02/21 167 lb 3.2 oz (75.8 kg)      Physical Exam Constitutional:      General: She is not in acute distress.    Appearance: Normal appearance. She is not ill-appearing, toxic-appearing or diaphoretic.  HENT:     Head: Normocephalic and atraumatic.     Right Ear: External ear normal.     Left Ear: External ear normal.     Mouth/Throat:     Mouth: Mucous membranes are moist.     Pharynx: Oropharynx is clear. No oropharyngeal exudate or posterior oropharyngeal erythema.  Eyes:     General: No scleral icterus.        Right eye: No discharge.        Left eye: No discharge.     Extraocular Movements: Extraocular movements intact.     Conjunctiva/sclera: Conjunctivae normal.     Pupils: Pupils are equal, round, and reactive to light.  Neck:     Thyroid: No thyromegaly.  Cardiovascular:     Rate and Rhythm: Normal rate and regular rhythm.  Pulmonary:     Effort: Pulmonary effort is normal. No respiratory distress.     Breath sounds: Normal breath sounds.  Abdominal:     General: Bowel sounds are normal.     Tenderness: There is no abdominal tenderness. There is no guarding.  Musculoskeletal:     Cervical back: No rigidity or tenderness.  Lymphadenopathy:     Cervical: No cervical adenopathy.  Skin:    General: Skin is warm and dry.  Neurological:     Mental Status: She is alert and oriented to person, place, and time.  Psychiatric:        Mood and Affect: Mood normal.        Behavior: Behavior normal.      No results found for any visits on 06/25/22.    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  Discussed concerns about using semaglutide.  She is having to pay for it out-of-pocket.  We could possibly use it for her prediabetes.  She will let me know the dose that she is currently taking.  Ultrasound for cysts that were seen by her sister.  Libby Maw, MD

## 2022-07-23 ENCOUNTER — Ambulatory Visit
Admission: RE | Admit: 2022-07-23 | Discharge: 2022-07-23 | Disposition: A | Discharge status: Home / Self Care | Payer: Medicare Other | Source: Ambulatory Visit | Attending: Family Medicine | Admitting: Family Medicine

## 2022-07-23 ENCOUNTER — Other Ambulatory Visit: Payer: Self-pay | Admitting: Family Medicine

## 2022-07-23 DIAGNOSIS — E041 Nontoxic single thyroid nodule: Secondary | ICD-10-CM

## 2022-07-23 DIAGNOSIS — I10 Essential (primary) hypertension: Secondary | ICD-10-CM

## 2022-07-28 ENCOUNTER — Encounter: Payer: Self-pay | Admitting: *Deleted

## 2022-08-01 ENCOUNTER — Telehealth: Payer: Self-pay | Admitting: Family Medicine

## 2022-08-01 NOTE — Telephone Encounter (Signed)
Contacted Michele Hanson to schedule their annual wellness visit. Appointment made for 08/11/22.  Michele Hanson AWV direct phone # [phone]

## 2022-08-08 ENCOUNTER — Other Ambulatory Visit: Payer: Self-pay | Admitting: Family Medicine

## 2022-08-08 DIAGNOSIS — J301 Allergic rhinitis due to pollen: Secondary | ICD-10-CM

## 2022-08-11 ENCOUNTER — Ambulatory Visit (INDEPENDENT_AMBULATORY_CARE_PROVIDER_SITE_OTHER): Payer: Medicare Other

## 2022-08-11 VITALS — Ht 65.5 in | Wt 155.0 lb

## 2022-08-11 DIAGNOSIS — Z0000 Encounter for general adult medical examination without abnormal findings: Secondary | ICD-10-CM | POA: Diagnosis not present

## 2022-08-11 NOTE — Progress Notes (Signed)
I connected with  Addaline Peplinski on 08/11/22 by a audio enabled telemedicine application and verified that I am speaking with the correct person using two identifiers.  Patient Location: Home  Provider Location: Office/Clinic  I discussed the limitations of evaluation and management by telemedicine. The patient expressed understanding and agreed to proceed.  Subjective:   Michele Hanson is a 72 y.o. female who presents for Medicare Annual (Subsequent) preventive examination.  Patient Medicare AWV questionnaire was completed by the patient on 08/08/2022; I have confirmed that all information answered by patient is correct and no changes since this date.     Review of Systems     Cardiac Risk Factors include: advanced age (>64men, >45 women);hypertension     Objective:    Today's Vitals   08/11/22 1327  Weight: 155 lb (70.3 kg)  Height: 5' 5.5" (1.664 m)  PainSc: 3    Body mass index is 25.4 kg/m.     08/11/2022    1:32 PM 08/07/2021    3:02 PM 04/17/2021   11:14 AM 03/20/2021    7:05 AM 03/12/2021    2:07 PM 01/31/2020   11:22 AM 01/26/2019    8:55 AM  Advanced Directives  Does Patient Have a Medical Advance Directive? Yes Yes Yes Yes Yes No No  Type of Estate agent of [place];Living will Healthcare Power of [place];Living will Healthcare Power of [place];Living will  Living will;Healthcare Power of Attorney    Does patient want to make changes to medical advance directive?   No - Patient declined No - Patient declined     Copy of Healthcare Power of Attorney in Chart? No - copy requested No - copy requested       Would patient like information on creating a medical advance directive?      No - Patient declined No - Patient declined    Current Medications (verified) Outpatient Encounter Medications as of 08/11/2022  Medication Sig   aspirin 325 MG tablet Take by mouth.   atorvastatin (LIPITOR) 20 MG tablet TAKE ONE TABLET BY MOUTH ONE TIME DAILY    Azelastine HCl 137 MCG/SPRAY SOLN SPRAY ONE SPRAY IN EACH NOSTRIL ONE TIME DAILY AS NEEDED   Brimonidine Tartrate (LUMIFY) 0.025 % SOLN Apply to eye.   citalopram (CELEXA) 20 MG tablet TAKE ONE TABLET BY MOUTH ONE TIME DAILY   denosumab (PROLIA) 60 MG/ML SOSY injection Inject 60 mg into the skin every 6 (six) months.   estradiol (ESTRACE) 0.1 MG/GM vaginal cream    fluticasone (FLONASE) 50 MCG/ACT nasal spray USE ONE SPRAY IN EACH NOSTRIL ONE TIME DAILY ** SHAKE BEFORE USING **   gabapentin (NEURONTIN) 400 MG capsule Take 1 capsule (400 mg total) by mouth 3 (three) times daily.   lisinopril-hydrochlorothiazide (ZESTORETIC) 20-12.5 MG tablet TAKE ONE TABLET BY MOUTH ONE TIME DAILY   meloxicam (MOBIC) 15 MG tablet May take 1 daily as needed.   methocarbamol (ROBAXIN) 500 MG tablet TAKE ONE TABLET BY MOUTH EVERY 8 HOURS AS NEEDED FOR MUSCLE SPASM   omeprazole (PRILOSEC) 20 MG capsule TAKE ONE CAPSULE BY MOUTH ONE TIME DAILY   Semaglutide,0.25 or 0.5MG /DOS, (OZEMPIC, 0.25 OR 0.5 MG/DOSE,) 2 MG/1.5ML SOPN Inject into the skin. Patient has injections and prescription at weight loss clinic   Vitamin D, Ergocalciferol, (DRISDOL) 1.25 MG (50000 UNIT) CAPS capsule Take 50,000 Units by mouth every 7 (seven) days.   No facility-administered encounter medications on file as of 08/11/2022.    Allergies (verified) Ciprofloxacin, Neomycin,  and Nickel   History: Past Medical History:  Diagnosis Date   Anxiety    Arthritis    Depression    Fatty liver disease, nonalcoholic    GERD (gastroesophageal reflux disease)    Hepatitis 1973   hep c-was treated   Hyperlipemia    Hypertension    Past Surgical History:  Procedure Laterality Date   BACK SURGERY     CARPAL TUNNEL RELEASE Bilateral    CHOLECYSTECTOMY     DIAGNOSTIC LAPAROSCOPY     INCONTINENCE SURGERY     LAPAROSCOPIC LYSIS OF ADHESIONS     REVERSE SHOULDER ARTHROPLASTY Right 03/20/2021   Procedure: REVERSE SHOULDER ARTHROPLASTY;  Surgeon:  Bjorn Pippin, MD;  Location: [facility];  Service: Orthopedics;  Laterality: Right;   Family History  Family history unknown: Yes   Social History   Socioeconomic History   Marital status: Married    Spouse name: Not on file   Number of children: Not on file   Years of education: Not on file   Highest education level: Not on file  Occupational History   Not on file  Tobacco Use   Smoking status: Never   Smokeless tobacco: Never  Vaping Use   Vaping Use: Never used  Substance and Sexual Activity   Alcohol use: Yes    Comment: 1-2 alcoholic drinks daily   Drug use: Never   Sexual activity: Yes    Birth control/protection: Post-menopausal  Other Topics Concern   Not on file  Social History Narrative   Not on file   Social Determinants of Health   Financial Resource Strain: Low Risk  (08/08/2022)   Overall Financial Resource Strain (CARDIA)    Difficulty of Paying Living Expenses: Not hard at all  Food Insecurity: No Food Insecurity (08/08/2022)   Hunger Vital Sign    Worried About Running Out of Food in the Last Year: Never true    Ran Out of Food in the Last Year: Never true  Transportation Needs: No Transportation Needs (08/08/2022)   PRAPARE - Administrator, Civil Service (Medical): No    Lack of Transportation (Non-Medical): No  Physical Activity: Sufficiently Active (08/08/2022)   Exercise Vital Sign    Days of Exercise per Week: 5 days    Minutes of Exercise per Session: 30 min  Stress: No Stress Concern Present (08/08/2022)   [employer] of Occupational Health - Occupational Stress Questionnaire    Feeling of Stress : Not at all  Social Connections: Unknown (08/08/2022)   Social Connection and Isolation Panel [NHANES]    Frequency of Communication with Friends and Family: Three times a week    Frequency of Social Gatherings with Friends and Family: Once a week    Attends Religious Services: Not on Marketing executive  or Organizations: Yes    Attends Engineer, structural: More than 4 times per year    Marital Status: Married    Tobacco Counseling Counseling given: Not Answered   Clinical Intake:  Pre-visit preparation completed: Yes  Pain : 0-10 Pain Score: 3  Pain Type: Chronic pain Pain Location: Back Pain Orientation: Lower Pain Descriptors / Indicators: Aching Pain Onset: More than a month ago Pain Frequency: Constant Pain Relieving Factors: goes down legs  Pain Relieving Factors: goes down legs  Nutritional Status: BMI 25 -29 Overweight Nutritional Risks: None Diabetes: No  How often do you need to have someone help you when you  read instructions, pamphlets, or other written materials from your doctor or pharmacy?: 1 - Never  Diabetic? no  Interpreter Needed?: No  Information entered by :: NAllen LPN   Activities of Daily Living    08/08/2022    1:48 PM  In your present state of health, do you have any difficulty performing the following activities:  Hearing? 0  Vision? 0  Difficulty concentrating or making decisions? 0  Walking or climbing stairs? 0  Dressing or bathing? 0  Doing errands, shopping? 0  Preparing Food and eating ? N  Using the Toilet? N  In the past six months, have you accidently leaked urine? Y  Do you have problems with loss of bowel control? N  Managing your Medications? N  Managing your Finances? N  Housekeeping or managing your Housekeeping? N    Patient Care Team: Mliss Sax, MD as PCP - General (Family Medicine)  Indicate any recent Medical Services you may have received from other than [facility] providers in the past year (date may be approximate).     Assessment:   This is a routine wellness examination for Zariyah.  Hearing/Vision screen Vision Screening - Comments:: Regular eye exams, [facility]  Dietary issues and exercise activities discussed: Current Exercise Habits: Home exercise routine, Type of  exercise: walking, Time (Minutes): 30, Frequency (Times/Week): 5, Weekly Exercise (Minutes/Week): 150   Goals Addressed             This Visit's Progress    Patient Stated       08/11/2022, wants to lose weight       Depression Screen    08/11/2022    1:32 PM 06/25/2022   11:16 AM 08/07/2021    3:03 PM 08/07/2021    3:00 PM 07/02/2021   11:15 AM 12/27/2020   11:02 AM 12/27/2020   10:10 AM  PHQ 2/9 Scores  PHQ - 2 Score 0 0 0 0 0 0 0  PHQ- 9 Score      1     Fall Risk    08/08/2022    1:48 PM 06/25/2022   11:16 AM 08/07/2021    3:03 PM 07/02/2021   11:14 AM 12/27/2020   10:10 AM  Fall Risk   Falls in the past year? 0 0 0 1 0  Comment     dog caused a fall  Number falls in past yr: 0 0 0 0   Injury with Fall? 0 0 0 0   Risk for fall due to : Medication side effect No Fall Risks     Follow up Falls prevention discussed;Education provided;Falls evaluation completed Falls evaluation completed Falls evaluation completed      FALL RISK PREVENTION PERTAINING TO THE HOME:  Any stairs in or around the home? Yes  If so, are there any without handrails? No  Home free of loose throw rugs in walkways, pet beds, electrical cords, etc? Yes  Adequate lighting in your home to reduce risk of falls? Yes   ASSISTIVE DEVICES UTILIZED TO PREVENT FALLS:  Life alert? No  Use of a cane, walker or w/c? No  Grab bars in the bathroom? No  Shower chair or bench in shower? Yes  Elevated toilet seat or a handicapped toilet? No   TIMED UP AND GO:  Was the test performed? No .      Cognitive Function:        08/11/2022    1:33 PM  6CIT Screen  What Year? 0  points  What month? 0 points  What time? 0 points  Count back from 20 0 points  Months in reverse 0 points  Repeat phrase 2 points  Total Score 2 points    Immunizations Immunization History  Administered Date(s) Administered   Fluad Quad(high Dose 65+) 01/18/2019   Influenza, High Dose Seasonal PF 03/16/2018, 12/27/2020,  01/01/2022   Influenza-Unspecified 03/14/2020   Moderna Sars-Covid-2 Vaccination 04/03/2020   PFIZER(Purple Top)SARS-COV-2 Vaccination 07/28/2019, 08/18/2019   PNEUMOCOCCAL CONJUGATE-20 08/31/2021   Tdap 04/13/2018   Zoster Recombinat (Shingrix) 03/14/2020, 07/08/2020    TDAP status: Up to date  Flu Vaccine status: Up to date  Pneumococcal vaccine status: Up to date  Covid-19 vaccine status: Completed vaccines  Qualifies for Shingles Vaccine? Yes   Zostavax completed Yes   Shingrix Completed?: Yes  Screening Tests Health Maintenance  Topic Date Due   MAMMOGRAM  08/26/2021   Medicare Annual Wellness (AWV)  08/08/2022   INFLUENZA VACCINE  11/13/2022   DTaP/Tdap/Td (2 - Td or Tdap) 04/13/2028   COLONOSCOPY (Pts 45-73yrs Insurance coverage will need to be confirmed)  09/11/2031   Pneumonia Vaccine 65+ Years old  Completed   DEXA SCAN  Completed   Hepatitis C Screening  Completed   Zoster Vaccines- Shingrix  Completed   HPV VACCINES  Aged Out   COVID-19 Vaccine  Discontinued    Health Maintenance  Health Maintenance Due  Topic Date Due   MAMMOGRAM  08/26/2021   Medicare Annual Wellness (AWV)  08/08/2022    Colorectal cancer screening: Type of screening: Colonoscopy. Completed 09/10/2021. Repeat every 3 years  Mammogram status: Completed 2023. Repeat every year  Bone Density status: Completed 07/25/2021.   Lung Cancer Screening: (Low Dose CT Chest recommended if Age 21-80 years, 30 pack-year currently smoking OR have quit w/in 15years.) does not qualify.   Lung Cancer Screening Referral: no  Additional Screening:  Hepatitis C Screening: does qualify; Completed 07/02/2021  Vision Screening: Recommended annual ophthalmology exams for early detection of glaucoma and other disorders of the eye. Is the patient up to date with their annual eye exam?  Yes  Who is the provider or what is the name of the office in which the patient attends annual eye exams? [facility] If pt is not established with a provider, would they like to be referred to a provider to establish care? No .   Dental Screening: Recommended annual dental exams for proper oral hygiene  Community Resource Referral / Chronic Care Management: CRR required this visit?  No   CCM required this visit?  No      Plan:     I have personally reviewed and noted the following in the patient's chart:   Medical and social history Use of alcohol, tobacco or illicit drugs  Current medications and supplements including opioid prescriptions. Patient is not currently taking opioid prescriptions. Functional ability and status Nutritional status Physical activity Advanced directives List of other physicians Hospitalizations, surgeries, and ER visits in previous 12 months Vitals Screenings to include cognitive, depression, and falls Referrals and appointments  In addition, I have reviewed and discussed with patient certain preventive protocols, quality metrics, and best practice recommendations. A written personalized care plan for preventive services as well as general preventive health recommendations were provided to patient.     Barb Merino, LPN   07/21/8117   Nurse Notes: none  Due to this being a virtual visit, the after visit summary with patients personalized plan was offered  to patient via mail or my-chart. Patient would like to access on my-chart

## 2022-08-11 NOTE — Patient Instructions (Signed)
[name] , Thank you for taking time to come for your Medicare Wellness Visit. I appreciate your ongoing commitment to your health goals. Please review the following plan we discussed and let me know if I can assist you in the future.   These are the goals we discussed:  Goals      Patient Stated     Maintain healthy active lifestyle.      Patient Stated     Lose weight with Noom diet & increasing activity     Patient Stated     08/11/2022, wants to lose weight        This is a list of the screening recommended for you and due dates:  Health Maintenance  Topic Date Due   Mammogram  08/26/2021   Flu Shot  11/13/2022   Medicare Annual Wellness Visit  08/11/2023   DTaP/Tdap/Td vaccine (2 - Td or Tdap) 04/13/2028   Colon Cancer Screening  09/11/2031   Pneumonia Vaccine  Completed   DEXA scan (bone density measurement)  Completed   Hepatitis C Screening: USPSTF Recommendation to screen - Ages 53-79 yo.  Completed   Zoster (Shingles) Vaccine  Completed   HPV Vaccine  Aged Out   COVID-19 Vaccine  Discontinued    Advanced directives: Please bring a copy of your POA (Power of [place]) and/or Living Will to your next appointment.   Conditions/risks identified: none  Next appointment: Follow up in one year for your annual wellness visit    Preventive Care 65 Years and Older, Female Preventive care refers to lifestyle choices and visits with your health care provider that can promote health and wellness. What does preventive care include? A yearly physical exam. This is also called an annual well check. Dental exams once or twice a year. Routine eye exams. Ask your health care provider how often you should have your eyes checked. Personal lifestyle choices, including: Daily care of your teeth and gums. Regular physical activity. Eating a healthy diet. Avoiding tobacco and drug use. Limiting alcohol use. Practicing safe sex. Taking low-dose aspirin every day. Taking  vitamin and mineral supplements as recommended by your health care provider. What happens during an annual well check? The services and screenings done by your health care provider during your annual well check will depend on your age, overall health, lifestyle risk factors, and family history of disease. Counseling  Your health care provider may ask you questions about your: Alcohol use. Tobacco use. Drug use. Emotional well-being. Home and relationship well-being. Sexual activity. Eating habits. History of falls. Memory and ability to understand (cognition). Work and work Astronomer. Reproductive health. Screening  You may have the following tests or measurements: Height, weight, and BMI. Blood pressure. Lipid and cholesterol levels. These may be checked every 5 years, or more frequently if you are over 37 years old. Skin check. Lung cancer screening. You may have this screening every year starting at age 54 if you have a 30-pack-year history of smoking and currently smoke or have quit within the past 15 years. Fecal occult blood test (FOBT) of the stool. You may have this test every year starting at age 12. Flexible sigmoidoscopy or colonoscopy. You may have a sigmoidoscopy every 5 years or a colonoscopy every 10 years starting at age 19. Hepatitis C blood test. Hepatitis B blood test. Sexually transmitted disease (STD) testing. Diabetes screening. This is done by checking your blood sugar (glucose) after you have not eaten for a while (fasting). You may have  this done every 1-3 years. Bone density scan. This is done to screen for osteoporosis. You may have this done starting at age 35. Mammogram. This may be done every 1-2 years. Talk to your health care provider about how often you should have regular mammograms. Talk with your health care provider about your test results, treatment options, and if necessary, the need for more tests. Vaccines  Your health care provider may  recommend certain vaccines, such as: Influenza vaccine. This is recommended every year. Tetanus, diphtheria, and acellular pertussis (Tdap, Td) vaccine. You may need a Td booster every 10 years. Zoster vaccine. You may need this after age 37. Pneumococcal 13-valent conjugate (PCV13) vaccine. One dose is recommended after age 53. Pneumococcal polysaccharide (PPSV23) vaccine. One dose is recommended after age 35. Talk to your health care provider about which screenings and vaccines you need and how often you need them. This information is not intended to replace advice given to you by your health care provider. Make sure you discuss any questions you have with your health care provider. Document Released: 04/27/2015 Document Revised: 12/19/2015 Document Reviewed: 01/30/2015 Elsevier Interactive Patient Education  2017 [employer] Prevention in the Home Falls can cause injuries. They can happen to people of all ages. There are many things you can do to make your home safe and to help prevent falls. What can I do on the outside of my home? Regularly fix the edges of walkways and driveways and fix any cracks. Remove anything that might make you trip as you walk through a door, such as a raised step or threshold. Trim any bushes or trees on the path to your home. Use bright outdoor lighting. Clear any walking paths of anything that might make someone trip, such as rocks or tools. Regularly check to see if handrails are loose or broken. Make sure that both sides of any steps have handrails. Any raised decks and porches should have guardrails on the edges. Have any leaves, snow, or ice cleared regularly. Use sand or salt on walking paths during winter. Clean up any spills in your garage right away. This includes oil or grease spills. What can I do in the bathroom? Use night lights. Install grab bars by the toilet and in the tub and shower. Do not use towel bars as grab bars. Use non-skid  mats or decals in the tub or shower. If you need to sit down in the shower, use a plastic, non-slip stool. Keep the floor dry. Clean up any water that spills on the floor as soon as it happens. Remove soap buildup in the tub or shower regularly. Attach bath mats securely with double-sided non-slip rug tape. Do not have throw rugs and other things on the floor that can make you trip. What can I do in the bedroom? Use night lights. Make sure that you have a light by your bed that is easy to reach. Do not use any sheets or blankets that are too big for your bed. They should not hang down onto the floor. Have a firm chair that has side arms. You can use this for support while you get dressed. Do not have throw rugs and other things on the floor that can make you trip. What can I do in the kitchen? Clean up any spills right away. Avoid walking on wet floors. Keep items that you use a lot in easy-to-reach places. If you need to reach something above you, use a strong step stool  that has a grab bar. Keep electrical cords out of the way. Do not use floor polish or wax that makes floors slippery. If you must use wax, use non-skid floor wax. Do not have throw rugs and other things on the floor that can make you trip. What can I do with my stairs? Do not leave any items on the stairs. Make sure that there are handrails on both sides of the stairs and use them. Fix handrails that are broken or loose. Make sure that handrails are as long as the stairways. Check any carpeting to make sure that it is firmly attached to the stairs. Fix any carpet that is loose or worn. Avoid having throw rugs at the top or bottom of the stairs. If you do have throw rugs, attach them to the floor with carpet tape. Make sure that you have a light switch at the top of the stairs and the bottom of the stairs. If you do not have them, ask someone to add them for you. What else can I do to help prevent falls? Wear shoes  that: Do not have high heels. Have rubber bottoms. Are comfortable and fit you well. Are closed at the toe. Do not wear sandals. If you use a stepladder: Make sure that it is fully opened. Do not climb a closed stepladder. Make sure that both sides of the stepladder are locked into place. Ask someone to hold it for you, if possible. Clearly mark and make sure that you can see: Any grab bars or handrails. First and last steps. Where the edge of each step is. Use tools that help you move around (mobility aids) if they are needed. These include: Canes. Walkers. Scooters. Crutches. Turn on the lights when you go into a dark area. Replace any light bulbs as soon as they burn out. Set up your furniture so you have a clear path. Avoid moving your furniture around. If any of your floors are uneven, fix them. If there are any pets around you, be aware of where they are. Review your medicines with your doctor. Some medicines can make you feel dizzy. This can increase your chance of falling. Ask your doctor what other things that you can do to help prevent falls. This information is not intended to replace advice given to you by your health care provider. Make sure you discuss any questions you have with your health care provider. Document Released: 01/25/2009 Document Revised: 09/06/2015 Document Reviewed: 05/05/2014 Elsevier Interactive Patient Education  2017 [employer].

## 2022-08-27 ENCOUNTER — Other Ambulatory Visit: Payer: Self-pay | Admitting: Family Medicine

## 2022-08-27 DIAGNOSIS — G629 Polyneuropathy, unspecified: Secondary | ICD-10-CM

## 2022-09-12 LAB — HM MAMMOGRAPHY

## 2022-09-29 ENCOUNTER — Other Ambulatory Visit: Payer: Self-pay | Admitting: Family Medicine

## 2022-09-29 DIAGNOSIS — F418 Other specified anxiety disorders: Secondary | ICD-10-CM

## 2022-09-29 DIAGNOSIS — M129 Arthropathy, unspecified: Secondary | ICD-10-CM

## 2022-09-29 DIAGNOSIS — M48062 Spinal stenosis, lumbar region with neurogenic claudication: Secondary | ICD-10-CM

## 2022-10-06 ENCOUNTER — Encounter: Payer: Self-pay | Admitting: Family Medicine

## 2022-10-06 ENCOUNTER — Ambulatory Visit (INDEPENDENT_AMBULATORY_CARE_PROVIDER_SITE_OTHER): Payer: Medicare Other | Admitting: Family Medicine

## 2022-10-06 VITALS — BP 118/64 | HR 77 | Temp 97.6°F | Ht 65.0 in | Wt 152.4 lb

## 2022-10-06 DIAGNOSIS — M129 Arthropathy, unspecified: Secondary | ICD-10-CM

## 2022-10-06 DIAGNOSIS — K4190 Unilateral femoral hernia, without obstruction or gangrene, not specified as recurrent: Secondary | ICD-10-CM | POA: Diagnosis not present

## 2022-10-06 DIAGNOSIS — M48062 Spinal stenosis, lumbar region with neurogenic claudication: Secondary | ICD-10-CM

## 2022-10-06 MED ORDER — METHOCARBAMOL 500 MG PO TABS: ORAL_TABLET | ORAL | 0 refills | Status: DC

## 2022-10-06 MED ORDER — MELOXICAM 15 MG PO TABS
ORAL_TABLET | ORAL | 0 refills | Status: DC
Start: 2022-10-06 — End: 2023-12-31

## 2022-10-06 NOTE — Addendum Note (Signed)
Addended by: Andrez Grime on: 10/06/2022 02:35 PM   Modules accepted: Orders

## 2022-10-06 NOTE — Progress Notes (Addendum)
Established Patient Office Visit   Subjective:  Patient ID: Michele Hanson, female    DOB: 1950/09/10  Age: 72 y.o. MRN: [id number]  Chief Complaint  Patient presents with   Abdominal Pain    Pt complains of left abdomnila pain x 2 weeks. Pt has occasional pulling sensation. Pt also complains of left lumbar pain.     Abdominal Pain Pertinent negatives include no melena or myalgias.   Encounter Diagnoses  Name Primary?   Unilateral femoral hernia without obstruction or gangrene, recurrence not specified Yes   Spinal stenosis of lumbar region with neurogenic claudication    Arthritis involving multiple sites    Has noted a bulge in her left groin area over the last several weeks.  Tends to be more prominent when she stands.  It was more prominent after a bout of constipation.  There is some pain in the area.  There is been no nausea or vomiting.  No blood in her stools.  History of quiescent ovarian cyst followed by her GYN doctor.  Seeing pain management for ongoing back pain.   Review of Systems  Constitutional: Negative.   HENT: Negative.    Eyes:  Negative for blurred vision, discharge and redness.  Respiratory: Negative.    Cardiovascular: Negative.   Gastrointestinal:  Positive for abdominal pain. Negative for blood in stool and melena.  Genitourinary: Negative.   Musculoskeletal: Negative.  Negative for myalgias.  Skin:  Negative for rash.  Neurological:  Negative for tingling, loss of consciousness and weakness.  Endo/Heme/Allergies:  Negative for polydipsia.     Current Outpatient Medications:    aspirin 325 MG tablet, Take by mouth., Disp: , Rfl:    atorvastatin (LIPITOR) 20 MG tablet, TAKE ONE TABLET BY MOUTH ONE TIME DAILY, Disp: 90 tablet, Rfl: 3   Azelastine HCl 137 MCG/SPRAY SOLN, SPRAY ONE SPRAY IN EACH NOSTRIL ONE TIME DAILY AS NEEDED, Disp: 30 mL, Rfl: 3   Brimonidine Tartrate (LUMIFY) 0.025 % SOLN, Apply to eye., Disp: , Rfl:    citalopram (CELEXA) 20 MG  tablet, TAKE ONE TABLET BY MOUTH ONE TIME DAILY, Disp: 90 tablet, Rfl: 1   denosumab (PROLIA) 60 MG/ML SOSY injection, Inject 60 mg into the skin every 6 (six) months., Disp: , Rfl:    fluticasone (FLONASE) 50 MCG/ACT nasal spray, USE ONE SPRAY IN EACH NOSTRIL ONE TIME DAILY ** SHAKE BEFORE USING **, Disp: 16 mL, Rfl: 4   gabapentin (NEURONTIN) 400 MG capsule, TAKE ONE CAPSULE BY MOUTH THREE TIMES A DAY, Disp: 90 capsule, Rfl: 2   lisinopril-hydrochlorothiazide (ZESTORETIC) 20-12.5 MG tablet, TAKE ONE TABLET BY MOUTH ONE TIME DAILY, Disp: 90 tablet, Rfl: 2   omeprazole (PRILOSEC) 20 MG capsule, TAKE ONE CAPSULE BY MOUTH ONE TIME DAILY, Disp: 90 capsule, Rfl: 1   Semaglutide,0.25 or 0.5MG /DOS, (OZEMPIC, 0.25 OR 0.5 MG/DOSE,) 2 MG/1.5ML SOPN, Inject into the skin. Patient has injections and prescription at weight loss clinic, Disp: , Rfl:    Vitamin D, Ergocalciferol, (DRISDOL) 1.25 MG (50000 UNIT) CAPS capsule, Take 50,000 Units by mouth every 7 (seven) days., Disp: , Rfl:    estradiol (ESTRACE) 0.1 MG/GM vaginal cream, , Disp: , Rfl:    meloxicam (MOBIC) 15 MG tablet, May take 1 daily as needed., Disp: 90 tablet, Rfl: 0   methocarbamol (ROBAXIN) 500 MG tablet, TAKE ONE TABLET BY MOUTH EVERY 8 HOURS AS NEEDED FOR MUSCLE SPASM, Disp: 60 tablet, Rfl: 0   Objective:     BP 118/64  Pulse 77   Temp 97.6 F (36.4 C)   Ht 5\' 5"  (1.651 m)   Wt 152 lb 6.4 oz (69.1 kg)   SpO2 96%   BMI 25.36 kg/m    Physical Exam Constitutional:      General: She is not in acute distress.    Appearance: Normal appearance. She is not ill-appearing, toxic-appearing or diaphoretic.  HENT:     Head: Normocephalic and atraumatic.     Right Ear: External ear normal.     Left Ear: External ear normal.  Eyes:     General: No scleral icterus.       Right eye: No discharge.        Left eye: No discharge.     Extraocular Movements: Extraocular movements intact.     Conjunctiva/sclera: Conjunctivae normal.   Pulmonary:     Effort: Pulmonary effort is normal. No respiratory distress.  Abdominal:     General: Abdomen is flat. Bowel sounds are normal.     Palpations: Abdomen is soft.     Hernia: A hernia is present. Hernia is present in the left femoral area (?).  Skin:    General: Skin is warm and dry.  Neurological:     Mental Status: She is alert and oriented to person, place, and time.  Psychiatric:        Mood and Affect: Mood normal.        Behavior: Behavior normal.      No results found for any visits on 10/06/22.    The 10-year ASCVD risk score (Arnett DK, et al., 2019) is: 11.1%    Assessment & Plan:   Unilateral femoral hernia without obstruction or gangrene, recurrence not specified -     US PELVIS LIMITED (TRANSABDOMINAL ONLY); Future  Spinal stenosis of lumbar region with neurogenic claudication -     Methocarbamol; TAKE ONE TABLET BY MOUTH EVERY 8 HOURS AS NEEDED FOR MUSCLE SPASM  Dispense: 60 tablet; Refill: 0  Arthritis involving multiple sites -     Meloxicam; May take 1 daily as needed.  Dispense: 90 tablet; Refill: 0    Return We will consider general surgery consultation pending results..  Information on health femoral hernia given to patient.    Will follow-up with pain management for future refills of Robaxin and meloxicam.   Mliss Sax, MD

## 2022-10-13 ENCOUNTER — Other Ambulatory Visit: Payer: Self-pay | Admitting: Family Medicine

## 2022-10-21 ENCOUNTER — Ambulatory Visit (HOSPITAL_BASED_OUTPATIENT_CLINIC_OR_DEPARTMENT_OTHER)
Admission: RE | Admit: 2022-10-21 | Discharge: 2022-10-21 | Disposition: A | Discharge status: Home / Self Care | Payer: Medicare Other | Source: Ambulatory Visit | Attending: Family Medicine | Admitting: Family Medicine

## 2022-10-21 DIAGNOSIS — K4190 Unilateral femoral hernia, without obstruction or gangrene, not specified as recurrent: Secondary | ICD-10-CM | POA: Diagnosis present

## 2022-10-24 ENCOUNTER — Telehealth: Payer: Self-pay | Admitting: Family Medicine

## 2022-10-24 NOTE — Telephone Encounter (Signed)
Pt is wanting a cb concerning her US Pelvis Limited (Accession [id number]) (Order [id number]). Please advise pt at [phone].

## 2022-10-24 NOTE — Telephone Encounter (Signed)
This is meant for team Dr Doreene Burke

## 2022-10-28 ENCOUNTER — Encounter: Payer: Self-pay | Admitting: Family Medicine

## 2022-10-28 DIAGNOSIS — K4190 Unilateral femoral hernia, without obstruction or gangrene, not specified as recurrent: Secondary | ICD-10-CM

## 2022-11-07 ENCOUNTER — Other Ambulatory Visit: Payer: Self-pay | Admitting: Family Medicine

## 2022-11-07 DIAGNOSIS — J301 Allergic rhinitis due to pollen: Secondary | ICD-10-CM

## 2022-11-17 ENCOUNTER — Other Ambulatory Visit: Payer: Self-pay

## 2022-11-17 DIAGNOSIS — M810 Age-related osteoporosis without current pathological fracture: Secondary | ICD-10-CM | POA: Insufficient documentation

## 2022-11-20 ENCOUNTER — Telehealth: Payer: Self-pay | Admitting: Pharmacy Technician

## 2022-11-20 NOTE — Telephone Encounter (Addendum)
 Auth Submission: APPROVED Site of care: Site of care: [facility] Payer: [facility] MEDICARE Medication & CPT/J Code(s) submitted: Prolia  (Denosumab ) [id number] Route of submission (phone, fax, portal): PORTAL Phone # Fax # Auth type: Buy/Bill PB Units/visits requested: 2 Reference number: [id number] Approval from: 11/20/22 to 11/20/23   PA RENEWAL APPROVED [id number] 11/05/23 11/04/24

## 2022-11-23 ENCOUNTER — Other Ambulatory Visit: Payer: Self-pay | Admitting: Family Medicine

## 2022-11-23 DIAGNOSIS — G629 Polyneuropathy, unspecified: Secondary | ICD-10-CM

## 2022-11-25 ENCOUNTER — Ambulatory Visit (INDEPENDENT_AMBULATORY_CARE_PROVIDER_SITE_OTHER): Payer: Medicare Other

## 2022-11-25 VITALS — BP 109/71 | HR 71 | Temp 97.8°F | Resp 17 | Ht 65.5 in | Wt 151.8 lb

## 2022-11-25 DIAGNOSIS — M810 Age-related osteoporosis without current pathological fracture: Secondary | ICD-10-CM

## 2022-11-25 MED ORDER — DENOSUMAB 60 MG/ML ~~LOC~~ SOSY
60.0000 mg | PREFILLED_SYRINGE | Freq: Once | SUBCUTANEOUS | Status: AC
  Administered 2022-11-25: 60 mg via SUBCUTANEOUS
  Filled 2022-11-25: qty 1

## 2022-11-25 NOTE — Progress Notes (Signed)
Diagnosis: Osteoporosis  Provider:  Mannam, Praveen MD  Procedure: Injection  Prolia (Denosumab), Dose: 60 mg, Site: subcutaneous, Number of injections: 1  Post Care: Patient declined observation  Discharge: Condition: Good, Destination: Home . AVS Declined  Performed by:  Sunita Ranabhat, RN       

## 2022-11-27 ENCOUNTER — Encounter (INDEPENDENT_AMBULATORY_CARE_PROVIDER_SITE_OTHER): Payer: Self-pay

## 2022-12-17 NOTE — Progress Notes (Addendum)
Anesthesia Review:  PCP: DR Yves Dill  Cardiologist : Chest x-ray : EKG 12/31/2022  Echo : Stress test: Cardiac Cath :  Activity level: can do a flgiht of stairs without difficulty  Sleep Study/ CPAP : none  Fasting Blood Sugar :      / Checks Blood Sugar -- times a day:   Blood Thinner/ Instructions /Last Dose: ASA / Instructions/ Last Dose :    325 mg aspirin- PT aware to call MD in regards preop ASA orders.  pT voiced understanding.      Ozempic- PT aware to stop 7 days prior to surgery  NICKEL  ALLERGY

## 2022-12-17 NOTE — Patient Instructions (Addendum)
SURGICAL WAITING ROOM VISITATION  Patients having surgery or a procedure may have no more than 2 support people in the waiting area - these visitors may rotate.    Children under the age of 60 must have an adult with them who is not the patient.  Due to an increase in RSV and influenza rates and associated hospitalizations, children ages 53 and under may not visit patients in [facility] hospitals.  If the patient needs to stay at the hospital during part of their recovery, the visitor guidelines for inpatient rooms apply. Pre-op nurse will coordinate an appropriate time for 1 support person to accompany patient in pre-op.  This support person may not rotate.    Please refer to the [facility] website for the visitor guidelines for Inpatients (after your surgery is over and you are in a regular room).       Your procedure is scheduled on:  01/12/2023    Report to [facility] Main Entrance    Report to admitting at  1030 AM   Call this number if you have problems the morning of surgery [phone]   Do not eat food :After Midnight.   After Midnight you may have the following liquids until __ 0930____ AM/DAY OF SURGERY  Water Non-Citrus Juices (without pulp, NO RED-Apple, White grape, White cranberry) Black Coffee (NO MILK/CREAM OR CREAMERS, sugar ok)  Clear Tea (NO MILK/CREAM OR CREAMERS, sugar ok) regular and decaf                             Plain Jell-O (NO RED)                                           Fruit ices (not with fruit pulp, NO RED)                                     Popsicles (NO RED)                                                               Sports drinks like Gatorade (NO RED)                   The day of surgery:  Drink ONE (1) Pre-Surgery Clear Ensure or G2 at   0930AM (  have completed by )  the morning of surgery. Drink in one sitting. Do not sip.  This drink was given to you during your hospital  pre-op appointment visit. Nothing else to  drink after completing the  Pre-Surgery Clear Ensure or G2.          If you have questions, please contact your surgeon's office.     Oral Hygiene is also important to reduce your risk of infection.                                    Remember - BRUSH YOUR TEETH THE MORNING OF SURGERY WITH YOUR REGULAR TOOTHPASTE  DENTURES WILL BE REMOVED PRIOR TO SURGERY PLEASE DO NOT APPLY "Poly grip" OR ADHESIVES!!!   Do NOT smoke after Midnight   Stop all vitamins and herbal supplements 7 days before surgery.   Take these medicines the morning of surgery with A SIP OF WATER:  eye drops as usual, celexa, flonase if needed, gabapentin, omeprazole   DO NOT TAKE ANY ORAL DIABETIC MEDICATIONS DAY OF YOUR SURGERY  Bring CPAP mask and tubing day of surgery.                              You may not have any metal on your body including hair pins, jewelry, and body piercing             Do not wear make-up, lotions, powders, perfumes/cologne, or deodorant  Do not wear nail polish including gel and S&S, artificial/acrylic nails, or any other type of covering on natural nails including finger and toenails. If you have artificial nails, gel coating, etc. that needs to be removed by a nail salon please have this removed prior to surgery or surgery may need to be canceled/ delayed if the surgeon/ anesthesia feels like they are unable to be safely monitored.   Do not shave  48 hours prior to surgery.               Men may shave face and neck.   Do not bring valuables to the hospital. [facility] IS NOT             RESPONSIBLE   FOR VALUABLES.   Contacts, glasses, dentures or bridgework may not be worn into surgery.   Bring small overnight bag day of surgery.   DO NOT BRING YOUR HOME MEDICATIONS TO THE HOSPITAL. PHARMACY WILL DISPENSE MEDICATIONS LISTED ON YOUR MEDICATION LIST TO YOU DURING YOUR ADMISSION IN THE HOSPITAL!    Patients discharged on the day of surgery will not be allowed to drive home.   Someone NEEDS to stay with you for the first 24 hours after anesthesia.   Special Instructions: Bring a copy of your healthcare power of attorney and living will documents the day of surgery if you haven't scanned them before.              Please read over the following fact sheets you were given: IF YOU HAVE QUESTIONS ABOUT YOUR PRE-OP INSTRUCTIONS PLEASE CALL [phone]   If you received a COVID test during your pre-op visit  it is requested that you wear a mask when out in public, stay away from anyone that may not be feeling well and notify your surgeon if you develop symptoms. If you test positive for Covid or have been in contact with anyone that has tested positive in the last 10 days please notify you surgeon.    [facility] - Preparing for Surgery Before surgery, you can play an important role.  Because skin is not sterile, your skin needs to be as free of germs as possible.  You can reduce the number of germs on your skin by washing with CHG (chlorahexidine gluconate) soap before surgery.  CHG is an antiseptic cleaner which kills germs and bonds with the skin to continue killing germs even after washing. Please DO NOT use if you have an allergy to CHG or antibacterial soaps.  If your skin becomes reddened/irritated stop using the CHG and inform your nurse when you arrive at Short Stay. Do  not shave (including legs and underarms) for at least 48 hours prior to the first CHG shower.  You may shave your face/neck. Please follow these instructions carefully:  1.  Shower with CHG Soap the night before surgery and the  morning of Surgery.  2.  If you choose to wash your hair, wash your hair first as usual with your  normal  shampoo.  3.  After you shampoo, rinse your hair and body thoroughly to remove the  shampoo.                           4.  Use CHG as you would any other liquid soap.  You can apply chg directly  to the skin and wash                       Gently with a scrungie or clean  washcloth.  5.  Apply the CHG Soap to your body ONLY FROM THE NECK DOWN.   Do not use on face/ open                           Wound or open sores. Avoid contact with eyes, ears mouth and genitals (private parts).                       Wash face,  Genitals (private parts) with your normal soap.             6.  Wash thoroughly, paying special attention to the area where your surgery  will be performed.  7.  Thoroughly rinse your body with warm water from the neck down.  8.  DO NOT shower/wash with your normal soap after using and rinsing off  the CHG Soap.                9.  Pat yourself dry with a clean towel.            10.  Wear clean pajamas.            11.  Place clean sheets on your bed the night of your first shower and do not  sleep with pets. Day of Surgery : Do not apply any lotions/deodorants the morning of surgery.  Please wear clean clothes to the hospital/surgery center.  FAILURE TO FOLLOW THESE INSTRUCTIONS MAY RESULT IN THE CANCELLATION OF YOUR SURGERY PATIENT SIGNATURE_________________________________  NURSE SIGNATURE__________________________________  ________________________________________________________________________

## 2022-12-23 ENCOUNTER — Other Ambulatory Visit: Payer: Self-pay | Admitting: Family Medicine

## 2022-12-23 DIAGNOSIS — G629 Polyneuropathy, unspecified: Secondary | ICD-10-CM

## 2022-12-24 NOTE — Progress Notes (Signed)
Sent message, via epic in basket, requesting orders in epic from surgeon.  

## 2022-12-26 ENCOUNTER — Ambulatory Visit: Payer: Medicare Other | Admitting: Family Medicine

## 2022-12-26 ENCOUNTER — Telehealth: Payer: Self-pay | Admitting: Family Medicine

## 2022-12-26 NOTE — Telephone Encounter (Signed)
Pt called in saying she would be a no show for a 1:00 OV on 12/26/22 with Dr Doreene Burke. She thought it was at 2:00, I did send a letter.

## 2022-12-29 ENCOUNTER — Ambulatory Visit: Payer: Self-pay | Admitting: General Surgery

## 2022-12-29 NOTE — Telephone Encounter (Signed)
1st no show, letter sent via mail

## 2022-12-31 ENCOUNTER — Encounter (HOSPITAL_COMMUNITY)
Admission: RE | Admit: 2022-12-31 | Discharge: 2022-12-31 | Disposition: A | Discharge status: Home / Self Care | Payer: Medicare Other | Source: Ambulatory Visit | Attending: General Surgery | Admitting: General Surgery

## 2022-12-31 ENCOUNTER — Encounter (HOSPITAL_COMMUNITY): Payer: Self-pay

## 2022-12-31 ENCOUNTER — Other Ambulatory Visit: Payer: Self-pay

## 2022-12-31 VITALS — BP 117/82 | HR 71 | Temp 98.4°F | Resp 18 | Ht 65.5 in | Wt 151.1 lb

## 2022-12-31 DIAGNOSIS — Z01810 Encounter for preprocedural cardiovascular examination: Secondary | ICD-10-CM | POA: Diagnosis not present

## 2022-12-31 DIAGNOSIS — Z01818 Encounter for other preprocedural examination: Secondary | ICD-10-CM

## 2022-12-31 DIAGNOSIS — Z01812 Encounter for preprocedural laboratory examination: Secondary | ICD-10-CM | POA: Diagnosis not present

## 2022-12-31 LAB — CBC
HCT: 40.7 % (ref 36.0–46.0)
Hemoglobin: 13.5 g/dL (ref 12.0–15.0)
MCH: 31.3 pg (ref 26.0–34.0)
MCHC: 33.2 g/dL (ref 30.0–36.0)
MCV: 94.4 fL (ref 80.0–100.0)
Platelets: 181 10*3/uL (ref 150–400)
RBC: 4.31 MIL/uL (ref 3.87–5.11)
RDW: 12 % (ref 11.5–15.5)
WBC: 5.5 10*3/uL (ref 4.0–10.5)
nRBC: 0 % (ref 0.0–0.2)

## 2022-12-31 LAB — BASIC METABOLIC PANEL WITH GFR
Anion gap: 15 (ref 5–15)
BUN: 16 mg/dL (ref 8–23)
CO2: 23 mmol/L (ref 22–32)
Calcium: 9.2 mg/dL (ref 8.9–10.3)
Chloride: 102 mmol/L (ref 98–111)
Creatinine, Ser: 0.7 mg/dL (ref 0.44–1.00)
GFR, Estimated: >60 mL/min (ref >60)
Glucose, Bld: 89 mg/dL (ref 70–99)
Potassium: 4 mmol/L (ref 3.5–5.1)
Sodium: 140 mmol/L (ref 135–145)

## 2023-01-01 ENCOUNTER — Encounter: Payer: Self-pay | Admitting: Family Medicine

## 2023-01-01 ENCOUNTER — Ambulatory Visit (INDEPENDENT_AMBULATORY_CARE_PROVIDER_SITE_OTHER): Payer: Medicare Other | Admitting: Family Medicine

## 2023-01-01 VITALS — BP 100/68 | HR 77 | Temp 97.6°F | Ht 65.0 in | Wt 153.8 lb

## 2023-01-01 DIAGNOSIS — E7800 Pure hypercholesterolemia, unspecified: Secondary | ICD-10-CM | POA: Diagnosis not present

## 2023-01-01 DIAGNOSIS — R7303 Prediabetes: Secondary | ICD-10-CM | POA: Diagnosis not present

## 2023-01-01 DIAGNOSIS — Z23 Encounter for immunization: Secondary | ICD-10-CM

## 2023-01-01 DIAGNOSIS — I10 Essential (primary) hypertension: Secondary | ICD-10-CM

## 2023-01-01 MED ORDER — HYDROCHLOROTHIAZIDE 25 MG PO TABS
25.0000 mg | ORAL_TABLET | Freq: Every day | ORAL | 3 refills | Status: DC
Start: 2023-01-01 — End: 2023-12-18

## 2023-01-01 NOTE — Progress Notes (Signed)
Established Patient Office Visit   Subjective:  Patient ID: Michele Hanson, female    DOB: May 01, 1950  Age: 72 y.o. MRN: [id number]  Chief Complaint  Patient presents with   Medical Management of Chronic Issues    6 month follow up. Pt. Is not fasting.     HPI Encounter Diagnoses  Name Primary?   Prediabetes Yes   Essential hypertension    Elevated cholesterol    Need for immunization against influenza    For follow-up of above.  Femoral hernia surgery is scheduled for the 30th.  She has been on a weight loss clinic using semaglutide.  She has been able to lose some weight and her blood pressures come down.  Continues atorvastatin for elevated cholesterol.  Continue Zestoretic 20/25 for blood pressure.  Blood pressure has been running in the low 100s over 60s to 70s.   Review of Systems  Constitutional: Negative.   HENT: Negative.    Eyes:  Negative for blurred vision, discharge and redness.  Respiratory: Negative.    Cardiovascular: Negative.   Gastrointestinal:  Negative for abdominal pain.  Genitourinary: Negative.   Musculoskeletal: Negative.  Negative for myalgias.  Skin:  Negative for rash.  Neurological:  Negative for tingling, loss of consciousness and weakness.  Endo/Heme/Allergies:  Negative for polydipsia.     Current Outpatient Medications:    aspirin 325 MG tablet, Take 650-975 mg by mouth 2 (two) times daily., Disp: , Rfl:    atorvastatin (LIPITOR) 20 MG tablet, TAKE ONE TABLET BY MOUTH ONE TIME DAILY, Disp: 90 tablet, Rfl: 3   Brimonidine Tartrate (LUMIFY) 0.025 % SOLN, Place 1 drop into both eyes daily., Disp: , Rfl:    Calcium Carbonate-Simethicone (ALKA-SELTZER HEARTBURN + GAS PO), Take 2 tablets by mouth daily as needed (heartburn)., Disp: , Rfl:    cetirizine (ZYRTEC) 10 MG tablet, Take 10 mg by mouth daily as needed for allergies., Disp: , Rfl:    citalopram (CELEXA) 20 MG tablet, TAKE ONE TABLET BY MOUTH ONE TIME DAILY, Disp: 90 tablet, Rfl: 1    denosumab (PROLIA) 60 MG/ML SOSY injection, Inject 60 mg into the skin every 6 (six) months., Disp: , Rfl:    estradiol (ESTRACE) 0.1 MG/GM vaginal cream, Place 1 Applicatorful vaginally daily as needed (dryness)., Disp: , Rfl:    fluticasone (FLONASE) 50 MCG/ACT nasal spray, USE ONE SPRAY IN EACH NOSTRIL ONE TIME DAILY *SHAKE BEFORE USING* (Patient taking differently: Place 1 spray into both nostrils daily as needed for allergies.), Disp: 16 mL, Rfl: 4   gabapentin (NEURONTIN) 400 MG capsule, TAKE ONE CAPSULE BY MOUTH THREE TIMES A DAY, Disp: 90 capsule, Rfl: 2   hydrochlorothiazide (HYDRODIURIL) 25 MG tablet, Take 1 tablet (25 mg total) by mouth daily., Disp: 90 tablet, Rfl: 3   loperamide (IMODIUM A-D) 2 MG tablet, Take 2 mg by mouth 4 (four) times daily as needed for diarrhea or loose stools., Disp: , Rfl:    meloxicam (MOBIC) 15 MG tablet, May take 1 daily as needed., Disp: 90 tablet, Rfl: 0   methocarbamol (ROBAXIN) 500 MG tablet, TAKE ONE TABLET BY MOUTH EVERY 8 HOURS AS NEEDED FOR MUSCLE SPASM, Disp: 60 tablet, Rfl: 0   Milk Thistle 250 MG CAPS, Take 250 mg by mouth daily., Disp: , Rfl:    Multiple Vitamins-Minerals (ADULT GUMMY PO), Take 2 capsules by mouth daily., Disp: , Rfl:    omeprazole (PRILOSEC) 20 MG capsule, TAKE ONE CAPSULE BY MOUTH ONE TIME DAILY, Disp: 90 capsule,  Rfl: 1   SEMAGLUTIDE PO, Take 5 mg by mouth once a week., Disp: , Rfl:    Sennosides (EX-LAX PO), Take 1 tablet by mouth daily as needed (constipation)., Disp: , Rfl:    Vitamin D, Ergocalciferol, (DRISDOL) 1.25 MG (50000 UNIT) CAPS capsule, Take 50,000 Units by mouth every Thursday., Disp: , Rfl:    Objective:     BP 100/68   Pulse 77   Temp 97.6 F (36.4 C)   Ht 5\' 5"  (1.651 m)   Wt 153 lb 12.8 oz (69.8 kg)   SpO2 95%   BMI 25.59 kg/m  BP Readings from Last 3 Encounters:  01/01/23 100/68  12/31/22 117/82  11/25/22 109/71   Wt Readings from Last 3 Encounters:  01/01/23 153 lb 12.8 oz (69.8 kg)   12/31/22 151 lb 2 oz (68.5 kg)  11/25/22 151 lb 12.8 oz (68.9 kg)      Physical Exam Constitutional:      General: She is not in acute distress.    Appearance: Normal appearance. She is not ill-appearing, toxic-appearing or diaphoretic.  HENT:     Head: Normocephalic and atraumatic.     Right Ear: External ear normal.     Left Ear: External ear normal.     Mouth/Throat:     Mouth: Mucous membranes are moist.     Pharynx: Oropharynx is clear. No oropharyngeal exudate or posterior oropharyngeal erythema.  Eyes:     General: No scleral icterus.       Right eye: No discharge.        Left eye: No discharge.     Extraocular Movements: Extraocular movements intact.     Conjunctiva/sclera: Conjunctivae normal.  Cardiovascular:     Rate and Rhythm: Normal rate and regular rhythm.  Pulmonary:     Effort: Pulmonary effort is normal. No respiratory distress.     Breath sounds: Normal breath sounds. No wheezing or rhonchi.  Skin:    General: Skin is warm and dry.  Neurological:     Mental Status: She is alert and oriented to person, place, and time.  Psychiatric:        Mood and Affect: Mood normal.        Behavior: Behavior normal.      No results found for any visits on 01/01/23.    The 10-year ASCVD risk score (Arnett DK, et al., 2019) is: 8.1%    Assessment & Plan:   Prediabetes  Essential hypertension -     hydroCHLOROthiazide; Take 1 tablet (25 mg total) by mouth daily.  Dispense: 90 tablet; Refill: 3  Elevated cholesterol  Need for immunization against influenza -     Flu Vaccine Trivalent High Dose (Fluad)    Return in about 3 months (around 04/02/2023).  With her weight loss blood pressure is down.  Have discontinued Zestoretic.  Will continue HCTZ 25 mg daily.  Continue atorvastatin for elevated cholesterol.  Patient has been on semaglutide injectable for weight loss.  She may be discontinuing it.  Will check at next visit and also check A1c.  With her  weight loss, her prediabetes may have resolved.  Would like to check the A1c after she has been off of the semaglutide for a while.  Mliss Sax, MD

## 2023-01-21 ENCOUNTER — Encounter: Payer: Self-pay | Admitting: Family Medicine

## 2023-02-23 NOTE — Patient Instructions (Signed)
SURGICAL WAITING ROOM VISITATION  Patients having surgery or a procedure may have no more than 2 support people in the waiting area - these visitors may rotate.    Children under the age of 39 must have an adult with them who is not the patient.  Due to an increase in RSV and influenza rates and associated hospitalizations, children ages 67 and under may not visit patients in [facility] hospitals.  If the patient needs to stay at the hospital during part of their recovery, the visitor guidelines for inpatient rooms apply. Pre-op nurse will coordinate an appropriate time for 1 support person to accompany patient in pre-op.  This support person may not rotate.    Please refer to the [facility] website for the visitor guidelines for Inpatients (after your surgery is over and you are in a regular room).       Your procedure is scheduled on: 03/06/23   Report to [facility] Main Entrance    Report to admitting at  12:45 PM   Call this number if you have problems the morning of surgery [phone]   Do not eat food :After Midnight.   After Midnight you may have the following liquids until 12 noon  DAY OF SURGERY  Water Non-Citrus Juices (without pulp, NO RED-Apple, White grape, White cranberry) Black Coffee (NO MILK/CREAM OR CREAMERS, sugar ok)  Clear Tea (NO MILK/CREAM OR CREAMERS, sugar ok) regular and decaf                             Plain Jell-O (NO RED)                                           Fruit ices (not with fruit pulp, NO RED)                                     Popsicles (NO RED)                                                               Sports drinks like Gatorade (NO RED)                   Oral Hygiene is also important to reduce your risk of infection.                                    Remember - BRUSH YOUR TEETH THE MORNING OF SURGERY WITH YOUR REGULAR TOOTHPASTE  DENTURES WILL BE REMOVED PRIOR TO SURGERY PLEASE DO NOT APPLY "Poly grip" OR  ADHESIVES!!!   Stop all vitamins and herbal supplements 7 days before surgery.   Take these medicines the morning of surgery with A SIP OF WATER: Atorvastatin, Cetirizine, Citaprolam, Gabapentin, Omeprazole. Eye drops and nasal spray  DO NOT TAKE ANY ORAL DIABETIC MEDICATIONS DAY OF YOUR SURGERY. Hold Semaglutide for 7 days prior to surgery.             You  may not have any metal on your body including hair pins, jewelry, and body piercing             Do not wear make-up, lotions, powders, perfumes/cologne, or deodorant  Do not wear nail polish including gel and S&S, artificial/acrylic nails, or any other type of covering on natural nails including finger and toenails. If you have artificial nails, gel coating, etc. that needs to be removed by a nail salon please have this removed prior to surgery or surgery may need to be canceled/ delayed if the surgeon/ anesthesia feels like they are unable to be safely monitored.   Do not shave  48 hours prior to surgery.    Do not bring valuables to the hospital. [facility] IS NOT             RESPONSIBLE   FOR VALUABLES.   Contacts, glasses, dentures or bridgework may not be worn into surgery.   Bring small overnight bag day of surgery.   DO NOT BRING YOUR HOME MEDICATIONS TO THE HOSPITAL. PHARMACY WILL DISPENSE MEDICATIONS LISTED ON YOUR MEDICATION LIST TO YOU DURING YOUR ADMISSION IN THE HOSPITAL!    Patients discharged on the day of surgery will not be allowed to drive home.  Someone NEEDS to stay with you for the first 24 hours after anesthesia.   Special Instructions: Bring a copy of your healthcare power of attorney and living will documents the day of surgery if you haven't scanned them before.              Please read over the following fact sheets you were given: IF YOU HAVE QUESTIONS ABOUT YOUR PRE-OP INSTRUCTIONS PLEASE CALL [phone]   If you received a COVID test during your pre-op visit  it is requested that you wear a mask  when out in public, stay away from anyone that may not be feeling well and notify your surgeon if you develop symptoms. If you test positive for Covid or have been in contact with anyone that has tested positive in the last 10 days please notify you surgeon.    [place] - Preparing for Surgery Before surgery, you can play an important role.  Because skin is not sterile, your skin needs to be as free of germs as possible.  You can reduce the number of germs on your skin by washing with CHG (chlorahexidine gluconate) soap before surgery.  CHG is an antiseptic cleaner which kills germs and bonds with the skin to continue killing germs even after washing. Please DO NOT use if you have an allergy to CHG or antibacterial soaps.  If your skin becomes reddened/irritated stop using the CHG and inform your nurse when you arrive at Short Stay. Do not shave (including legs and underarms) for at least 48 hours prior to the first CHG shower.  You may shave your face/neck.  Please follow these instructions carefully:  1.  Shower with CHG Soap the night before surgery and the  morning of surgery.  2.  If you choose to wash your hair, wash your hair first as usual with your normal  shampoo.  3.  After you shampoo, rinse your hair and body thoroughly to remove the shampoo.                             4.  Use CHG as you would any other liquid soap.  You can apply chg directly to the  skin and wash.  Gently with a scrungie or clean washcloth.  5.  Apply the CHG Soap to your body ONLY FROM THE NECK DOWN.   Do   not use on face/ open                           Wound or open sores. Avoid contact with eyes, ears mouth and   genitals (private parts).                       Wash face,  Genitals (private parts) with your normal soap.             6.  Wash thoroughly, paying special attention to the area where your    surgery  will be performed.  7.  Thoroughly rinse your body with warm water from the neck down.  8.  DO NOT  shower/wash with your normal soap after using and rinsing off the CHG Soap.                9.  Pat yourself dry with a clean towel.            10.  Wear clean pajamas.            11.  Place clean sheets on your bed the night of your first shower and do not  sleep with pets. Day of Surgery : Do not apply any lotions/deodorants the morning of surgery.  Please wear clean clothes to the hospital/surgery center.  FAILURE TO FOLLOW THESE INSTRUCTIONS MAY RESULT IN THE CANCELLATION OF YOUR SURGERY  PATIENT SIGNATURE_________________________________  NURSE SIGNATURE__________________________________  ________________________________________________________________________

## 2023-02-23 NOTE — Progress Notes (Signed)
COVID Vaccine received:  []  No [x]  Yes Date of any COVID positive Test in last 90 days: no PCP - Nadene Rubins MD Cardiologist - no  Chest x-ray -  EKG -  12/31/22 Epic Stress Test -  ECHO -  Cardiac Cath -   Bowel Prep - [x]  No  []   Yes ______  Pacemaker / ICD device [x]  No []  Yes   Spinal Cord Stimulator:[x]  No []  Yes       History of Sleep Apnea? [x]  No []  Yes   CPAP used?- [x]  No []  Yes    Does the patient monitor blood sugar?          [x]  No []  Yes  []  N/A  Patient has: [x]  NO Hx DM   []  Pre-DM                 []  DM1  []   DM2 Does patient have a [employer] or [employer]? []  No []  Yes   Fasting Blood Sugar Ranges-  Checks Blood Sugar _____ times a day  GLP1 agonist / usual dose - Semaglutide- Last dose 02/18/23 GLP1 instructions:  SGLT-2 inhibitors / usual dose -  SGLT-2 instructions:   Blood Thinner / Instructions:no Aspirin Instructions:325mg  daily- MD said pt. Could continue to take per pt.  Comments:   Activity level: Patient is able  to climb a flight of stairs without difficulty; [x]  No CP  [x]  No SOB,     Patient can  perform ADLs without assistance.   Anesthesia review:   Patient denies shortness of breath, fever, cough and chest pain at PAT appointment.  Patient verbalized understanding and agreement to the Pre-Surgical Instructions that were given to them at this PAT appointment. Patient was also educated of the need to review these PAT instructions again prior to his/her surgery.I reviewed the appropriate phone numbers to call if they have any and questions or concerns.

## 2023-02-25 ENCOUNTER — Encounter (HOSPITAL_COMMUNITY): Payer: Self-pay

## 2023-02-25 ENCOUNTER — Encounter (HOSPITAL_COMMUNITY)
Admission: RE | Admit: 2023-02-25 | Discharge: 2023-02-25 | Disposition: A | Discharge status: Home / Self Care | Payer: Medicare Other | Source: Ambulatory Visit | Attending: General Surgery | Admitting: General Surgery

## 2023-02-25 VITALS — BP 140/86 | HR 67 | Temp 98.2°F | Resp 16 | Ht 65.5 in | Wt 155.0 lb

## 2023-02-25 DIAGNOSIS — I10 Essential (primary) hypertension: Secondary | ICD-10-CM | POA: Insufficient documentation

## 2023-02-25 DIAGNOSIS — Z01812 Encounter for preprocedural laboratory examination: Secondary | ICD-10-CM | POA: Insufficient documentation

## 2023-02-25 HISTORY — DX: Myoneural disorder, unspecified: G70.9

## 2023-02-25 HISTORY — DX: Headache, unspecified: R51.9

## 2023-02-25 LAB — BASIC METABOLIC PANEL
Anion gap: 12 (ref 5–15)
BUN: 18 mg/dL (ref 8–23)
CO2: 26 mmol/L (ref 22–32)
Calcium: 9.3 mg/dL (ref 8.9–10.3)
Chloride: 100 mmol/L (ref 98–111)
Creatinine, Ser: 0.79 mg/dL (ref 0.44–1.00)
GFR, Estimated: >60 mL/min (ref >60)
Glucose, Bld: 95 mg/dL (ref 70–99)
Potassium: 4.1 mmol/L (ref 3.5–5.1)
Sodium: 138 mmol/L (ref 135–145)

## 2023-02-25 LAB — CBC
HCT: 41.3 % (ref 36.0–46.0)
Hemoglobin: 13.9 g/dL (ref 12.0–15.0)
MCH: 32.2 pg (ref 26.0–34.0)
MCHC: 33.7 g/dL (ref 30.0–36.0)
MCV: 95.6 fL (ref 80.0–100.0)
Platelets: 167 10*3/uL (ref 150–400)
RBC: 4.32 MIL/uL (ref 3.87–5.11)
RDW: 11.9 % (ref 11.5–15.5)
WBC: 5.9 10*3/uL (ref 4.0–10.5)
nRBC: 0 % (ref 0.0–0.2)

## 2023-02-27 ENCOUNTER — Other Ambulatory Visit: Payer: Self-pay | Admitting: Family Medicine

## 2023-02-27 ENCOUNTER — Other Ambulatory Visit: Payer: Self-pay

## 2023-02-27 DIAGNOSIS — E7800 Pure hypercholesterolemia, unspecified: Secondary | ICD-10-CM

## 2023-02-27 MED ORDER — ATORVASTATIN CALCIUM 20 MG PO TABS
20.0000 mg | ORAL_TABLET | Freq: Every day | ORAL | 3 refills | Status: DC
Start: 2023-02-27 — End: 2023-12-18

## 2023-03-05 IMAGING — CR DG SHOULDER 2+V PORT*R*
1 series · 1 of 1 positions shown · non-contrast
Comparison: 09/19/2020 CT

CLINICAL DATA: Right shoulder arthroplasty

EXAM:
PORTABLE RIGHT SHOULDER

[shoulder ap]
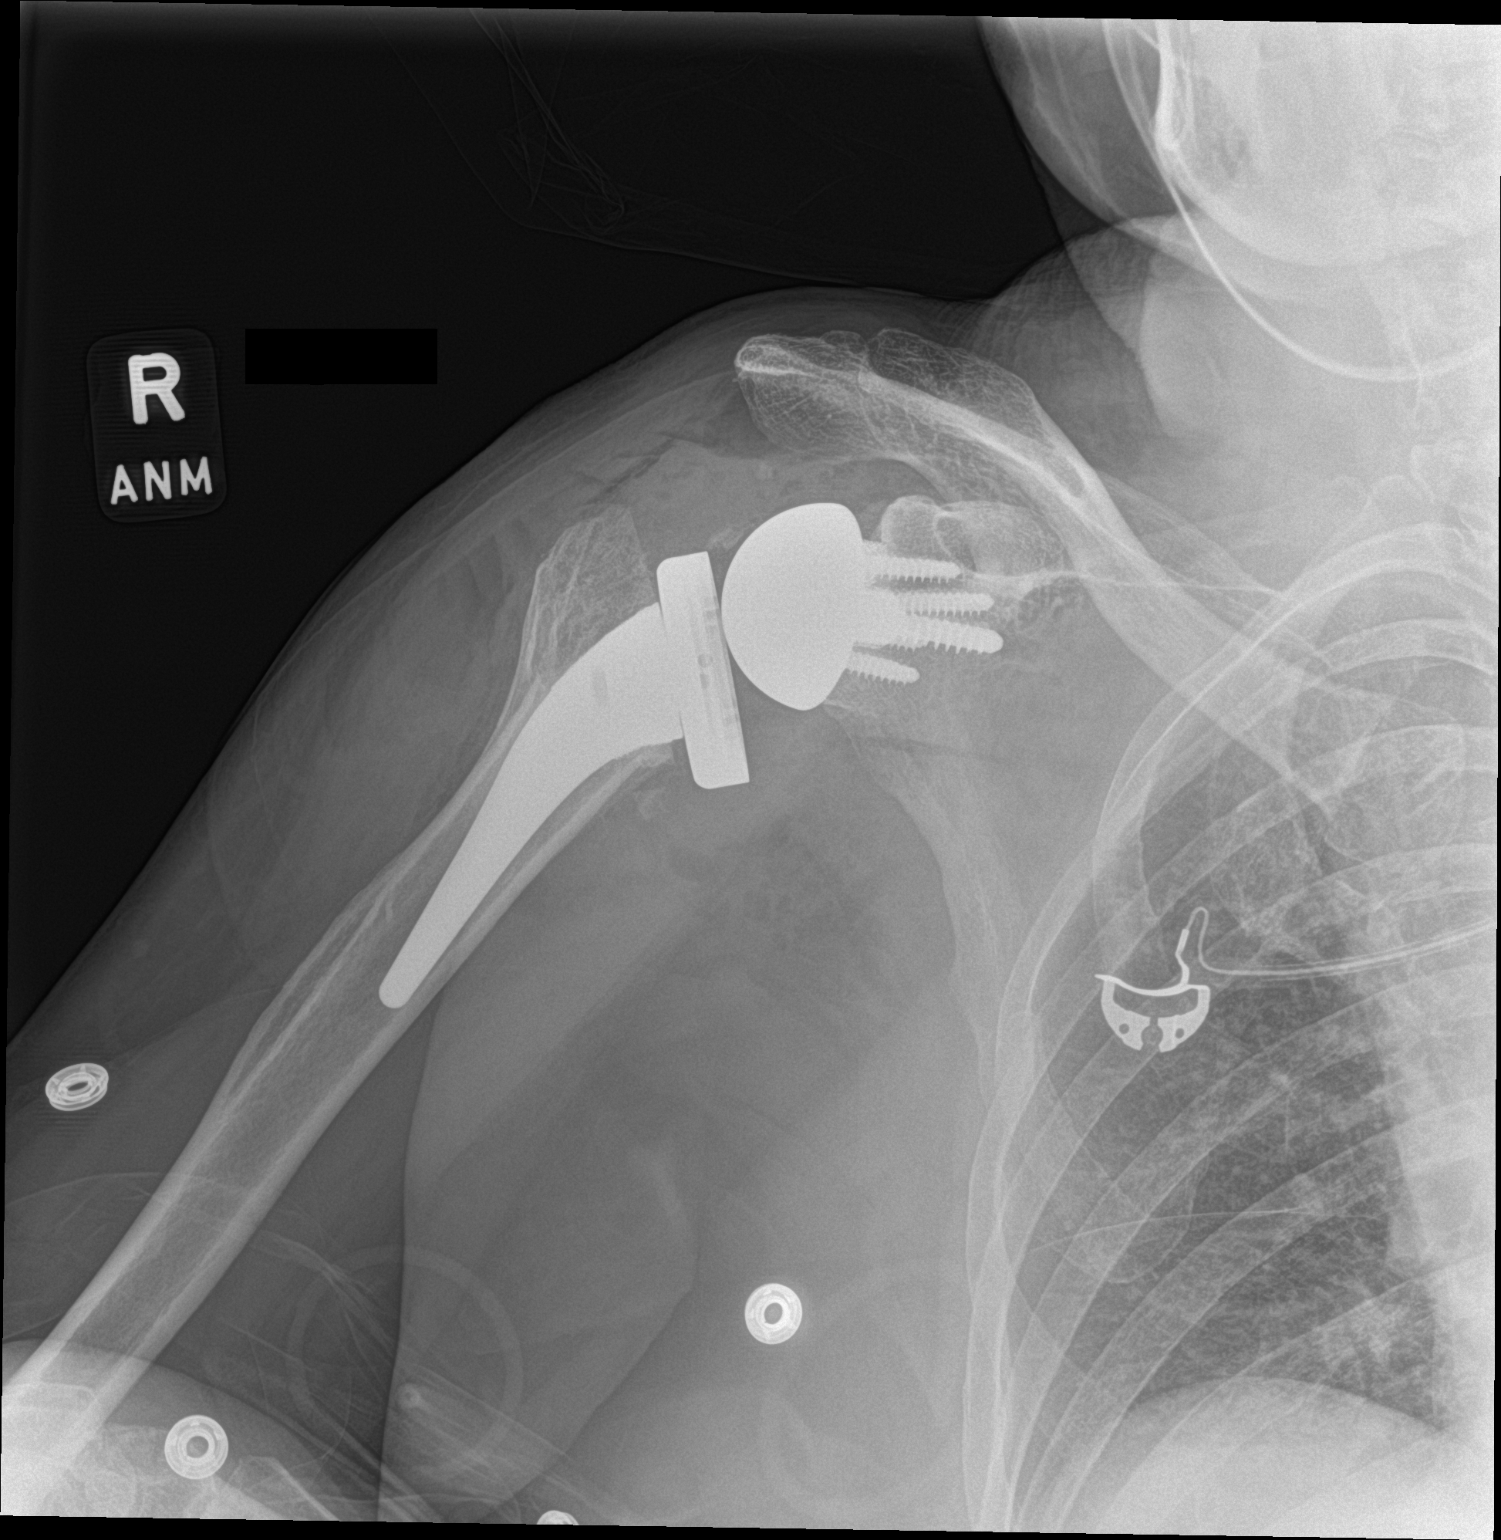

[1 of 1 positions shown; findings below may reference images not displayed]

FINDINGS: Reverse glenohumeral arthroplasty which is located and intact in the
frontal projection. No visible periprosthetic fracture.
IMPRESSION: No acute finding related to the glenohumeral arthroplasty.

## 2023-03-06 ENCOUNTER — Encounter (HOSPITAL_COMMUNITY): Payer: Self-pay | Admitting: General Surgery

## 2023-03-06 ENCOUNTER — Ambulatory Visit (HOSPITAL_COMMUNITY): Payer: Medicare Other | Admitting: Anesthesiology

## 2023-03-06 ENCOUNTER — Other Ambulatory Visit: Payer: Self-pay

## 2023-03-06 ENCOUNTER — Encounter (HOSPITAL_COMMUNITY)
Admission: RE | Disposition: A | Discharge status: Home / Self Care | Payer: Self-pay | Source: Ambulatory Visit | Attending: General Surgery

## 2023-03-06 ENCOUNTER — Ambulatory Visit (HOSPITAL_COMMUNITY)
Admission: RE | Admit: 2023-03-06 | Discharge: 2023-03-06 | Disposition: A | Discharge status: Home / Self Care | Payer: Medicare Other | Source: Ambulatory Visit | Attending: General Surgery | Admitting: General Surgery

## 2023-03-06 ENCOUNTER — Ambulatory Visit (HOSPITAL_COMMUNITY): Payer: Medicare Other | Admitting: Medical

## 2023-03-06 DIAGNOSIS — K4090 Unilateral inguinal hernia, without obstruction or gangrene, not specified as recurrent: Secondary | ICD-10-CM | POA: Diagnosis present

## 2023-03-06 DIAGNOSIS — M1990 Unspecified osteoarthritis, unspecified site: Secondary | ICD-10-CM | POA: Diagnosis not present

## 2023-03-06 DIAGNOSIS — K660 Peritoneal adhesions (postprocedural) (postinfection): Secondary | ICD-10-CM | POA: Diagnosis not present

## 2023-03-06 DIAGNOSIS — I10 Essential (primary) hypertension: Secondary | ICD-10-CM | POA: Diagnosis not present

## 2023-03-06 DIAGNOSIS — R7303 Prediabetes: Secondary | ICD-10-CM | POA: Insufficient documentation

## 2023-03-06 DIAGNOSIS — Z87891 Personal history of nicotine dependence: Secondary | ICD-10-CM | POA: Insufficient documentation

## 2023-03-06 DIAGNOSIS — Z01818 Encounter for other preprocedural examination: Secondary | ICD-10-CM

## 2023-03-06 HISTORY — PX: FEMORAL HERNIA REPAIR: SHX632

## 2023-03-06 SURGERY — HERNIA REPAIR FEMORAL
Anesthesia: General | Laterality: Left

## 2023-03-06 MED ORDER — LIDOCAINE HCL (CARDIAC) PF 100 MG/5ML IV SOSY
PREFILLED_SYRINGE | INTRAVENOUS | Status: DC | PRN
  Administered 2023-03-06: 50 mg via INTRAVENOUS

## 2023-03-06 MED ORDER — ROCURONIUM BROMIDE 10 MG/ML (PF) SYRINGE
PREFILLED_SYRINGE | INTRAVENOUS | Status: AC
  Filled 2023-03-06: qty 10

## 2023-03-06 MED ORDER — FENTANYL CITRATE (PF) 100 MCG/2ML IJ SOLN
INTRAMUSCULAR | Status: DC | PRN
  Administered 2023-03-06 (×2): 25 ug via INTRAVENOUS
  Administered 2023-03-06: 50 ug via INTRAVENOUS
  Administered 2023-03-06 (×2): 25 ug via INTRAVENOUS
  Administered 2023-03-06 (×2): 50 ug via INTRAVENOUS

## 2023-03-06 MED ORDER — MIDAZOLAM HCL 2 MG/2ML IJ SOLN
INTRAMUSCULAR | Status: AC
  Filled 2023-03-06: qty 2

## 2023-03-06 MED ORDER — ORAL CARE MOUTH RINSE: 15.0000 mL | Freq: Once | OROMUCOSAL | Status: DC

## 2023-03-06 MED ORDER — BUPIVACAINE LIPOSOME 1.3 % IJ SUSP
INTRAMUSCULAR | Status: DC | PRN
  Administered 2023-03-06: 20 mL

## 2023-03-06 MED ORDER — LIDOCAINE HCL (PF) 2 % IJ SOLN
INTRAMUSCULAR | Status: AC
  Filled 2023-03-06: qty 5

## 2023-03-06 MED ORDER — OXYCODONE HCL 5 MG/5ML PO SOLN: 5.0000 mg | Freq: Once | ORAL | Status: AC | PRN

## 2023-03-06 MED ORDER — FENTANYL CITRATE PF 50 MCG/ML IJ SOSY: 25.0000 ug | PREFILLED_SYRINGE | INTRAMUSCULAR | Status: DC | PRN

## 2023-03-06 MED ORDER — ROCURONIUM BROMIDE 100 MG/10ML IV SOLN
INTRAVENOUS | Status: DC | PRN
  Administered 2023-03-06: 60 mg via INTRAVENOUS
  Administered 2023-03-06: 10 mg via INTRAVENOUS

## 2023-03-06 MED ORDER — PROPOFOL 10 MG/ML IV BOLUS
INTRAVENOUS | Status: DC | PRN
  Administered 2023-03-06: 150 mg via INTRAVENOUS

## 2023-03-06 MED ORDER — OXYCODONE HCL 5 MG PO TABS
5.0000 mg | ORAL_TABLET | Freq: Once | ORAL | Status: AC | PRN
  Administered 2023-03-06: 5 mg via ORAL

## 2023-03-06 MED ORDER — ACETAMINOPHEN 500 MG PO TABS
1000.0000 mg | ORAL_TABLET | Freq: Once | ORAL | Status: AC
  Administered 2023-03-06: 1000 mg via ORAL
  Filled 2023-03-06: qty 2

## 2023-03-06 MED ORDER — BUPIVACAINE LIPOSOME 1.3 % IJ SUSP
INTRAMUSCULAR | Status: AC
  Filled 2023-03-06: qty 20

## 2023-03-06 MED ORDER — ONDANSETRON HCL 4 MG/2ML IJ SOLN
INTRAMUSCULAR | Status: AC
  Filled 2023-03-06: qty 2

## 2023-03-06 MED ORDER — DEXAMETHASONE SODIUM PHOSPHATE 10 MG/ML IJ SOLN
INTRAMUSCULAR | Status: AC
  Filled 2023-03-06: qty 1

## 2023-03-06 MED ORDER — EPHEDRINE SULFATE (PRESSORS) 50 MG/ML IJ SOLN
INTRAMUSCULAR | Status: DC | PRN
Start: 2023-03-06 — End: 2023-03-06
  Administered 2023-03-06: 10 mg via INTRAVENOUS

## 2023-03-06 MED ORDER — KETOROLAC TROMETHAMINE 15 MG/ML IJ SOLN
INTRAMUSCULAR | Status: DC | PRN
  Administered 2023-03-06: 15 mg via INTRAVENOUS

## 2023-03-06 MED ORDER — CHLORHEXIDINE GLUCONATE CLOTH 2 % EX PADS: 6.0000 | MEDICATED_PAD | Freq: Once | CUTANEOUS | Status: DC

## 2023-03-06 MED ORDER — DEXAMETHASONE SODIUM PHOSPHATE 10 MG/ML IJ SOLN
INTRAMUSCULAR | Status: DC | PRN
  Administered 2023-03-06: 4 mg via INTRAVENOUS

## 2023-03-06 MED ORDER — ONDANSETRON HCL 4 MG/2ML IJ SOLN
INTRAMUSCULAR | Status: DC | PRN
  Administered 2023-03-06: 4 mg via INTRAVENOUS

## 2023-03-06 MED ORDER — OXYCODONE HCL 5 MG PO TABS: 5.0000 mg | ORAL_TABLET | Freq: Four times a day (QID) | ORAL | 0 refills | Status: DC | PRN

## 2023-03-06 MED ORDER — ACETAMINOPHEN 500 MG PO TABS: 1000.0000 mg | ORAL_TABLET | ORAL | Status: DC

## 2023-03-06 MED ORDER — ACETAMINOPHEN 500 MG PO TABS: 1000.0000 mg | ORAL_TABLET | Freq: Three times a day (TID) | ORAL | Status: AC

## 2023-03-06 MED ORDER — ESMOLOL HCL 100 MG/10ML IV SOLN
INTRAVENOUS | Status: DC | PRN
  Administered 2023-03-06: 20 mg via INTRAVENOUS

## 2023-03-06 MED ORDER — KETOROLAC TROMETHAMINE 30 MG/ML IJ SOLN
INTRAMUSCULAR | Status: AC
  Filled 2023-03-06: qty 1

## 2023-03-06 MED ORDER — ONDANSETRON HCL 4 MG/2ML IJ SOLN: 4.0000 mg | Freq: Once | INTRAMUSCULAR | Status: DC | PRN

## 2023-03-06 MED ORDER — OXYCODONE HCL 5 MG PO TABS
ORAL_TABLET | ORAL | Status: AC
  Filled 2023-03-06: qty 1

## 2023-03-06 MED ORDER — BUPIVACAINE LIPOSOME 1.3 % IJ SUSP: 20.0000 mL | Freq: Once | INTRAMUSCULAR | Status: DC

## 2023-03-06 MED ORDER — BUPIVACAINE-EPINEPHRINE 0.25% -1:200000 IJ SOLN
INTRAMUSCULAR | Status: AC
  Filled 2023-03-06: qty 1

## 2023-03-06 MED ORDER — MIDAZOLAM HCL 5 MG/5ML IJ SOLN
INTRAMUSCULAR | Status: DC | PRN
  Administered 2023-03-06 (×2): 1 mg via INTRAVENOUS

## 2023-03-06 MED ORDER — FENTANYL CITRATE (PF) 250 MCG/5ML IJ SOLN
INTRAMUSCULAR | Status: AC
  Filled 2023-03-06: qty 5

## 2023-03-06 MED ORDER — CEFAZOLIN SODIUM-DEXTROSE 2-4 GM/100ML-% IV SOLN
2.0000 g | INTRAVENOUS | Status: AC
  Administered 2023-03-06: 2 g via INTRAVENOUS
  Filled 2023-03-06: qty 100

## 2023-03-06 MED ORDER — SUGAMMADEX SODIUM 200 MG/2ML IV SOLN
INTRAVENOUS | Status: DC | PRN
  Administered 2023-03-06: 200 mg via INTRAVENOUS

## 2023-03-06 MED ORDER — LIDOCAINE HCL (PF) 2 % IJ SOLN
INTRAMUSCULAR | Status: AC
Start: 2023-03-06 — End: ?
  Filled 2023-03-06: qty 5

## 2023-03-06 MED ORDER — LACTATED RINGERS IV SOLN
INTRAVENOUS | Status: DC
Start: 2023-03-06 — End: 2023-03-06

## 2023-03-06 MED ORDER — DEXMEDETOMIDINE HCL IN NACL 80 MCG/20ML IV SOLN
INTRAVENOUS | Status: DC | PRN
  Administered 2023-03-06: 8 ug via INTRAVENOUS

## 2023-03-06 MED ORDER — BUPIVACAINE-EPINEPHRINE (PF) 0.25% -1:200000 IJ SOLN
INTRAMUSCULAR | Status: DC | PRN
  Administered 2023-03-06: 30 mL via PERINEURAL

## 2023-03-06 MED ORDER — ORAL CARE MOUTH RINSE: 15.0000 mL | Freq: Once | OROMUCOSAL | Status: AC

## 2023-03-06 MED ORDER — EPHEDRINE 5 MG/ML INJ
INTRAVENOUS | Status: AC
  Filled 2023-03-06: qty 5

## 2023-03-06 MED ORDER — CHLORHEXIDINE GLUCONATE 0.12 % MT SOLN: 15.0000 mL | Freq: Once | OROMUCOSAL | Status: DC

## 2023-03-06 MED ORDER — CHLORHEXIDINE GLUCONATE 0.12 % MT SOLN
15.0000 mL | Freq: Once | OROMUCOSAL | Status: AC
Start: 2023-03-06 — End: 2023-03-06
  Administered 2023-03-06: 15 mL via OROMUCOSAL

## 2023-03-06 MED ORDER — PROPOFOL 10 MG/ML IV BOLUS
INTRAVENOUS | Status: AC
  Filled 2023-03-06: qty 20

## 2023-03-06 SURGICAL SUPPLY — 54 items
ANTIFOG SOL W/FOAM PAD STRL (MISCELLANEOUS) ×1
BAG COUNTER SPONGE SURGICOUNT (BAG) IMPLANT
BLADE SURG SZ11 CARB STEEL (BLADE) ×1 IMPLANT
CANNULA REDUCER 12-8 DVNC XI (CANNULA) IMPLANT
CHLORAPREP W/TINT 26 (MISCELLANEOUS) ×1 IMPLANT
COVER MAYO STAND STRL (DRAPES) ×1 IMPLANT
COVER SURGICAL LIGHT HANDLE (MISCELLANEOUS) ×1 IMPLANT
COVER TIP SHEARS 8 DVNC (MISCELLANEOUS) ×1 IMPLANT
DRAPE ARM DVNC X/XI (DISPOSABLE) ×3 IMPLANT
DRAPE COLUMN DVNC XI (DISPOSABLE) ×1 IMPLANT
DRIVER NDL LRG 8 DVNC XI (INSTRUMENTS) ×1 IMPLANT
DRIVER NDL MEGA SUTCUT DVNCXI (INSTRUMENTS) ×2 IMPLANT
DRIVER NDLE LRG 8 DVNC XI (INSTRUMENTS) ×1 IMPLANT
DRIVER NDLE MEGA SUTCUT DVNCXI (INSTRUMENTS) ×2 IMPLANT
DRSG TEGADERM 2-3/8X2-3/4 SM (GAUZE/BANDAGES/DRESSINGS) ×3 IMPLANT
ELECT REM PT RETURN 15FT ADLT (MISCELLANEOUS) ×1 IMPLANT
GAUZE 4X4 16PLY ~~LOC~~+RFID DBL (SPONGE) IMPLANT
GAUZE SPONGE 2X2 8PLY STRL LF (GAUZE/BANDAGES/DRESSINGS) ×1 IMPLANT
GLOVE BIO SURGEON STRL SZ7.5 (GLOVE) ×2 IMPLANT
GLOVE INDICATOR 8.0 STRL GRN (GLOVE) ×2 IMPLANT
GOWN STRL REUS W/ TWL XL LVL3 (GOWN DISPOSABLE) ×2 IMPLANT
GRASPER SUT TROCAR 14GX15 (MISCELLANEOUS) IMPLANT
GRASPER TIP-UP FEN DVNC XI (INSTRUMENTS) ×1 IMPLANT
IRRIG SUCT STRYKERFLOW 2 WTIP (MISCELLANEOUS)
IRRIGATION SUCT STRKRFLW 2 WTP (MISCELLANEOUS) IMPLANT
KIT BASIN OR (CUSTOM PROCEDURE TRAY) ×1 IMPLANT
KIT TURNOVER KIT A (KITS) IMPLANT
MARKER SKIN DUAL TIP RULER LAB (MISCELLANEOUS) ×1 IMPLANT
MESH 3DMAX MID 4X6 LT LRG (Mesh General) IMPLANT
NDL HYPO 22X1.5 SAFETY MO (MISCELLANEOUS) ×1 IMPLANT
NEEDLE HYPO 22X1.5 SAFETY MO (MISCELLANEOUS) ×1 IMPLANT
OBTURATOR OPTICAL STND 8 DVNC (TROCAR) ×1
OBTURATOR OPTICALSTD 8 DVNC (TROCAR) ×1 IMPLANT
PACK CARDIOVASCULAR III (CUSTOM PROCEDURE TRAY) ×1 IMPLANT
PAD POSITIONING PINK XL (MISCELLANEOUS) ×1 IMPLANT
SCISSORS LAP 5X35 DISP (ENDOMECHANICALS) IMPLANT
SCISSORS MNPLR CVD DVNC XI (INSTRUMENTS) ×1 IMPLANT
SEAL UNIV 5-12 XI (MISCELLANEOUS) ×3 IMPLANT
SOL ELECTROSURG ANTI STICK (MISCELLANEOUS) ×1
SOLUTION ANTFG W/FOAM PAD STRL (MISCELLANEOUS) ×1 IMPLANT
SOLUTION ELECTROSURG ANTI STCK (MISCELLANEOUS) ×1 IMPLANT
SPIKE FLUID TRANSFER (MISCELLANEOUS) ×1 IMPLANT
STRIP CLOSURE SKIN 1/2X4 (GAUZE/BANDAGES/DRESSINGS) ×1 IMPLANT
SUT MNCRL AB 4-0 PS2 18 (SUTURE) ×1 IMPLANT
SUT VIC AB 2-0 SH 27X BRD (SUTURE) IMPLANT
SUT VIC AB 3-0 SH 27XBRD (SUTURE) ×2 IMPLANT
SUT VICRYL 0 UR6 27IN ABS (SUTURE) IMPLANT
SUT VLOC 180 3-0 9IN GS21 (SUTURE) ×1 IMPLANT
SYR 20ML LL LF (SYRINGE) ×1 IMPLANT
TAPE STRIPS DRAPE STRL (GAUZE/BANDAGES/DRESSINGS) ×1 IMPLANT
TOWEL OR 17X26 10 PK STRL BLUE (TOWEL DISPOSABLE) ×1 IMPLANT
TOWEL OR NON WOVEN STRL DISP B (DISPOSABLE) ×1 IMPLANT
TROCAR XCEL NON-BLD 5MMX100MML (ENDOMECHANICALS) ×1 IMPLANT
TUBING INSUFFLATION 10FT LAP (TUBING) ×1 IMPLANT

## 2023-03-06 NOTE — Transfer of Care (Signed)
Immediate Anesthesia Transfer of Care Note  Patient: Michele Hanson  Procedure(s) Performed: ROBOTIC REPAIR LEFT INGUINAL HERNIA WITH MESH (Left)  Patient Location: PACU  Anesthesia Type:General  Level of Consciousness: awake, alert , oriented, and patient cooperative  Airway & Oxygen Therapy: Patient Spontanous Breathing and Patient connected to face mask oxygen  Post-op Assessment: Report given to RN and Post -op Vital signs reviewed and stable  Post vital signs: Reviewed and stable  Last Vitals:  Vitals Value Taken Time  BP 129/85 03/06/23 1240  Temp    Pulse 78 03/06/23 1242  Resp 22 03/06/23 1242  SpO2 99 % 03/06/23 1242  Vitals shown include unfiled device data.  Last Pain:  Vitals:   03/06/23 0841  TempSrc:   PainSc: 5          Complications: No notable events documented.

## 2023-03-06 NOTE — Anesthesia Procedure Notes (Signed)
Procedure Name: Intubation Date/Time: 03/06/2023 10:16 AM  Performed by: Garth Bigness, CRNAPre-anesthesia Checklist: Patient identified, Emergency Drugs available, Suction available and Patient being monitored Patient Re-evaluated:Patient Re-evaluated prior to induction Oxygen Delivery Method: Circle system utilized Preoxygenation: Pre-oxygenation with 100% oxygen Induction Type: IV induction Ventilation: Mask ventilation without difficulty Laryngoscope Size: Mac and 3 Grade View: Grade I Tube type: Oral Tube size: 7.0 mm Number of attempts: 1 Airway Equipment and Method: Stylet and Oral airway Placement Confirmation: ETT inserted through vocal cords under direct vision, positive ETCO2 and breath sounds checked- equal and bilateral Secured at: 21 cm Tube secured with: Tape Dental Injury: Teeth and Oropharynx as per pre-operative assessment

## 2023-03-06 NOTE — H&P (Signed)
CC: here for hernia surgery  Requesting provider: n/a  HPI: Michele Hanson is an 72 y.o. female who is here for robotic repair of left femoral hernia. Last seen in clinic in July. Denies changes since seen in clinic.   Old hpi: Michele Hanson is a 72 y.o. female who is seen today as an office consultation at the request of Dr. Doreene Burke for evaluation of New Consultation (FEMORAL HERNIA) .  She states that she has noticed a bulge in her left groin for at least a month. She saw her PCP who also noticed the bulge and got an ultrasound which showed a probable left femoral hernia. She states that the bulge is generally always there but it does change in size at times. She does notice some discomfort in the area. She states for over a year she has had some throbbing pains in her left groin but states that she thought it was due to a cyst on her ovaries. She has a history of open cholecystectomy. She had ectopic surgery in the 70s through a Pfannenstiel incision. She then had some fertility surgeries also through the Pfannenstiel incision. She also reports bladder surgery. No chest pain, chest pressure, shortness of breath, dyspnea on exertion. No tobacco use   Past Medical History:  Diagnosis Date   Anxiety    Arthritis    Depression    Fatty liver disease, nonalcoholic    GERD (gastroesophageal reflux disease)    Headache    Hepatitis 1973   hep c-was treated   Hyperlipemia    Hypertension    Neuromuscular disorder (HCC)     Past Surgical History:  Procedure Laterality Date   BACK SURGERY     CARPAL TUNNEL RELEASE Bilateral    CHOLECYSTECTOMY     DIAGNOSTIC LAPAROSCOPY     INCONTINENCE SURGERY     LAPAROSCOPIC LYSIS OF ADHESIONS     REVERSE SHOULDER ARTHROPLASTY Right 03/20/2021   Procedure: REVERSE SHOULDER ARTHROPLASTY;  Surgeon: Bjorn Pippin, MD;  Location:  SURGERY CENTER;  Service: Orthopedics;  Laterality: Right;    Family History  Family history unknown: Yes     Social:  reports that she has quit smoking. Her smoking use included cigarettes. She has never used smokeless tobacco. She reports current alcohol use. She reports that she does not use drugs.  Allergies:  Allergies  Allergen Reactions   Ciprofloxacin Other (See Comments)    Neuropathy in feet   Neomycin Itching    Eye drops   Nickel Itching and Rash    Medications: I have reviewed the patient's current medications.   ROS - all of the below systems have been reviewed with the patient and positives are indicated with bold text General: chills, fever or night sweats Eyes: blurry vision or double vision ENT: epistaxis or sore throat Allergy/Immunology: itchy/watery eyes or nasal congestion Hematologic/Lymphatic: bleeding problems, blood clots or swollen lymph nodes Endocrine: temperature intolerance or unexpected weight changes Breast: new or changing breast lumps or nipple discharge Resp: cough, shortness of breath, or wheezing CV: chest pain or dyspnea on exertion GI: as per HPI GU: dysuria, trouble voiding, or hematuria MSK: joint pain or joint stiffness Neuro: TIA or stroke symptoms Derm: pruritus and skin lesion changes Psych: anxiety and depression  PE Blood pressure 127/78, pulse 66, temperature 97.6 F (36.4 C), temperature source Oral, resp. rate 15, SpO2 99%. Constitutional: NAD; conversant; no deformities Eyes: Moist conjunctiva; no lid lag; anicteric; PERRL Neck: Trachea midline; no thyromegaly Lungs: Normal  respiratory effort; no tactile fremitus CV: RRR; no palpable thrills; no pitting edema GI: Abd soft, nt nd  old rt paramedian incision, old low transverse incision; no palpable hepatosplenomegaly MSK: Normal gait; no clubbing/cyanosis Psychiatric: Appropriate affect; alert and oriented x3 Lymphatic: No palpable cervical or axillary lymphadenopathy Skin:no rash/lesions  No results found for this or any previous visit (from the past 48 hour(s)).  No  results found.  Imaging: reviewed  A/P: Michele Hanson is an 72 y.o. female with left femoral hernia To OR for repair Iv abx Eras proctol All questions asked and answered.    Previously discussed potential risk to convert to open given history of prior pelvic surgery  Mary Sella. Andrey Campanile, MD, FACS General, Bariatric, & Minimally Invasive Surgery [facility]

## 2023-03-06 NOTE — Anesthesia Preprocedure Evaluation (Addendum)
Anesthesia Evaluation  Patient identified by MRN, date of birth, ID band Patient awake    Reviewed: Allergy & Precautions, NPO status , Patient's Chart, lab work & pertinent test results  History of Anesthesia Complications Negative for: history of anesthetic complications  Airway Mallampati: II  TM Distance: >3 FB Neck ROM: Full    Dental  (+) Dental Advisory Given, Teeth Intact   Pulmonary former smoker   Pulmonary exam normal        Cardiovascular hypertension, Pt. on medications Normal cardiovascular exam     Neuro/Psych  Headaches PSYCHIATRIC DISORDERS Anxiety Depression       GI/Hepatic ,GERD  Controlled,,(+) Hepatitis -, C  Endo/Other   Pre-DM   Renal/GU negative Renal ROS     Musculoskeletal  (+) Arthritis ,    Abdominal   Peds  Hematology negative hematology ROS (+)   Anesthesia Other Findings On GLP-1a, last dose over 1 wk ago    Reproductive/Obstetrics                             Anesthesia Physical Anesthesia Plan  ASA: 3  Anesthesia Plan: General   Post-op Pain Management: Tylenol PO (pre-op)* and Ketamine IV*   Induction: Intravenous  PONV Risk Score and Plan: 3 and Treatment may vary due to age or medical condition, Ondansetron and Dexamethasone  Airway Management Planned: Oral ETT  Additional Equipment: None  Intra-op Plan:   Post-operative Plan: Extubation in OR  Informed Consent: I have reviewed the patients History and Physical, chart, labs and discussed the procedure including the risks, benefits and alternatives for the proposed anesthesia with the patient or authorized representative who has indicated his/her understanding and acceptance.     Dental advisory given  Plan Discussed with: CRNA and Anesthesiologist  Anesthesia Plan Comments:        Anesthesia Quick Evaluation

## 2023-03-06 NOTE — Discharge Instructions (Signed)
LAPAROSCOPIC/ROBOTIC SURGERY: POST OP INSTRUCTIONS Always review your discharge instruction sheet given to you by the facility where your surgery was performed. IF YOU HAVE DISABILITY OR FAMILY LEAVE FORMS, YOU MUST BRING THEM TO THE OFFICE FOR PROCESSING.   DO NOT GIVE THEM TO YOUR DOCTOR.  PAIN CONTROL  First take acetaminophen (Tylenol) AND/or ibuprofen (Advil) to control your pain after surgery.  Follow directions on package.  Taking acetaminophen (Tylenol) and/or ibuprofen (Advil) regularly after surgery will help to control your pain and lower the amount of prescription pain medication you may need.  You should not take more than 3,000 mg (3 grams) of acetaminophen (Tylenol) in 24 hours.  You should not take ibuprofen (Advil), aleve, motrin, naprosyn or other NSAIDS if you have a history of stomach ulcers or chronic kidney disease.  A prescription for pain medication may be given to you upon discharge.  Take your pain medication as prescribed, if you still have uncontrolled pain after taking acetaminophen (Tylenol) or ibuprofen (Advil). Use ice packs to help control pain. If you need a refill on your pain medication, please contact your pharmacy.  They will contact our office to request authorization. Prescriptions will not be filled after 5pm or on week-ends.  HOME MEDICATIONS Take your usually prescribed medications unless otherwise directed.  DIET You should follow a light diet the first few days after arrival home.  Be sure to include lots of fluids daily. Avoid fatty, fried foods.   CONSTIPATION It is common to experience some constipation after surgery and if you are taking pain medication.  Increasing fluid intake and taking a stool softener (such as Colace) will usually help or prevent this problem from occurring.  A mild laxative (Milk of Magnesia or Miralax) should be taken according to package instructions if there are no bowel movements after 48 hours.  WOUND/INCISION  CARE Most patients will experience some swelling and bruising in the area of the incisions.  Ice packs will help.  Swelling and bruising can take several days to resolve.  Unless discharge instructions indicate otherwise, follow guidelines below  STERI-STRIPS - you may remove your outer bandages 48 hours after surgery, and you may shower at that time.  You have steri-strips (small skin tapes) in place directly over the incision.  These strips should be left on the skin for 7-10 days.   DERMABOND/SKIN GLUE - you may shower in 24 hours.  The glue will flake off over the next 2-3 weeks. Any sutures or staples will be removed at the office during your follow-up visit.  ACTIVITIES You may resume regular (light) daily activities beginning the next day--such as daily self-care, walking, climbing stairs--gradually increasing activities as tolerated.  You may have sexual intercourse when it is comfortable.  Refrain from any heavy lifting or straining until approved by your doctor. You may drive when you are no longer taking prescription pain medication, you can comfortably wear a seatbelt, and you can safely maneuver your car and apply brakes.  FOLLOW-UP You should see your doctor in the office for a follow-up appointment approximately 2-3 weeks after your surgery.  You should have been given your post-op/follow-up appointment when your surgery was scheduled.  If you did not receive a post-op/follow-up appointment, make sure that you call for this appointment within a day or two after you arrive home to insure a convenient appointment time.  OTHER INSTRUCTIONS NO HEAVY LIFTING/PUSHING/PULLING ANYTHING GREATER THAN 10LBS FOR 4 WEEKS;   WHEN TO CALL YOUR DOCTOR: Fever over  101.0 Inability to urinate Continued bleeding from incision. Increased pain, redness, or drainage from the incision. Increasing abdominal pain  The clinic staff is available to answer your questions during regular business hours.   Please don't hesitate to call and ask to speak to one of the nurses for clinical concerns.  If you have a medical emergency, go to the nearest emergency room or call 911.  A surgeon from [facility] is always on call at the hospital. [address], [address], [place], [place]  [address] ? [address] [address], [place], [place]   [address] [phone] ? [phone] ? FAX [phone] Web site: [email or web address]

## 2023-03-06 NOTE — Op Note (Signed)
PREOPERATIVE DIAGNOSIS: left femoral hernia.    POSTOPERATIVE DIAGNOSIS: left  indirect inguinal hernia   PROCEDURE: Robotic/XI repair of left indirect inguinal hernia with  mesh (rTAPP).  Laparoscopic bilateral TAP block   SURGEON: Mary Sella. Andrey Campanile, MD    ASSISTANT SURGEON: None.    ANESTHESIA: General plus local consisting of 0.25% Marcaine with epi mixed with exparel.    ESTIMATED BLOOD LOSS: Minimal.    FINDINGS: The patient had a left indirect hernia.  It was repaired using Bard 3d midweight left large mesh    SPECIMEN: cord lipoma- discarded   INDICATIONS FOR PROCEDURE: 72 yo presented for repair of a symptomatic inguinal hernia. The risks and benefits including but not limited to bleeding, infection, chronic inguinal pain, nerve entrapment, hernia recurrence, mesh complications, hematoma formation, urinary retention, injury tothe ovaries, numbness in the groin, blood clots, injury to the surrounding structures, and anesthesia risk was discussed with the patient.  Patient had had a prior right paramedian incision as well as of the Pfannenstiel incision for bladder surgery.  We discussed that she may have adhesions which would increase her risk of bleeding, enterotomy, and conversion to open. Preoperative imaging suggested that she had a femoral hernia.   DESCRIPTION OF PROCEDURE: After obtaining verbal consent the patient was then taken back to the operating room, placed  supine on the operating room table. General endotracheal anesthesia was  established. Foley was placed. Sequential compression devices were placed. The  abdomen and groin were prepped and draped in the usual standard surgical  fashion with ChloraPrep. The patient received oral Tylenol preoperatively as well as IV  antibiotics prior to the incision. A surgical time-out was performed.      Optical entry was made using the Optiview technique in the left midclavicular line about 8 cm lateral to the umbilicus a few  centimeters below the left subcostal margin.  Using a 0 degree 5 mm laparoscope through a 5 mm trocar I was able to advance the laparoscope carefully through all layers of the abdominal wall and carefully entered the abdominal cavity.  Pneumoperitoneum was smoothly established up to a patient pressure of 15 mmHg without any change in patient vital signs.  There is no evidence of injury to surrounding structures.  A bilateral laparoscopic tap block was performed for postoperative pain relief.  The patient had omental adhesions to the lower midline probably from her prior Pfannenstiel incision.  The inguinal areas were inspected and there was evidence of a left indirect inguinal hernia and no contralateral inguinal hernia..  Patient was placed in Trendelenburg position.  A robotic 8 mm trocar was placed in the supraumbilical position about 18 cm from the pubic bone.  An additional 8 mm robotic trocar was placed in the right lateral abdominal wall.  The optical entry trocar was exchanged for an 8 mm robotic trocar.  I then took EndoShears with and without electrocautery and took down the omental adhesions from the anterior abdominal wall.  A large piece of left Bard 3D midweight  mesh were placed through the robotic trocar into the abdominal cavity.  I went ahead and placed two  3-0 Vicryl sutures  on SH into the abdomen off to the side.  I then placed one 3-0 absorbable V-Loc sutures through the [facility] trocar into the abdomen off to the side.    We then deployed the robot for pelvic surgery. The robot was docked.  Robotic laparoscope was placed through the supraumbilical trocar and the anatomy was targeted.  The other arms were then connected to the trochars.  A pair of MCS scissors was placed through the right trocar and a fenestrated bipolar through the left trocar all under direct visualization.  I then scrubbed out and went to the robotic console.    I then made incision along the peritoneum on the  left, starting 2 inches above the anterior superior iliac spine and caring it medial toward the median umbilical ligament in a lazy S configuration using MCS scissors with electrocautery.  As I approached the midline the peritoneal flap was a little bit more adhered to the abdominal wall but I was able to get into the correct plane.  The peritoneal flap was then gently dissected downward from the anterior abdominal wall taking care not to  injure the inferior epigastric vessels. The pubic bone was identified.  Dissection continued about 2 cm below the level of the pubic bone.  There is no evidence of obturator or femoral canal hernias.  The round ligament was identified.  Using traction and counter traction, I reduced the sac in  its entirety.  Patient had a fairly moderate retroperitoneal lipoma extending down into the left inguinal canal.  It was reduced from the surrounding hernia sac and from the retroperitoneum.  Bipolar and electrocautery was used to amputate the lipoma from the retroperitoneum.  I ended up taking down the round ligament using bipolar energy device and MCS scissors with energy.      I then went about creating a large pocket by lifting the peritoneum of the pelvic floor. I took great care not to injure the iliac vessels.  The patient's medial umbilical limb is quite prominent and for lack of better description protuberant from the abdominal wall.  I had 1 my partners come in and I just did not think the mesh was going to lay entirely flat so we discussed and we decided that probably the safest thing to do in order to prevent recurrence was to taken the medial umbilical ligament.  It was taken down with with fenestrated bipolar and then ultimately scissors.  I then obtained the previously placed piece of Bard large 3D midweight mesh for the left groin and placed it into the inguinal area.   half of it covered medial  to the inferior epigastric vessels and half of it lateral to the  inferior  epigastric vessels. The defect was well  covered with the mesh.  I then secured the mesh to Cooper's ligament with  two interrupted 3-0 Vicryl suture.  I placed an additional suture superior medially along the edge of the mesh medial to the inferior gastric vessel.  I placed a 3 Vicryl suture laterally along the superior lateral edge of the mesh lateral to the inferior epigastric vessels.  There is no evidence of bleeding in the pelvis.  I then closed the peritoneal flap with a running 3-0 Vicryl V-Loc.  The mesh was well covered.  There is small defect in the peritoneal flap.  It was repaired using a figure of eight 3-0 Vicryl suture.  The mesh was flat.  It had not curled up.   The surgical robot was undocked and moved away from the OR table.  I scrubbed back in.    we then placed a laparoscopic needle driver and the 3 sutures that had been placed in the abdomen at the begin the case were removed.  I also removed the cord lipoma as well as the surgical gauze that I had placed  in the abdomen to help with dissection and hemostasis earlier in the case.  There was no evidence of injury to surrounding structures. Pneumoperitoneum was released, and the remaining trocars were removed. All skin incisions  were closed with a 4-0 Monocryl in a subcuticular fashion followed by application of Steri-Strips and sterile bandages. All needle, instrument, and sponge counts  were correct x2.   There are no immediate complications.  Foley catheter was removed. The patient tolerated the procedure well. The patient was extubated and taken to the  recovery room in stable condition.  Mary Sella. Andrey Campanile, MD, FACS General, Bariatric, & Minimally Invasive Surgery [facility],  A [facility]

## 2023-03-09 ENCOUNTER — Encounter (HOSPITAL_COMMUNITY): Payer: Self-pay | Admitting: General Surgery

## 2023-03-16 NOTE — Anesthesia Postprocedure Evaluation (Signed)
Anesthesia Post Note  Patient: Michele Hanson  Procedure(s) Performed: ROBOTIC REPAIR LEFT INGUINAL HERNIA WITH MESH (Left)     Patient location during evaluation: PACU Anesthesia Type: General Level of consciousness: awake and alert Pain management: pain level controlled Vital Signs Assessment: post-procedure vital signs reviewed and stable Respiratory status: spontaneous breathing, nonlabored ventilation and respiratory function stable Cardiovascular status: stable and blood pressure returned to baseline Anesthetic complications: no   No notable events documented.  Last Vitals:  Vitals:   03/06/23 1342 03/06/23 1431  BP: 122/76 117/76  Pulse: 67 65  Resp: 19 17  Temp: (!) 36.4 C   SpO2: 91% 93%    Last Pain:  Vitals:   03/06/23 1431  TempSrc:   PainSc: 2                  [name]

## 2023-03-23 ENCOUNTER — Other Ambulatory Visit: Payer: Self-pay | Admitting: Family Medicine

## 2023-03-23 DIAGNOSIS — F418 Other specified anxiety disorders: Secondary | ICD-10-CM

## 2023-04-02 ENCOUNTER — Encounter: Payer: Self-pay | Admitting: Family Medicine

## 2023-04-02 ENCOUNTER — Ambulatory Visit: Payer: Medicare Other | Admitting: Family Medicine

## 2023-04-02 VITALS — BP 124/78 | HR 68 | Temp 97.9°F | Wt 162.2 lb

## 2023-04-02 DIAGNOSIS — G8929 Other chronic pain: Secondary | ICD-10-CM | POA: Diagnosis not present

## 2023-04-02 DIAGNOSIS — I10 Essential (primary) hypertension: Secondary | ICD-10-CM

## 2023-04-02 DIAGNOSIS — M25511 Pain in right shoulder: Secondary | ICD-10-CM | POA: Diagnosis not present

## 2023-04-02 MED ORDER — METHOCARBAMOL 500 MG PO TABS: 500.0000 mg | ORAL_TABLET | Freq: Three times a day (TID) | ORAL | 0 refills | Status: DC | PRN

## 2023-04-02 NOTE — Progress Notes (Signed)
Established Patient Office Visit   Subjective:  Patient ID: Michele Hanson, female    DOB: 1950-07-21  Age: 72 y.o. MRN: [id number]  Chief Complaint  Patient presents with   Medical Management of Chronic Issues    3 month follow up, Nonfasting Rx refill methocarmbol. Right shoulder pain     HPI Encounter Diagnoses  Name Primary?   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  No recent injury.  She has been having some discomfort in the right upper posterior shoulder area.  Was given Zanaflex to use for pain management.  It is not agreed with her.  She asked for refill of the Robaxin.  Has done well on the HCTZ alone without the lisinopril.  Blood pressure is well-controlled on HCTZ alone.   Review of Systems  Constitutional: Negative.   HENT: Negative.    Eyes:  Negative for blurred vision, discharge and redness.  Respiratory: Negative.    Cardiovascular: Negative.   Gastrointestinal:  Negative for abdominal pain.  Genitourinary: Negative.   Musculoskeletal:  Positive for joint pain. Negative for myalgias.  Skin:  Negative for rash.  Neurological:  Negative for tingling, loss of consciousness and weakness.  Endo/Heme/Allergies:  Negative for polydipsia.     Current Outpatient Medications:    APPLE CIDER VINEGAR PO, Take 1 capsule by mouth daily., Disp: , Rfl:    aspirin 325 MG tablet, Take 650 mg by mouth 2 (two) times daily., Disp: , Rfl:    atorvastatin (LIPITOR) 20 MG tablet, Take 1 tablet (20 mg total) by mouth daily., Disp: 90 tablet, Rfl: 3   Azelastine HCl 137 MCG/SPRAY SOLN, Place 1 spray into both nostrils 2 (two) times daily as needed (allergies)., Disp: , Rfl:    Brimonidine Tartrate (LUMIFY) 0.025 % SOLN, Place 1 drop into both eyes daily., Disp: , Rfl:    Calcium Carbonate-Simethicone (ALKA-SELTZER HEARTBURN + GAS PO), Take 2 tablets by mouth daily as needed (heartburn)., Disp: , Rfl:     citalopram (CELEXA) 20 MG tablet, TAKE 1 TABLET BY MOUTH 1 TIME DAILY, Disp: 90 tablet, Rfl: 1   Cyanocobalamin (VITAMIN B 12 PO), Take 1 tablet by mouth daily., Disp: , Rfl:    denosumab (PROLIA) 60 MG/ML SOSY injection, Inject 60 mg into the skin every 6 (six) months., Disp: , Rfl:    estradiol (ESTRACE) 0.1 MG/GM vaginal cream, Place 1 Applicatorful vaginally daily as needed (dryness)., Disp: , Rfl:    fluticasone (FLONASE) 50 MCG/ACT nasal spray, USE ONE SPRAY IN EACH NOSTRIL ONE TIME DAILY *SHAKE BEFORE USING* (Patient taking differently: Place 1 spray into both nostrils daily as needed for allergies.), Disp: 16 mL, Rfl: 4   gabapentin (NEURONTIN) 400 MG capsule, TAKE ONE CAPSULE BY MOUTH THREE TIMES A DAY (Patient taking differently: Take 400 mg by mouth 2 (two) times daily.), Disp: 90 capsule, Rfl: 2   hydrochlorothiazide (HYDRODIURIL) 25 MG tablet, Take 1 tablet (25 mg total) by mouth daily., Disp: 90 tablet, Rfl: 3   loperamide (IMODIUM A-D) 2 MG tablet, Take 2 mg by mouth 4 (four) times daily as needed for diarrhea or loose stools., Disp: , Rfl:    meloxicam (MOBIC) 15 MG tablet, May take 1 daily as needed., Disp: 90 tablet, Rfl: 0   methocarbamol (ROBAXIN) 500 MG tablet, Take 1 tablet (500 mg total) by mouth every 8 (eight) hours as needed for muscle spasms., Disp: 30 tablet, Rfl: 0  Milk Thistle 250 MG CAPS, Take 250 mg by mouth daily., Disp: , Rfl:    Multiple Vitamins-Minerals (ADULT GUMMY PO), Take 2 capsules by mouth daily., Disp: , Rfl:    omeprazole (PRILOSEC) 20 MG capsule, TAKE 1 CAPSULE BY MOUTH 1 TIME DAILY, Disp: 90 capsule, Rfl: 1   Prasterone, DHEA, (DHEA PO), Take 1 tablet by mouth daily., Disp: , Rfl:    SEMAGLUTIDE-WEIGHT MANAGEMENT [place], Inject 1 Dose into the skin every 7 (seven) days., Disp: , Rfl:    Sennosides (EX-LAX PO), Take 1 tablet by mouth daily as needed (constipation)., Disp: , Rfl:    tiZANidine (ZANAFLEX) 4 MG tablet, Take 4 mg by mouth daily as needed  for muscle spasms., Disp: , Rfl:    Turmeric (QC TUMERIC COMPLEX PO), Take 1 tablet by mouth daily., Disp: , Rfl:    Vitamin D, Ergocalciferol, (DRISDOL) 1.25 MG (50000 UNIT) CAPS capsule, Take 50,000 Units by mouth every Thursday., Disp: , Rfl:    oxyCODONE (OXY IR/ROXICODONE) 5 MG immediate release tablet, Take 1 tablet (5 mg total) by mouth every 6 (six) hours as needed for severe pain (pain score 7-10)., Disp: 15 tablet, Rfl: 0   Objective:     BP 124/78 (BP Location: Left Arm, Patient Position: Sitting, Cuff Size: Normal)   Pulse 68   Temp 97.9 F (36.6 C)   Wt 162 lb 3.2 oz (73.6 kg)   SpO2 97%   BMI 26.58 kg/m  BP Readings from Last 3 Encounters:  04/02/23 124/78  03/06/23 117/76  02/25/23 (!) 140/86   Wt Readings from Last 3 Encounters:  04/02/23 162 lb 3.2 oz (73.6 kg)  02/25/23 155 lb (70.3 kg)  01/01/23 153 lb 12.8 oz (69.8 kg)      Physical Exam Constitutional:      General: She is not in acute distress.    Appearance: Normal appearance. She is not ill-appearing, toxic-appearing or diaphoretic.  HENT:     Head: Normocephalic and atraumatic.     Right Ear: External ear normal.     Left Ear: External ear normal.  Eyes:     General: No scleral icterus.       Right eye: No discharge.        Left eye: No discharge.     Extraocular Movements: Extraocular movements intact.     Conjunctiva/sclera: Conjunctivae normal.  Pulmonary:     Effort: Pulmonary effort is normal. No respiratory distress.  Musculoskeletal:     Right shoulder: Tenderness present. No bony tenderness. Normal range of motion.     Left shoulder: Normal range of motion.     Comments: Mildly positive impingement test with tenderness to palpation of the subacromial bursa.  Skin:    General: Skin is warm and dry.  Neurological:     Mental Status: She is alert and oriented to person, place, and time.  Psychiatric:        Mood and Affect: Mood normal.        Behavior: Behavior normal.      No  results found for any visits on 04/02/23.    The 10-year ASCVD risk score (Arnett DK, et al., 2019) is: 13.8%    Assessment & Plan:   Chronic right shoulder pain -     Methocarbamol; Take 1 tablet (500 mg total) by mouth every 8 (eight) hours as needed for muscle spasms.  Dispense: 30 tablet; Refill: 0  Essential hypertension    Return in about 3 months (around 07/01/2023).  Continue HCTZ for hypertension.  Continue meloxicam.  Prescription for Robaxin given.  Recommended follow-up with orthopedics.  Advised that Robaxin should be coming from her pain management physician.  Mliss Sax, MD

## 2023-04-27 ENCOUNTER — Other Ambulatory Visit: Payer: Self-pay | Admitting: Family Medicine

## 2023-05-05 ENCOUNTER — Other Ambulatory Visit: Payer: Self-pay | Admitting: Urology

## 2023-05-05 DIAGNOSIS — M25511 Pain in right shoulder: Secondary | ICD-10-CM

## 2023-05-08 ENCOUNTER — Ambulatory Visit
Admission: RE | Admit: 2023-05-08 | Discharge: 2023-05-08 | Disposition: A | Discharge status: Home / Self Care | Payer: Medicare Other | Source: Ambulatory Visit | Attending: Urology | Admitting: Urology

## 2023-05-08 DIAGNOSIS — M25511 Pain in right shoulder: Secondary | ICD-10-CM

## 2023-05-28 ENCOUNTER — Ambulatory Visit: Payer: Medicare Other | Admitting: *Deleted

## 2023-05-28 VITALS — BP 150/97 | HR 82 | Temp 98.1°F | Resp 16 | Ht 65.5 in | Wt 164.6 lb

## 2023-05-28 DIAGNOSIS — M810 Age-related osteoporosis without current pathological fracture: Secondary | ICD-10-CM

## 2023-05-28 MED ORDER — DENOSUMAB 60 MG/ML ~~LOC~~ SOSY
60.0000 mg | PREFILLED_SYRINGE | Freq: Once | SUBCUTANEOUS | Status: AC
Start: 2023-05-28 — End: 2023-05-28
  Administered 2023-05-28: 60 mg via SUBCUTANEOUS
  Filled 2023-05-28: qty 1

## 2023-05-28 NOTE — Progress Notes (Signed)
Diagnosis: Osteoporosis  Provider:  Chilton Greathouse MD  Procedure: Injection  Prolia (Denosumab), Dose: 60 mg, Site: subcutaneous, Number of injections: 1  Injection Site(s): Left upper quad. abdomen  Post Care: Observation period completed  Discharge: Condition: Good, Destination: Home . AVS Declined  Performed by:  Forrest Moron, RN

## 2023-06-30 ENCOUNTER — Ambulatory Visit (INDEPENDENT_AMBULATORY_CARE_PROVIDER_SITE_OTHER): Payer: Medicare Other | Admitting: Family Medicine

## 2023-06-30 ENCOUNTER — Encounter: Payer: Self-pay | Admitting: Family Medicine

## 2023-06-30 VITALS — BP 122/68 | HR 78 | Temp 97.7°F | Ht 65.0 in | Wt 164.4 lb

## 2023-06-30 DIAGNOSIS — Z131 Encounter for screening for diabetes mellitus: Secondary | ICD-10-CM

## 2023-06-30 DIAGNOSIS — E559 Vitamin D deficiency, unspecified: Secondary | ICD-10-CM

## 2023-06-30 DIAGNOSIS — E538 Deficiency of other specified B group vitamins: Secondary | ICD-10-CM | POA: Diagnosis not present

## 2023-06-30 DIAGNOSIS — I10 Essential (primary) hypertension: Secondary | ICD-10-CM | POA: Diagnosis not present

## 2023-06-30 DIAGNOSIS — E7800 Pure hypercholesterolemia, unspecified: Secondary | ICD-10-CM | POA: Diagnosis not present

## 2023-06-30 NOTE — Progress Notes (Signed)
 Established Patient Office Visit   Subjective:  Patient ID: Michele Hanson, female    DOB: [birth year]  Age: 73 y.o. MRN: [id number]  Chief Complaint  Patient presents with   Medical Management of Chronic Issues    3 month follow up. Pt is not fasting.     HPI Encounter Diagnoses  Name Primary?   Essential hypertension Yes   Elevated cholesterol    Screening for diabetes mellitus    B12 deficiency    Vitamin D deficiency    For follow-up of above.  Continues 25 mg HCTZ for hypertension.  20 mg of atorvastatin for elevated cholesterol.  She is on semaglutide 0.25 mL per weight management clinic.  Continues high-dose weekly vitamin D and cyanocobalamin.  Recently diagnosed with beginning macular degeneration.  Suffered an acromial stress fracture associated with reverse shoulder replacement surgery.   Review of Systems  Constitutional: Negative.   HENT: Negative.    Eyes:  Negative for blurred vision, discharge and redness.  Respiratory: Negative.    Cardiovascular: Negative.   Gastrointestinal:  Negative for abdominal pain.  Genitourinary: Negative.   Musculoskeletal:  Positive for joint pain. Negative for myalgias.  Skin:  Negative for rash.  Neurological:  Negative for tingling, loss of consciousness and weakness.  Endo/Heme/Allergies:  Negative for polydipsia.     Current Outpatient Medications:    aspirin 325 MG tablet, Take 650 mg by mouth 2 (two) times daily., Disp: , Rfl:    atorvastatin (LIPITOR) 20 MG tablet, Take 1 tablet (20 mg total) by mouth daily., Disp: 90 tablet, Rfl: 3   Azelastine HCl 137 MCG/SPRAY SOLN, Place 1 spray into both nostrils 2 (two) times daily as needed (allergies)., Disp: , Rfl:    Brimonidine Tartrate (LUMIFY) 0.025 % SOLN, Place 1 drop into both eyes daily., Disp: , Rfl:    Calcium Carbonate-Simethicone (ALKA-SELTZER HEARTBURN + GAS PO), Take 2 tablets by mouth daily as needed (heartburn)., Disp: , Rfl:    citalopram (CELEXA) 20 MG  tablet, TAKE 1 TABLET BY MOUTH 1 TIME DAILY, Disp: 90 tablet, Rfl: 1   Cyanocobalamin (VITAMIN B 12 PO), Take 1 tablet by mouth daily., Disp: , Rfl:    denosumab (PROLIA) 60 MG/ML SOSY injection, Inject 60 mg into the skin every 6 (six) months., Disp: , Rfl:    estradiol (ESTRACE) 0.1 MG/GM vaginal cream, Place 1 Applicatorful vaginally daily as needed (dryness)., Disp: , Rfl:    fluticasone (FLONASE) 50 MCG/ACT nasal spray, USE 1 SPRAY IN EACH NOSTRIL 1 TIME DAILY, Disp: 48 mL, Rfl: 1   hydrochlorothiazide (HYDRODIURIL) 25 MG tablet, Take 1 tablet (25 mg total) by mouth daily., Disp: 90 tablet, Rfl: 3   loperamide (IMODIUM A-D) 2 MG tablet, Take 2 mg by mouth 4 (four) times daily as needed for diarrhea or loose stools., Disp: , Rfl:    meloxicam (MOBIC) 15 MG tablet, May take 1 daily as needed., Disp: 90 tablet, Rfl: 0   methocarbamol (ROBAXIN) 500 MG tablet, Take 1 tablet (500 mg total) by mouth every 8 (eight) hours as needed for muscle spasms., Disp: 30 tablet, Rfl: 0   Milk Thistle 250 MG CAPS, Take 250 mg by mouth daily., Disp: , Rfl:    Multiple Vitamins-Minerals (ADULT GUMMY PO), Take 2 capsules by mouth daily., Disp: , Rfl:    omeprazole (PRILOSEC) 20 MG capsule, TAKE 1 CAPSULE BY MOUTH 1 TIME DAILY, Disp: 90 capsule, Rfl: 1   Prasterone, DHEA, (DHEA PO), Take 1 tablet by mouth  daily., Disp: , Rfl:    Sennosides (EX-LAX PO), Take 1 tablet by mouth daily as needed (constipation)., Disp: , Rfl:    tiZANidine (ZANAFLEX) 4 MG tablet, Take 4 mg by mouth daily as needed for muscle spasms., Disp: , Rfl:    Turmeric (QC TUMERIC COMPLEX PO), Take 1 tablet by mouth daily., Disp: , Rfl:    Vitamin D, Ergocalciferol, (DRISDOL) 1.25 MG (50000 UNIT) CAPS capsule, Take 50,000 Units by mouth every Thursday., Disp: , Rfl:    APPLE CIDER VINEGAR PO, Take 1 capsule by mouth daily., Disp: , Rfl:    SEMAGLUTIDE-WEIGHT MANAGEMENT [place], Inject 1 Dose into the skin every 7 (seven) days. (Patient not taking:  Reported on 06/30/2023), Disp: , Rfl:    Objective:     BP 122/68   Pulse 78   Temp 97.7 F (36.5 C)   Ht 5\' 5"  (1.651 m)   Wt 164 lb 6.4 oz (74.6 kg)   SpO2 98%   BMI 27.36 kg/m  BP Readings from Last 3 Encounters:  06/30/23 122/68  05/28/23 (!) 150/97  04/02/23 124/78   Wt Readings from Last 3 Encounters:  06/30/23 164 lb 6.4 oz (74.6 kg)  05/28/23 164 lb 9.6 oz (74.7 kg)  04/02/23 162 lb 3.2 oz (73.6 kg)      Physical Exam Constitutional:      General: She is not in acute distress.    Appearance: Normal appearance. She is not ill-appearing, toxic-appearing or diaphoretic.  HENT:     Head: Normocephalic and atraumatic.     Right Ear: External ear normal.     Left Ear: External ear normal.     Mouth/Throat:     Mouth: Mucous membranes are moist.     Pharynx: Oropharynx is clear. No oropharyngeal exudate or posterior oropharyngeal erythema.  Eyes:     General: No scleral icterus.       Right eye: No discharge.        Left eye: No discharge.     Extraocular Movements: Extraocular movements intact.     Conjunctiva/sclera: Conjunctivae normal.     Pupils: Pupils are equal, round, and reactive to light.  Cardiovascular:     Rate and Rhythm: Normal rate and regular rhythm.  Pulmonary:     Effort: Pulmonary effort is normal. No respiratory distress.     Breath sounds: Normal breath sounds.  Musculoskeletal:       Arms:     Cervical back: No rigidity or tenderness.  Skin:    General: Skin is warm and dry.  Neurological:     Mental Status: She is alert and oriented to person, place, and time.  Psychiatric:        Mood and Affect: Mood normal.        Behavior: Behavior normal.      No results found for any visits on 06/30/23.     The 10-year ASCVD risk score (Arnett DK, et al., 2019) is: 13.4%    Assessment & Plan:   Essential hypertension -     CBC; Future -     Comprehensive metabolic panel; Future -     Urinalysis, Routine w reflex microscopic;  Future  Elevated cholesterol -     Comprehensive metabolic panel; Future -     Lipid panel; Future  Screening for diabetes mellitus -     Comprehensive metabolic panel; Future -     Hemoglobin A1c; Future  B12 deficiency -     Vitamin B12; Future  Vitamin D deficiency -     VITAMIN D 25 Hydroxy (Vit-D Deficiency, Fractures); Future    Return in about 6 months (around 12/31/2023), or Return fasting for above ordered blood work..  Adjustments made pending results.  Will return to walking for exercise.  See weight management for increased dosing of semaglutide.   Mliss Sax, MD

## 2023-07-07 ENCOUNTER — Other Ambulatory Visit (INDEPENDENT_AMBULATORY_CARE_PROVIDER_SITE_OTHER)

## 2023-07-07 ENCOUNTER — Encounter: Payer: Self-pay | Admitting: Family Medicine

## 2023-07-07 DIAGNOSIS — E538 Deficiency of other specified B group vitamins: Secondary | ICD-10-CM

## 2023-07-07 DIAGNOSIS — I10 Essential (primary) hypertension: Secondary | ICD-10-CM

## 2023-07-07 DIAGNOSIS — E7800 Pure hypercholesterolemia, unspecified: Secondary | ICD-10-CM

## 2023-07-07 DIAGNOSIS — E559 Vitamin D deficiency, unspecified: Secondary | ICD-10-CM | POA: Diagnosis not present

## 2023-07-07 DIAGNOSIS — Z131 Encounter for screening for diabetes mellitus: Secondary | ICD-10-CM

## 2023-07-07 LAB — CBC
HCT: 42.5 % (ref 36.0–46.0)
Hemoglobin: 14.3 g/dL (ref 12.0–15.0)
MCHC: 33.6 g/dL (ref 30.0–36.0)
MCV: 91.8 fl (ref 78.0–100.0)
Platelets: 191 10*3/uL (ref 150.0–400.0)
RBC: 4.63 Mil/uL (ref 3.87–5.11)
RDW: 13 % (ref 11.5–15.5)
WBC: 5.6 10*3/uL (ref 4.0–10.5)

## 2023-07-07 LAB — COMPREHENSIVE METABOLIC PANEL
ALT: 21 U/L (ref 0–35)
AST: 17 U/L (ref 0–37)
Albumin: 4.4 g/dL (ref 3.5–5.2)
Alkaline Phosphatase: 57 U/L (ref 39–117)
BUN: 17 mg/dL (ref 6–23)
CO2: 30 meq/L (ref 19–32)
Calcium: 9.1 mg/dL (ref 8.4–10.5)
Chloride: 98 meq/L (ref 96–112)
Creatinine, Ser: 0.76 mg/dL (ref 0.40–1.20)
GFR: 78.29 mL/min (ref >60.00)
Glucose, Bld: 91 mg/dL (ref 70–99)
Potassium: 3.5 meq/L (ref 3.5–5.1)
Sodium: 139 meq/L (ref 135–145)
Total Bilirubin: 0.7 mg/dL (ref 0.2–1.2)
Total Protein: 6.7 g/dL (ref 6.0–8.3)

## 2023-07-07 LAB — VITAMIN D 25 HYDROXY (VIT D DEFICIENCY, FRACTURES): VITD: 61.02 ng/mL (ref 30.00–100.00)

## 2023-07-07 LAB — LIPID PANEL
Cholesterol: 184 mg/dL (ref 0–200)
HDL: 70.8 mg/dL
LDL Cholesterol: 87 mg/dL (ref 0–99)
NonHDL: 113.5
Total CHOL/HDL Ratio: 3
Triglycerides: 132 mg/dL (ref 0.0–149.0)
VLDL: 26.4 mg/dL (ref 0.0–40.0)

## 2023-07-07 LAB — HEMOGLOBIN A1C: Hgb A1c MFr Bld: 5.8 % (ref 4.6–6.5)

## 2023-07-07 LAB — VITAMIN B12: Vitamin B-12: 917 pg/mL — ABNORMAL HIGH (ref 211–911)

## 2023-07-07 NOTE — Addendum Note (Signed)
 Addended by: Andrez Grime on: 07/07/2023 05:19 PM   Modules accepted: Orders

## 2023-08-05 ENCOUNTER — Other Ambulatory Visit: Payer: Self-pay | Admitting: Family Medicine

## 2023-08-14 ENCOUNTER — Ambulatory Visit (INDEPENDENT_AMBULATORY_CARE_PROVIDER_SITE_OTHER): Payer: Medicare Other

## 2023-08-14 DIAGNOSIS — Z0000 Encounter for general adult medical examination without abnormal findings: Secondary | ICD-10-CM | POA: Diagnosis not present

## 2023-08-14 NOTE — Patient Instructions (Signed)
 Michele Hanson , Thank you for taking time to come for your Medicare Wellness Visit. I appreciate your ongoing commitment to your health goals. Please review the following plan we discussed and let me know if I can assist you in the future.   Referrals/Orders/Follow-Ups/Clinician Recommendations: none  This is a list of the screening recommended for you and due dates:  Health Maintenance  Topic Date Due   Mammogram  09/12/2023   Flu Shot  11/13/2023   Medicare Annual Wellness Visit  08/13/2024   DTaP/Tdap/Td vaccine (2 - Td or Tdap) 04/13/2028   Colon Cancer Screening  09/11/2031   Pneumonia Vaccine  Completed   DEXA scan (bone density measurement)  Completed   Hepatitis C Screening  Completed   Zoster (Shingles) Vaccine  Completed   HPV Vaccine  Aged Out   Meningitis B Vaccine  Aged Out   COVID-19 Vaccine  Discontinued    Advanced directives: (Copy Requested) Please bring a copy of your health care power of attorney and living will to the office to be added to your chart at your convenience. You can mail to [facility] [address] [place], [place] [address] or email to [email or web address]  Next Medicare Annual Wellness Visit scheduled for next year: Yes  Have you seen your provider in the last 6 months (3 months if uncontrolled diabetes)? Yes, has appointment 10/01/2023  insert Preventive Care attachment Insert FALL PREVENTION attachment if needed

## 2023-08-14 NOTE — Progress Notes (Signed)
 Subjective:   Michele Hanson is a 73 y.o. who presents for a Medicare Wellness preventive visit.  Visit Complete: Virtual I connected with  Michele Hanson on 08/14/23 by a audio enabled telemedicine application and verified that I am speaking with the correct person using two identifiers.  Patient Location: Home  Provider Location: Office/Clinic  I discussed the limitations of evaluation and management by telemedicine. The patient expressed understanding and agreed to proceed.  Vital Signs: Because this visit was a virtual/telehealth visit, some criteria may be missing or patient reported. Any vitals not documented were not able to be obtained and vitals that have been documented are patient reported.  VideoError- Librarian, academic were attempted between this provider and patient, however failed, due to patient having technical difficulties OR patient did not have access to video capability.  We continued and completed visit with audio only.   Persons Participating in Visit: Patient.  AWV Questionnaire: No: Patient Medicare AWV questionnaire was not completed prior to this visit.  Cardiac Risk Factors include: advanced age (>94men, >57 women);hypertension     Objective:    Today's Vitals   08/14/23 1337  PainSc: 4    There is no height or weight on file to calculate BMI.     08/14/2023    1:50 PM 02/25/2023   11:28 AM 12/31/2022   11:04 AM 08/11/2022    1:32 PM 08/07/2021    3:02 PM 04/17/2021   11:14 AM 03/20/2021    7:05 AM  Advanced Directives  Does Patient Have a Medical Advance Directive? Yes Yes Yes Yes Yes Yes Yes  Type of Estate agent of [place];Living will Healthcare Power of [employer] of [place];Living will Healthcare Power of [place];Living will Healthcare Power of [place];Living will Healthcare Power of [place];Living will   Does patient want to make changes to medical advance directive?  No -  Patient declined    No - Patient declined No - Patient declined  Copy of Healthcare Power of Attorney in Chart? No - copy requested   No - copy requested No - copy requested      Current Medications (verified) Outpatient Encounter Medications as of 08/14/2023  Medication Sig   aspirin  325 MG tablet Take 650 mg by mouth 2 (two) times daily.   atorvastatin  (LIPITOR) 20 MG tablet Take 1 tablet (20 mg total) by mouth daily.   Azelastine  HCl 137 MCG/SPRAY SOLN Place 1 spray into both nostrils 2 (two) times daily as needed (allergies).   Brimonidine Tartrate (LUMIFY) 0.025 % SOLN Place 1 drop into both eyes daily.   Calcium  Carbonate-Simethicone (ALKA-SELTZER HEARTBURN + GAS PO) Take 2 tablets by mouth daily as needed (heartburn).   citalopram  (CELEXA ) 20 MG tablet TAKE 1 TABLET BY MOUTH 1 TIME DAILY   Cyanocobalamin  (VITAMIN B 12 PO) Take 1 tablet by mouth daily.   denosumab  (PROLIA ) 60 MG/ML SOSY injection Inject 60 mg into the skin every 6 (six) months.   estradiol (ESTRACE) 0.1 MG/GM vaginal cream Place 1 Applicatorful vaginally daily as needed (dryness).   fluticasone  (FLONASE ) 50 MCG/ACT nasal spray USE 1 SPRAY IN EACH NOSTRIL 1 TIME DAILY   gabapentin  (NEURONTIN ) 400 MG capsule TAKE 1 CAPSULE BY MOUTH THREE TIMES A DAY   hydrochlorothiazide  (HYDRODIURIL ) 25 MG tablet Take 1 tablet (25 mg total) by mouth daily.   loperamide (IMODIUM A-D) 2 MG tablet Take 2 mg by mouth 4 (four) times daily as needed for diarrhea or loose stools.  LUTEIN PO Take 1 tablet by mouth daily.   Milk Thistle 250 MG CAPS Take 250 mg by mouth daily.   Multiple Vitamins-Minerals (ADULT GUMMY PO) Take 2 capsules by mouth daily.   omeprazole  (PRILOSEC) 20 MG capsule TAKE 1 CAPSULE BY MOUTH 1 TIME DAILY   Prasterone, DHEA, (DHEA PO) Take 1 tablet by mouth daily.   Sennosides (EX-LAX PO) Take 1 tablet by mouth daily as needed (constipation).   tiZANidine (ZANAFLEX) 4 MG tablet Take 4 mg by mouth daily as needed for muscle  spasms.   meloxicam  (MOBIC ) 15 MG tablet May take 1 daily as needed. (Patient not taking: Reported on 08/14/2023)   methocarbamol  (ROBAXIN ) 500 MG tablet Take 1 tablet (500 mg total) by mouth every 8 (eight) hours as needed for muscle spasms. (Patient not taking: Reported on 08/14/2023)   SEMAGLUTIDE-WEIGHT MANAGEMENT [place] Inject 1 Dose into the skin every 7 (seven) days. (Patient not taking: Reported on 06/30/2023)   Turmeric (QC TUMERIC COMPLEX PO) Take 1 tablet by mouth daily. (Patient not taking: Reported on 08/14/2023)   No facility-administered encounter medications on file as of 08/14/2023.    Allergies (verified) Ciprofloxacin, Neomycin, and Nickel   History: Past Medical History:  Diagnosis Date   Anxiety    Arthritis    Depression    Fatty liver disease, nonalcoholic    GERD (gastroesophageal reflux disease)    Headache    Hepatitis 1973   hep c-was treated   Hyperlipemia    Hypertension    Neuromuscular disorder (HCC)    Past Surgical History:  Procedure Laterality Date   BACK SURGERY     CARPAL TUNNEL RELEASE Bilateral    CHOLECYSTECTOMY     DIAGNOSTIC LAPAROSCOPY     FEMORAL HERNIA REPAIR Left 03/06/2023   Procedure: ROBOTIC REPAIR LEFT INGUINAL HERNIA WITH MESH;  Surgeon: Aldean Hummingbird, MD;  Location: [facility];  Service: General;  Laterality: Left;   INCONTINENCE SURGERY     LAPAROSCOPIC LYSIS OF ADHESIONS     REVERSE SHOULDER ARTHROPLASTY Right 03/20/2021   Procedure: REVERSE SHOULDER ARTHROPLASTY;  Surgeon: Micheline Ahr, MD;  Location: [facility];  Service: Orthopedics;  Laterality: Right;   Family History  Family history unknown: Yes   Social History   Socioeconomic History   Marital status: Married    Spouse name: Not on file   Number of children: Not on file   Years of education: Not on file   Highest education level: Master's degree (e.g., MA, MS, MEng, MEd, MSW, MBA)  Occupational History   Not on file  Tobacco Use   Smoking status: Former     Types: Cigarettes   Smokeless tobacco: Never  Vaping Use   Vaping status: Never Used  Substance and Sexual Activity   Alcohol use: Yes    Comment: several times per week   Drug use: Never   Sexual activity: Yes    Birth control/protection: Post-menopausal  Other Topics Concern   Not on file  Social History Narrative   Not on file   Social Drivers of Health   Financial Resource Strain: Low Risk  (08/14/2023)   Overall Financial Resource Strain (CARDIA)    Difficulty of Paying Living Expenses: Not hard at all  Food Insecurity: No Food Insecurity (08/14/2023)   Hunger Vital Sign    Worried About Running Out of Food in the Last Year: Never true    Ran Out of Food in the Last Year: Never true  Transportation Needs:  No Transportation Needs (08/14/2023)   PRAPARE - Administrator, Civil Service (Medical): No    Lack of Transportation (Non-Medical): No  Physical Activity: Sufficiently Active (08/14/2023)   Exercise Vital Sign    Days of Exercise per Week: 4 days    Minutes of Exercise per Session: 60 min  Stress: No Stress Concern Present (08/14/2023)   [employer] of Occupational Health - Occupational Stress Questionnaire    Feeling of Stress : Not at all  Social Connections: Unknown (08/14/2023)   Social Connection and Isolation Panel [NHANES]    Frequency of Communication with Friends and Family: Once a week    Frequency of Social Gatherings with Friends and Family: Patient declined    Attends Religious Services: Never    Database administrator or Organizations: No    Attends Engineer, structural: Never    Marital Status: Married    Tobacco Counseling Counseling given: Not Answered    Clinical Intake:  Pre-visit preparation completed: Yes  Pain : 0-10 Pain Score: 4  Pain Type: Chronic pain Pain Location: Hip Pain Orientation: Left, Right Pain Descriptors / Indicators: Aching Pain Onset: More than a month ago Pain Frequency: Constant      Nutritional Risks: None Diabetes: No  Lab Results  Component Value Date   HGBA1C 5.8 07/07/2023   HGBA1C 5.5 06/25/2022   HGBA1C 5.8 01/01/2022     How often do you need to have someone help you when you read instructions, pamphlets, or other written materials from your doctor or pharmacy?: 1 - Never  Interpreter Needed?: No  Information entered by :: NAllen LPN   Activities of Daily Living     08/14/2023    1:39 PM 02/25/2023   11:31 AM  In your present state of health, do you have any difficulty performing the following activities:  Hearing? 0   Vision? 0   Difficulty concentrating or making decisions? 0   Walking or climbing stairs? 0   Dressing or bathing? 0   Doing errands, shopping? 0 0  Preparing Food and eating ? N   Using the Toilet? N   In the past six months, have you accidently leaked urine? Y   Comment had a bladder operation   Do you have problems with loss of bowel control? N   Managing your Medications? N   Managing your Finances? N   Housekeeping or managing your Housekeeping? N     Patient Care Team: Tonna Frederic, MD as PCP - General (Family Medicine)  Indicate any recent Medical Services you may have received from other than [facility] providers in the past year (date may be approximate).     Assessment:   This is a routine wellness examination for [place].  Hearing/Vision screen Hearing Screening - Comments:: Denies hearing issues Vision Screening - Comments:: Regular eye exams, [facility]   Goals Addressed             This Visit's Progress    Patient Stated       08/14/2023, wants to lose weight to get BMI       Depression Screen     08/14/2023    1:53 PM 04/02/2023   11:21 AM 08/11/2022    1:32 PM 06/25/2022   11:16 AM 08/07/2021    3:03 PM 08/07/2021    3:00 PM 07/02/2021   11:15 AM  PHQ 2/9 Scores  PHQ - 2 Score 0 0 0 0 0 0 0  PHQ- 9 Score 0 0         Fall Risk     08/14/2023    1:51 PM 04/02/2023   11:21 AM  10/06/2022    1:51 PM 08/08/2022    1:48 PM 06/25/2022   11:16 AM  Fall Risk   Falls in the past year? 1 1 0 0 0  Comment slipped      Number falls in past yr: 0 1 0 0 0  Injury with Fall? 0 0 0 0 0  Risk for fall due to : Medication side effect History of fall(s) No Fall Risks Medication side effect No Fall Risks  Follow up Falls prevention discussed;Falls evaluation completed Falls evaluation completed Falls evaluation completed Falls prevention discussed;Education provided;Falls evaluation completed Falls evaluation completed    MEDICARE RISK AT HOME:  Medicare Risk at Home Any stairs in or around the home?: Yes If so, are there any without handrails?: Yes Home free of loose throw rugs in walkways, pet beds, electrical cords, etc?: Yes Adequate lighting in your home to reduce risk of falls?: Yes Life alert?: No Use of a cane, walker or w/c?: No Grab bars in the bathroom?: No Shower chair or bench in shower?: Yes Elevated toilet seat or a handicapped toilet?: No  TIMED UP AND GO:  Was the test performed?  No  Cognitive Function: 6CIT completed        08/14/2023    1:53 PM 08/11/2022    1:33 PM  6CIT Screen  What Year? 0 points 0 points  What month? 0 points 0 points  What time? 0 points 0 points  Count back from 20 0 points 0 points  Months in reverse 0 points 0 points  Repeat phrase 0 points 2 points  Total Score 0 points 2 points    Immunizations Immunization History  Administered Date(s) Administered   Fluad Quad(high Dose 65+) 01/18/2019   Fluad Trivalent(High Dose 65+) 01/01/2023   Influenza, High Dose Seasonal PF 03/16/2018, 12/27/2020, 01/01/2022   Influenza-Unspecified 03/14/2020   Moderna Sars-Covid-2 Vaccination 04/03/2020   PFIZER(Purple Top)SARS-COV-2 Vaccination 07/28/2019, 08/18/2019   PNEUMOCOCCAL CONJUGATE-20 08/31/2021   Tdap 04/13/2018   Zoster Recombinant(Shingrix) 03/14/2020, 07/08/2020    Screening Tests Health Maintenance  Topic Date Due    MAMMOGRAM  09/12/2023   INFLUENZA VACCINE  11/13/2023   Medicare Annual Wellness (AWV)  08/13/2024   DTaP/Tdap/Td (2 - Td or Tdap) 04/13/2028   Colonoscopy  09/11/2031   Pneumonia Vaccine 39+ Years old  Completed   DEXA SCAN  Completed   Hepatitis C Screening  Completed   Zoster Vaccines- Shingrix  Completed   HPV VACCINES  Aged Out   Meningococcal B Vaccine  Aged Out   COVID-19 Vaccine  Discontinued    Health Maintenance  There are no preventive care reminders to display for this patient.  Health Maintenance Items Addressed: Up to date  Additional Screening:  Vision Screening: Recommended annual ophthalmology exams for early detection of glaucoma and other disorders of the eye.  Dental Screening: Recommended annual dental exams for proper oral hygiene  Community Resource Referral / Chronic Care Management: CRR required this visit?  No   CCM required this visit?  No     Plan:     I have personally reviewed and noted the following in the patient's chart:   Medical and social history Use of alcohol, tobacco or illicit drugs  Current medications and supplements including opioid prescriptions. Patient is not currently taking opioid prescriptions.  Functional ability and status Nutritional status Physical activity Advanced directives List of other physicians Hospitalizations, surgeries, and ER visits in previous 12 months Vitals Screenings to include cognitive, depression, and falls Referrals and appointments  In addition, I have reviewed and discussed with patient certain preventive protocols, quality metrics, and best practice recommendations. A written personalized care plan for preventive services as well as general preventive health recommendations were provided to patient.     Areatha Beecham, LPN   0/07/5407   After Visit Summary: (MyChart) Due to this being a telephonic visit, the after visit summary with patients personalized plan was offered to patient  via MyChart   Notes: Nothing significant to report at this time.

## 2023-09-18 ENCOUNTER — Other Ambulatory Visit: Payer: Self-pay | Admitting: Family Medicine

## 2023-09-18 DIAGNOSIS — F418 Other specified anxiety disorders: Secondary | ICD-10-CM

## 2023-10-20 LAB — HM MAMMOGRAPHY

## 2023-10-20 LAB — HM PAP SMEAR

## 2023-10-27 ENCOUNTER — Other Ambulatory Visit: Payer: Self-pay | Admitting: Family Medicine

## 2023-11-16 ENCOUNTER — Other Ambulatory Visit: Payer: Self-pay

## 2023-11-27 ENCOUNTER — Ambulatory Visit (INDEPENDENT_AMBULATORY_CARE_PROVIDER_SITE_OTHER): Payer: Medicare Other | Admitting: *Deleted

## 2023-11-27 VITALS — BP 134/89 | HR 80 | Temp 98.2°F | Resp 16 | Ht 65.0 in | Wt 156.0 lb

## 2023-11-27 DIAGNOSIS — M810 Age-related osteoporosis without current pathological fracture: Secondary | ICD-10-CM

## 2023-11-27 MED ORDER — DENOSUMAB 60 MG/ML ~~LOC~~ SOSY
60.0000 mg | PREFILLED_SYRINGE | Freq: Once | SUBCUTANEOUS | Status: AC
Start: 2023-11-27 — End: 2023-11-27
  Administered 2023-11-27: 60 mg via SUBCUTANEOUS

## 2023-11-27 NOTE — Progress Notes (Signed)
 Diagnosis: Osteoporosis  Provider:  Mannam, Praveen MD  Procedure: Injection  Prolia  (Denosumab ), Dose: 60 mg, Site: subcutaneous, Number of injections: 1  Injection Site(s): Right upper quad. abdomen  Post Care: Patient declined observation  Discharge: Condition: Good, Destination: Home . AVS Declined  Performed by:  Lamarr Pouch RN

## 2023-12-17 ENCOUNTER — Other Ambulatory Visit: Payer: Self-pay | Admitting: Family Medicine

## 2023-12-17 DIAGNOSIS — I10 Essential (primary) hypertension: Secondary | ICD-10-CM

## 2023-12-17 DIAGNOSIS — E7800 Pure hypercholesterolemia, unspecified: Secondary | ICD-10-CM

## 2023-12-31 ENCOUNTER — Ambulatory Visit (INDEPENDENT_AMBULATORY_CARE_PROVIDER_SITE_OTHER): Admitting: Family Medicine

## 2023-12-31 ENCOUNTER — Ambulatory Visit: Payer: Self-pay | Admitting: Family Medicine

## 2023-12-31 ENCOUNTER — Encounter: Payer: Self-pay | Admitting: Family Medicine

## 2023-12-31 VITALS — BP 136/72 | HR 77 | Temp 97.6°F | Ht 65.0 in | Wt 158.6 lb

## 2023-12-31 DIAGNOSIS — R7303 Prediabetes: Secondary | ICD-10-CM

## 2023-12-31 DIAGNOSIS — Z23 Encounter for immunization: Secondary | ICD-10-CM | POA: Diagnosis not present

## 2023-12-31 DIAGNOSIS — I10 Essential (primary) hypertension: Secondary | ICD-10-CM | POA: Diagnosis not present

## 2023-12-31 LAB — BASIC METABOLIC PANEL WITH GFR
BUN: 22 mg/dL (ref 6–23)
CO2: 30 meq/L (ref 19–32)
Calcium: 9.4 mg/dL (ref 8.4–10.5)
Chloride: 99 meq/L (ref 96–112)
Creatinine, Ser: 0.7 mg/dL (ref 0.40–1.20)
GFR: 86.11 mL/min (ref >60.00)
Glucose, Bld: 89 mg/dL (ref 70–99)
Potassium: 3.5 meq/L (ref 3.5–5.1)
Sodium: 138 meq/L (ref 135–145)

## 2023-12-31 LAB — HEMOGLOBIN A1C: Hgb A1c MFr Bld: 5.8 % (ref 4.6–6.5)

## 2023-12-31 MED ORDER — COVID-19 MRNA VAC-TRIS(PFIZER) 30 MCG/0.3ML IM SUSY: 0.3000 mL | PREFILLED_SYRINGE | Freq: Once | INTRAMUSCULAR | 0 refills | Status: AC

## 2023-12-31 NOTE — Progress Notes (Signed)
 Established Patient Office Visit   Subjective:  Patient ID: Michele Hanson, female    DOB: 04-Nov-1950  Age: 73 y.o. MRN: [id number]  Chief Complaint  Patient presents with   Medical Management of Chronic Issues    6 month follow up. Pt is not fasting. Flu shot today.     HPI Encounter Diagnoses  Name Primary?   Prediabetes Yes   Immunization due    Essential hypertension    Ongoing issues with lower back pain and stiffness.  Ongoing follow-up with pain management and is currently establishing with [facility].  Seeing bone specialist for management of osteopenia.  Ongoing follow-up with GYN.  Blood pressure well-controlled recently.  Continues atorvastatin  for elevated cholesterol.   Review of Systems  Constitutional: Negative.   HENT: Negative.    Eyes:  Negative for blurred vision, discharge and redness.  Respiratory: Negative.    Cardiovascular: Negative.   Gastrointestinal:  Negative for abdominal pain.  Genitourinary: Negative.   Musculoskeletal:  Positive for back pain. Negative for myalgias.  Skin:  Negative for rash.  Neurological:  Negative for tingling, loss of consciousness and weakness.  Endo/Heme/Allergies:  Negative for polydipsia.     Current Outpatient Medications:    COVID-19 mRNA vaccine, Pfizer, (COMIRNATY) syringe, Inject 0.3 mLs into the muscle once for 1 dose., Disp: 0.3 mL, Rfl: 0   aspirin  325 MG tablet, Take 650 mg by mouth 2 (two) times daily., Disp: , Rfl:    atorvastatin  (LIPITOR) 20 MG tablet, TAKE 1 TABLET BY MOUTH EVERY DAY, Disp: 90 tablet, Rfl: 3   Azelastine  HCl 137 MCG/SPRAY SOLN, Place 1 spray into both nostrils 2 (two) times daily as needed (allergies)., Disp: , Rfl:    Brimonidine Tartrate (LUMIFY) 0.025 % SOLN, Place 1 drop into both eyes daily., Disp: , Rfl:    Calcium  Carbonate-Simethicone (ALKA-SELTZER HEARTBURN + GAS PO), Take 2 tablets by mouth daily as needed (heartburn)., Disp: , Rfl:    citalopram  (CELEXA ) 20 MG tablet, TAKE  1 TABLET BY MOUTH 1 TIME DAILY, Disp: 90 tablet, Rfl: 1   Cyanocobalamin  (VITAMIN B 12 PO), Take 1 tablet by mouth daily., Disp: , Rfl:    denosumab  (PROLIA ) 60 MG/ML SOSY injection, Inject 60 mg into the skin every 6 (six) months., Disp: , Rfl:    estradiol (ESTRACE) 0.1 MG/GM vaginal cream, Place 1 Applicatorful vaginally daily as needed (dryness)., Disp: , Rfl:    fluticasone  (FLONASE ) 50 MCG/ACT nasal spray, USE 1 SPRAY IN EACH NOSTRIL 1 TIME DAILY, Disp: 48 mL, Rfl: 1   gabapentin  (NEURONTIN ) 400 MG capsule, TAKE 1 CAPSULE BY MOUTH THREE TIMES A DAY, Disp: 90 capsule, Rfl: 2   hydrochlorothiazide  (HYDRODIURIL ) 25 MG tablet, TAKE 1 TABLET (25 MG TOTAL) BY MOUTH DAILY., Disp: 90 tablet, Rfl: 3   loperamide (IMODIUM A-D) 2 MG tablet, Take 2 mg by mouth 4 (four) times daily as needed for diarrhea or loose stools., Disp: , Rfl:    LUTEIN PO, Take 1 tablet by mouth daily., Disp: , Rfl:    Milk Thistle 250 MG CAPS, Take 250 mg by mouth daily., Disp: , Rfl:    Multiple Vitamins-Minerals (ADULT GUMMY PO), Take 2 capsules by mouth daily., Disp: , Rfl:    omeprazole  (PRILOSEC) 20 MG capsule, TAKE 1 CAPSULE BY MOUTH 1 TIME DAILY, Disp: 90 capsule, Rfl: 1   Prasterone, DHEA, (DHEA PO), Take 1 tablet by mouth daily., Disp: , Rfl:    SEMAGLUTIDE-WEIGHT MANAGEMENT [place], Inject 1 Dose into the  skin every 7 (seven) days. (Patient not taking: Reported on 06/30/2023), Disp: , Rfl:    Sennosides (EX-LAX PO), Take 1 tablet by mouth daily as needed (constipation)., Disp: , Rfl:    Objective:     BP 136/72 (BP Location: Left Arm, Patient Position: Sitting, Cuff Size: Normal)   Pulse 77   Temp 97.6 F (36.4 C) (Temporal)   Ht 5' 5 (1.651 m)   Wt 158 lb 9.6 oz (71.9 kg)   SpO2 96%   BMI 26.39 kg/m  BP Readings from Last 3 Encounters:  12/31/23 136/72  11/27/23 134/89  06/30/23 122/68   Wt Readings from Last 3 Encounters:  12/31/23 158 lb 9.6 oz (71.9 kg)  11/27/23 156 lb (70.8 kg)  06/30/23 164 lb 6.4  oz (74.6 kg)      Physical Exam Constitutional:      General: She is not in acute distress.    Appearance: Normal appearance. She is not ill-appearing, toxic-appearing or diaphoretic.  HENT:     Head: Normocephalic and atraumatic.     Right Ear: External ear normal.     Left Ear: External ear normal.     Mouth/Throat:     Mouth: Mucous membranes are moist.     Pharynx: Oropharynx is clear. No oropharyngeal exudate or posterior oropharyngeal erythema.  Eyes:     General: No scleral icterus.       Right eye: No discharge.        Left eye: No discharge.     Extraocular Movements: Extraocular movements intact.     Conjunctiva/sclera: Conjunctivae normal.     Pupils: Pupils are equal, round, and reactive to light.  Cardiovascular:     Rate and Rhythm: Normal rate and regular rhythm.  Pulmonary:     Effort: Pulmonary effort is normal. No respiratory distress.     Breath sounds: Normal breath sounds. No wheezing or rales.  Musculoskeletal:     Cervical back: No rigidity or tenderness.  Skin:    General: Skin is warm and dry.  Neurological:     Mental Status: She is alert and oriented to person, place, and time.  Psychiatric:        Mood and Affect: Mood normal.        Behavior: Behavior normal.      No results found for any visits on 12/31/23.    The 10-year ASCVD risk score (Arnett DK, et al., 2019) is: 16.6%    Assessment & Plan:   Prediabetes -     Basic metabolic panel with GFR -     Hemoglobin A1c  Immunization due -     Flu vaccine HIGH DOSE PF(Fluzone Trivalent) -     COVID-19 mRNA Vac-TriS(Pfizer); Inject 0.3 mLs into the muscle once for 1 dose.  Dispense: 0.3 mL; Refill: 0  Essential hypertension -     Basic metabolic panel with GFR    Return in about 6 months (around 06/29/2024) for chronic disease follow-up, annual physical.  Information given on preventing type 2 diabetes.  Continue HCTZ for hypertension.  Elsie Sim Lent, MD

## 2024-01-23 ENCOUNTER — Other Ambulatory Visit: Payer: Self-pay | Admitting: Family Medicine

## 2024-03-14 ENCOUNTER — Other Ambulatory Visit: Payer: Self-pay | Admitting: Family Medicine

## 2024-03-14 DIAGNOSIS — F418 Other specified anxiety disorders: Secondary | ICD-10-CM

## 2024-03-28 ENCOUNTER — Other Ambulatory Visit: Payer: Self-pay

## 2024-03-28 ENCOUNTER — Emergency Department (HOSPITAL_BASED_OUTPATIENT_CLINIC_OR_DEPARTMENT_OTHER)

## 2024-03-28 ENCOUNTER — Observation Stay (HOSPITAL_BASED_OUTPATIENT_CLINIC_OR_DEPARTMENT_OTHER)
Admission: EM | Admit: 2024-03-28 | Discharge: 2024-03-30 | Disposition: A | Source: Ambulatory Visit | Attending: Internal Medicine | Admitting: Internal Medicine

## 2024-03-28 ENCOUNTER — Encounter (HOSPITAL_BASED_OUTPATIENT_CLINIC_OR_DEPARTMENT_OTHER): Payer: Self-pay | Admitting: Emergency Medicine

## 2024-03-28 DIAGNOSIS — Z87891 Personal history of nicotine dependence: Secondary | ICD-10-CM | POA: Insufficient documentation

## 2024-03-28 DIAGNOSIS — E7800 Pure hypercholesterolemia, unspecified: Secondary | ICD-10-CM | POA: Insufficient documentation

## 2024-03-28 DIAGNOSIS — M810 Age-related osteoporosis without current pathological fracture: Secondary | ICD-10-CM | POA: Diagnosis not present

## 2024-03-28 DIAGNOSIS — M4310 Spondylolisthesis, site unspecified: Secondary | ICD-10-CM | POA: Diagnosis not present

## 2024-03-28 DIAGNOSIS — F419 Anxiety disorder, unspecified: Secondary | ICD-10-CM | POA: Insufficient documentation

## 2024-03-28 DIAGNOSIS — M48062 Spinal stenosis, lumbar region with neurogenic claudication: Secondary | ICD-10-CM | POA: Insufficient documentation

## 2024-03-28 DIAGNOSIS — R519 Headache, unspecified: Secondary | ICD-10-CM | POA: Diagnosis not present

## 2024-03-28 DIAGNOSIS — S2241XA Multiple fractures of ribs, right side, initial encounter for closed fracture: Secondary | ICD-10-CM | POA: Diagnosis present

## 2024-03-28 DIAGNOSIS — F1099 Alcohol use, unspecified with unspecified alcohol-induced disorder: Secondary | ICD-10-CM | POA: Diagnosis not present

## 2024-03-28 DIAGNOSIS — I10 Essential (primary) hypertension: Secondary | ICD-10-CM | POA: Insufficient documentation

## 2024-03-28 DIAGNOSIS — K219 Gastro-esophageal reflux disease without esophagitis: Secondary | ICD-10-CM | POA: Insufficient documentation

## 2024-03-28 DIAGNOSIS — F418 Other specified anxiety disorders: Secondary | ICD-10-CM | POA: Diagnosis present

## 2024-03-28 DIAGNOSIS — D7389 Other diseases of spleen: Secondary | ICD-10-CM

## 2024-03-28 DIAGNOSIS — W19XXXA Unspecified fall, initial encounter: Secondary | ICD-10-CM | POA: Insufficient documentation

## 2024-03-28 DIAGNOSIS — Z79899 Other long term (current) drug therapy: Secondary | ICD-10-CM | POA: Insufficient documentation

## 2024-03-28 DIAGNOSIS — K760 Fatty (change of) liver, not elsewhere classified: Secondary | ICD-10-CM | POA: Insufficient documentation

## 2024-03-28 DIAGNOSIS — Z96611 Presence of right artificial shoulder joint: Secondary | ICD-10-CM | POA: Diagnosis not present

## 2024-03-28 DIAGNOSIS — S2249XA Multiple fractures of ribs, unspecified side, initial encounter for closed fracture: Secondary | ICD-10-CM | POA: Diagnosis present

## 2024-03-28 DIAGNOSIS — Z7982 Long term (current) use of aspirin: Secondary | ICD-10-CM | POA: Insufficient documentation

## 2024-03-28 DIAGNOSIS — F32A Depression, unspecified: Secondary | ICD-10-CM | POA: Insufficient documentation

## 2024-03-28 LAB — CBC
HCT: 40.7 % (ref 36.0–46.0)
Hemoglobin: 13.9 g/dL (ref 12.0–15.0)
MCH: 31 pg (ref 26.0–34.0)
MCHC: 34.2 g/dL (ref 30.0–36.0)
MCV: 90.6 fL (ref 80.0–100.0)
Platelets: 163 K/uL (ref 150–400)
RBC: 4.49 MIL/uL (ref 3.87–5.11)
RDW: 12.1 % (ref 11.5–15.5)
WBC: 7 K/uL (ref 4.0–10.5)
nRBC: 0 % (ref 0.0–0.2)

## 2024-03-28 LAB — COMPREHENSIVE METABOLIC PANEL WITH GFR
ALT: 43 U/L (ref 0–44)
AST: 41 U/L (ref 15–41)
Albumin: 4.6 g/dL (ref 3.5–5.0)
Alkaline Phosphatase: 67 U/L (ref 38–126)
Anion gap: 15 (ref 5–15)
BUN: 20 mg/dL (ref 8–23)
CO2: 25 mmol/L (ref 22–32)
Calcium: 9.6 mg/dL (ref 8.9–10.3)
Chloride: 98 mmol/L (ref 98–111)
Creatinine, Ser: 0.66 mg/dL (ref 0.44–1.00)
GFR, Estimated: >60 mL/min (ref >60)
Glucose, Bld: 109 mg/dL — ABNORMAL HIGH (ref 70–99)
Potassium: 3.5 mmol/L (ref 3.5–5.1)
Sodium: 139 mmol/L (ref 135–145)
Total Bilirubin: 0.6 mg/dL (ref 0.0–1.2)
Total Protein: 7.2 g/dL (ref 6.5–8.1)

## 2024-03-28 LAB — TROPONIN T, HIGH SENSITIVITY: Troponin T High Sensitivity: <15 ng/L (ref 0–19)

## 2024-03-28 MED ORDER — FENTANYL CITRATE (PF) 50 MCG/ML IJ SOSY
50.0000 ug | PREFILLED_SYRINGE | Freq: Once | INTRAMUSCULAR | Status: AC
  Administered 2024-03-28: 22:00:00 50 ug via INTRAVENOUS
  Filled 2024-03-28: qty 1

## 2024-03-28 MED ORDER — LIDOCAINE 5 % EX PTCH
1.0000 | MEDICATED_PATCH | CUTANEOUS | Status: DC
  Administered 2024-03-28: 18:00:00 1 via TRANSDERMAL
  Filled 2024-03-28: qty 1

## 2024-03-28 MED ORDER — FENTANYL CITRATE (PF) 50 MCG/ML IJ SOSY
50.0000 ug | PREFILLED_SYRINGE | Freq: Once | INTRAMUSCULAR | Status: AC
  Administered 2024-03-28: 17:00:00 50 ug via INTRAVENOUS
  Filled 2024-03-28: qty 1

## 2024-03-28 MED ORDER — IOHEXOL 300 MG/ML  SOLN
100.0000 mL | Freq: Once | INTRAMUSCULAR | Status: AC | PRN
  Administered 2024-03-28: 17:00:00 92 mL via INTRAVENOUS

## 2024-03-28 MED ORDER — FENTANYL CITRATE (PF) 50 MCG/ML IJ SOSY
50.0000 ug | PREFILLED_SYRINGE | Freq: Once | INTRAMUSCULAR | Status: AC
  Administered 2024-03-28: 19:00:00 50 ug via INTRAVENOUS
  Filled 2024-03-28: qty 1

## 2024-03-28 NOTE — ED Provider Notes (Cosign Needed)
[facility] Provider Note   CSN: [id number] Arrival date & time: 03/28/24  1423     Patient presents with: Rib Injury   Michele Hanson is a 73 y.o. female with history of hypertension, hyperlipidemia presents emerged from today for evaluation after fall.  Late last night/early this morning, the patient reports that she rolled out of bed instead of landing on her feet.  She reports that she was consuming alcohol last night.  She denies hitting her head and denies any loss of consciousness.  She reports that she has been having right sided rib pain.  Denies any abdominal pain or upper or lower extremity pain.  She denies any other pain.  Does have some worsening pain with movement and with deep breaths.  She denies any weakness or legs or any trouble walking or talking.  Around 2 weeks ago she did tripped on an extension cord and hit her head and has some old bruising there.  She denies any pain with this.  She reports that her tetanus was updated this year.  She was sent from urgent care that showed multiple rib fractures.  HPI     Prior to Admission medications  Medication Sig Start Date End Date Taking? Authorizing Provider  aspirin  325 MG tablet Take 650 mg by mouth 2 (two) times daily. 04/19/19   [provider]  atorvastatin  (LIPITOR) 20 MG tablet TAKE 1 TABLET BY MOUTH EVERY DAY 12/18/23   Berneta Elsie Sayre, MD  Azelastine  HCl 137 MCG/SPRAY SOLN Place 1 spray into both nostrils 2 (two) times daily as needed (allergies).    [provider]  Brimonidine Tartrate (LUMIFY) 0.025 % SOLN Place 1 drop into both eyes daily. 05/18/19   [provider]  Calcium  Carbonate-Simethicone (ALKA-SELTZER HEARTBURN + GAS PO) Take 2 tablets by mouth daily as needed (heartburn).    [provider]  citalopram  (CELEXA ) 20 MG tablet TAKE 1 TABLET BY MOUTH 1 TIME DAILY 03/16/24   Berneta Elsie Sayre, MD  Cyanocobalamin  (VITAMIN B  12 PO) Take 1 tablet by mouth daily.    [provider]  denosumab  (PROLIA ) 60 MG/ML SOSY injection Inject 60 mg into the skin every 6 (six) months.    [provider]  estradiol (ESTRACE) 0.1 MG/GM vaginal cream Place 1 Applicatorful vaginally daily as needed (dryness). 12/06/19   [provider]  fluticasone  (FLONASE ) 50 MCG/ACT nasal spray USE 1 SPRAY IN EACH NOSTRIL 1 TIME DAILY 04/27/23   Berneta Elsie Sayre, MD  gabapentin  (NEURONTIN ) 400 MG capsule TAKE 1 CAPSULE BY MOUTH THREE TIMES A DAY 10/27/23   Berneta Elsie Sayre, MD  hydrochlorothiazide  (HYDRODIURIL ) 25 MG tablet TAKE 1 TABLET (25 MG TOTAL) BY MOUTH DAILY. 12/18/23   Berneta Elsie Sayre, MD  loperamide (IMODIUM A-D) 2 MG tablet Take 2 mg by mouth 4 (four) times daily as needed for diarrhea or loose stools.    [provider]  LUTEIN PO Take 1 tablet by mouth daily.    [provider]  Milk Thistle 250 MG CAPS Take 250 mg by mouth daily.    [provider]  Multiple Vitamins-Minerals (ADULT GUMMY PO) Take 2 capsules by mouth daily.    [provider]  omeprazole  (PRILOSEC) 20 MG capsule TAKE 1 CAPSULE BY MOUTH 1 TIME DAILY 09/18/23   Berneta Elsie Sayre, MD  Sennosides (EX-LAX PO) Take 1 tablet by mouth daily as needed (constipation).    [provider]  Allergies: Ciprofloxacin, Neomycin, and Nickel    Review of Systems  Constitutional:  Negative for chills and fever.  Respiratory:  Negative for shortness of breath.   Cardiovascular:  Positive for chest pain.  Gastrointestinal:  Negative for abdominal pain, nausea and vomiting.  Neurological:  Negative for dizziness, syncope, weakness, light-headedness and headaches.    Updated Vital Signs BP (!) 155/102 (BP Location: Right Arm)   Pulse (!) 108   Temp 97.6 F (36.4 C)   Resp 20   Ht 5' 5 (1.651 m)   Wt 71.7 kg   SpO2 93%   BMI 26.29 kg/m   Physical Exam Vitals and nursing note reviewed.   Constitutional:      General: She is not in acute distress.    Appearance: She is not ill-appearing or toxic-appearing.  HENT:     Head:     Comments: Old bruising noted to the left side of the face, near the orbital area.  Nontender to palpation.  No battle signs.  No step-offs or deformities. Eyes:     General: No scleral icterus. Cardiovascular:     Rate and Rhythm: Normal rate.     Pulses:          Radial pulses are 2+ on the right side and 2+ on the left side.       Dorsalis pedis pulses are 2+ on the left side.       Posterior tibial pulses are 2+ on the right side and 2+ on the left side.  Pulmonary:     Effort: Pulmonary effort is normal. No respiratory distress.     Breath sounds: Normal breath sounds.  Chest:     Chest wall: Tenderness present.       Comments: Bruising noted with superficial abrasion overlying to the right lateral ribs.  Tenderness to palpation.  No flail chest noted. Abdominal:     Palpations: Abdomen is soft.     Tenderness: There is no abdominal tenderness. There is no guarding or rebound.  Musculoskeletal:        General: No tenderness.     Cervical back: No tenderness.     Comments: No tenderness to the bilateral upper and lower extremities.  Sensation reportedly intact/unchanged and symmetric per patient.  Compartments are soft.  Palpable distal pulses.  Skin:    General: Skin is warm and dry.  Neurological:     General: No focal deficit present.     Mental Status: She is alert.     (all labs ordered are listed, but only abnormal results are displayed) Labs Reviewed  COMPREHENSIVE METABOLIC PANEL WITH GFR - Abnormal; Notable for the following components:      Result Value   Glucose, Bld 109 (*)    All other components within normal limits  CBC  TROPONIN T, HIGH SENSITIVITY    EKG: EKG Interpretation Date/Time:  Monday March 28 2024 15:24:42 EST Ventricular Rate:  91 PR Interval:  157 QRS Duration:  92 QT Interval:  401 QTC  Calculation: 494 R Axis:   29  Text Interpretation: Sinus rhythm Low voltage, precordial leads Borderline prolonged QT interval Confirmed by Darra Chew [phone]) on 03/28/2024 3:40:32 PM  Radiology: CT CHEST ABDOMEN PELVIS W CONTRAST Result Date: 03/28/2024 EXAM: CT CHEST, ABDOMEN AND PELVIS WITH CONTRAST 03/28/2024 04:42:00 PM TECHNIQUE: CT of the chest, abdomen and pelvis was performed with the administration of 92 mL of iohexol  (OMNIPAQUE ) 300 MG/ML solution. Multiplanar reformatted images are provided for review. Automated  exposure control, iterative reconstruction, and/or weight based adjustment of the mA/kV was utilized to reduce the radiation dose to as low as reasonably achievable. COMPARISON: Ultrasound abdomen 02/03/2019 and ultrasound pelvis 10/21/2022. CLINICAL HISTORY: Polytrauma, blunt; right sided. [name] out of bed last night with left flank pain. FINDINGS: CHEST: MEDIASTINUM AND LYMPH NODES: Heart and pericardium are unremarkable. The central airways are clear. No mediastinal, hilar or axillary lymphadenopathy. LUNGS AND PLEURA: No focal consolidation or pulmonary edema. No pleural effusion or pneumothorax. ABDOMEN AND PELVIS: LIVER: Mild diffuse fatty infiltration of the liver. No focal liver lesions. GALLBLADDER AND BILE DUCTS: Gallbladder is unremarkable. No biliary ductal dilatation. SPLEEN: Multiple low attenuation lesions are demonstrated throughout the spleen, the largest measuring 1.6 cm in diameter. These may represent hemangiomas, although due to the finding of multiple lesions, lymphoma or metastatic disease could also have this appearance. PANCREAS: No acute abnormality. ADRENAL GLANDS: No acute abnormality. KIDNEYS, URETERS AND BLADDER: No stones in the kidneys or ureters. No hydronephrosis. No perinephric or periureteral stranding. Urinary bladder is unremarkable. GI AND BOWEL: Stomach demonstrates no acute abnormality. Diverticulosis of the sigmoid colon. No evidence of acute  diverticulitis. There is no bowel obstruction. REPRODUCTIVE ORGANS: No acute abnormality. PERITONEUM AND RETROPERITONEUM: No ascites. No free air. VASCULATURE: Aorta is normal in caliber. Calcification of the aorta. No aneurysm. ABDOMINAL AND PELVIS LYMPH NODES: No lymphadenopathy. BONES AND SOFT TISSUES: Acute fractures of the right lateral 11th, 10th, 9th, and 8th ribs with additional old fractures of bilateral ribs. No focal bone lesions. Postoperative changes in the right shoulder. Postoperative fixation of the lower lumbar spine. Degenerative changes in the spine. No acute vertebral compression deformities. The sternum is nondepressed. Pelvis and hips appear intact. Abdominal wall musculature appears intact. IMPRESSION: 1. Acute fractures of the right lateral 11th, 10th, 9th, and 8th ribs, with additional old bilateral rib fractures. 2. Multiple low-attenuation splenic lesions up to 1.6 cm, which could represent hemangiomas; lymphoma or metastatic disease is also possible. Consider short-term follow-up versus PET CT for further evaluation if clinically indicated. Electronically signed by: Elsie Gravely MD 03/28/2024 05:35 PM EST RP Workstation: [id number]   CT Cervical Spine Wo Contrast Result Date: 03/28/2024 EXAM: CT CERVICAL SPINE WITHOUT CONTRAST 03/28/2024 04:40:00 PM TECHNIQUE: CT of the cervical spine was performed without the administration of intravenous contrast. Multiplanar reformatted images are provided for review. Automated exposure control, iterative reconstruction, and/or weight based adjustment of the mA/kV was utilized to reduce the radiation dose to as low as reasonably achievable. COMPARISON: None available. CLINICAL HISTORY: Neck trauma (Age >= 65y) FINDINGS: BONES AND ALIGNMENT: Straightening of the normal cervical lordosis. There is 2 mm degenerative anterolisthesis of C3 on C4 and C4 on C5. Additional trace degenerative retrolisthesis of C5 on C6 and trace degenerative  anterolisthesis of C6 on C7 and C7 on T1. There are no findings to suggest traumatic malalignment. No acute fracture. DEGENERATIVE CHANGES: No high grade osseous spinal canal stenosis. Disc space narrowing at C5-C6 with associated endplate sclerosis and degenerative endplate osteophytes. Facet arthrosis most pronounced on the left from C3-C4 through C6-C7. Uncovertebral hypertrophy greatest at C5-C6. There is foraminal stenosis at multiple levels most pronounced at C5-C6. SOFT TISSUES: No prevertebral soft tissue swelling. IMPRESSION: 1. No evidence of acute traumatic injury. 2. Multilevel degenerative changes as above. Electronically signed by: Donnice Mania MD 03/28/2024 05:19 PM EST RP Workstation: [id number]   CT Head Wo Contrast Result Date: 03/28/2024 EXAM: CT HEAD WITHOUT 03/28/2024 04:40:00 PM TECHNIQUE: CT of the head was  performed without the administration of intravenous contrast. Automated exposure control, iterative reconstruction, and/or weight based adjustment of the mA/kV was utilized to reduce the radiation dose to as low as reasonably achievable. COMPARISON: None available. CLINICAL HISTORY: Head trauma, minor (Age >= 65y) FINDINGS: BRAIN AND VENTRICLES: No acute intracranial hemorrhage. No mass effect or midline shift. No extra-axial fluid collection. No evidence of acute infarct. No hydrocephalus. Mild cerebral volume loss. Mild chronic microvascular ischemic changes. Mild calcific atheromatous disease in carotid siphons. ORBITS: Left periorbital soft tissue contusion. SINUSES AND MASTOIDS: No acute abnormality. SOFT TISSUES AND SKULL: No acute skull fracture. IMPRESSION: 1. No acute intracranial abnormality. 2. Left periorbital soft tissue contusion. Electronically signed by: Donnice Mania MD 03/28/2024 05:13 PM EST RP Workstation: [id number]   Procedures   Medications Ordered in the ED  iohexol  (OMNIPAQUE ) 300 MG/ML solution 100 mL (92 mLs Intravenous Contrast Given 03/28/24 1637)   fentaNYL  (SUBLIMAZE ) injection 50 mcg (50 mcg Intravenous Given 03/28/24 1640)    Medical Decision Making Amount and/or Complexity of Data Reviewed Labs: ordered. Radiology: ordered.  Risk Prescription drug management. Decision regarding hospitalization.   73 y.o. female presents to the ER for evaluation of fall with right-sided rib pain. Differential diagnosis includes but is not limited to trauma, rib fracture, soft tissue/organ damage. Vital signs elevated blood pressure 121/103, oxygen 93% however patient is speaking in full sentences.  Otherwise unremarkable.SABRA Physical exam as noted above.   Pain medication was given.  Given patient's advanced age as well as bruising seen to the upper abdomen and lower chest, will obtain chest abdomen pelvis.  She is not having any frank chest pain however will order troponin for rule out for cardiac contusion.  Her fall was greater than 2 weeks ago however again given patient's age and questionable historian, will obtain CT head and neck as well.  I independently reviewed and interpreted the patient's labs.  CBC without leukocytosis or anemia.  CMP shows Leukos at 109 without any electrolyte or LFT abnormality.  Troponin less than 15.  CT head . No acute intracranial abnormality. 2. Left periorbital soft tissue contusion. Per radiologist's interpretation.    CT cervical spine 1. No evidence of acute traumatic injury. 2. Multilevel degenerative changes as above.  Per radiologist's interpretation.    CT CAP 1. Acute fractures of the right lateral 11th, 10th, 9th, and 8th ribs, with additional old bilateral rib fractures. 2. Multiple low-attenuation splenic lesions up to 1.6 cm, which could represent hemangiomas; lymphoma or metastatic disease is also possible. Consider short-term follow-up versus PET CT for further evaluation if clinically indicated.  Per radiologist's interpretation.    I discussed CT findings with the patient including the incidental  finding of the splenic lesions.  Recommended she follow-up with her primary care provider for further evaluation of these.  Given patient's age with multiple rib fractures as well as oxygen saturation between 93 and 95%, do feel patient is at high risk.  Will admit for hospitalization for aggressive pulmonary toileting and pain.  Discussed with [facility] hospitalist who will admit.  Portions of this report may have been transcribed using voice recognition software. Every effort was made to ensure accuracy; however, inadvertent computerized transcription errors may be present.    Final diagnoses:  Fall, initial encounter  Closed fracture of multiple ribs of right side, initial encounter  Splenic lesion    ED Discharge Orders     None          Bernis Ernst, [place] 03/28/24  2027 ° °

## 2024-03-28 NOTE — ED Triage Notes (Signed)
 Pt from [facility]- reports falling out of bed last night,   Has steristrip to L eyebrow, had Dg of L ribs, + fracture of 9,10.11th ribs.    Denies shob. C/o pain to L flank. Denies blood thinners.

## 2024-03-28 NOTE — Progress Notes (Signed)
 Call from [facility].   Patient is a 73 year old female with history of hypertension, alcohol abuse, hepatitis C, depression anxiety.  Patient was attempting to get out of bed and instead rolled out of bed, falling on the floor on her right side.   CT AP W C- shows multiple rib fractures-8th, 9th, 10th and 11th.  Also shows splenic lesions that will need to be followed-up as outpatient. O2 sats 93 to 95% on room air. Will place temporary obs admission orders, telemetry.  Tully FORBES Carwin MD [facility]  03/28/2024 7:06 PM  LOS- No Charge.

## 2024-03-29 ENCOUNTER — Encounter (HOSPITAL_COMMUNITY): Payer: Self-pay | Admitting: Internal Medicine

## 2024-03-29 DIAGNOSIS — R9389 Abnormal findings on diagnostic imaging of other specified body structures: Secondary | ICD-10-CM | POA: Insufficient documentation

## 2024-03-29 MED ORDER — ONDANSETRON HCL 4 MG PO TABS: 4.0000 mg | ORAL_TABLET | Freq: Four times a day (QID) | ORAL | Status: DC | PRN

## 2024-03-29 MED ORDER — PANTOPRAZOLE SODIUM 40 MG PO TBEC: 40.0000 mg | DELAYED_RELEASE_TABLET | Freq: Every day | ORAL | Status: DC | PRN

## 2024-03-29 MED ORDER — ACETAMINOPHEN 325 MG PO TABS
650.0000 mg | ORAL_TABLET | Freq: Four times a day (QID) | ORAL | Status: DC | PRN
  Administered 2024-03-30: 16:00:00 650 mg via ORAL
  Filled 2024-03-29: qty 2

## 2024-03-29 MED ORDER — HYDROCHLOROTHIAZIDE 25 MG PO TABS
25.0000 mg | ORAL_TABLET | Freq: Every day | ORAL | Status: DC
  Administered 2024-03-29 – 2024-03-30 (×2): 25 mg via ORAL
  Filled 2024-03-29 (×2): qty 1

## 2024-03-29 MED ORDER — ACETAMINOPHEN 650 MG RE SUPP: 650.0000 mg | Freq: Four times a day (QID) | RECTAL | Status: DC | PRN

## 2024-03-29 MED ORDER — MORPHINE SULFATE (PF) 4 MG/ML IV SOLN
4.0000 mg | Freq: Once | INTRAVENOUS | Status: AC
  Administered 2024-03-29: 08:00:00 4 mg via INTRAVENOUS
  Filled 2024-03-29: qty 1

## 2024-03-29 MED ORDER — ENOXAPARIN SODIUM 40 MG/0.4ML IJ SOSY
40.0000 mg | PREFILLED_SYRINGE | INTRAMUSCULAR | Status: DC
  Administered 2024-03-29: 21:00:00 40 mg via SUBCUTANEOUS
  Filled 2024-03-29: qty 0.4

## 2024-03-29 MED ORDER — LIDOCAINE 5 % EX PTCH
1.0000 | MEDICATED_PATCH | Freq: Every day | CUTANEOUS | Status: DC
  Administered 2024-03-29 – 2024-03-30 (×2): 1 via TRANSDERMAL
  Filled 2024-03-29 (×2): qty 1

## 2024-03-29 MED ORDER — FENTANYL CITRATE (PF) 50 MCG/ML IJ SOSY
50.0000 ug | PREFILLED_SYRINGE | Freq: Once | INTRAMUSCULAR | Status: AC
  Administered 2024-03-29: 05:00:00 50 ug via INTRAVENOUS
  Filled 2024-03-29: qty 1

## 2024-03-29 MED ORDER — ONDANSETRON HCL 4 MG/2ML IJ SOLN
4.0000 mg | Freq: Once | INTRAMUSCULAR | Status: AC
  Administered 2024-03-29: 08:00:00 4 mg via INTRAVENOUS
  Filled 2024-03-29: qty 2

## 2024-03-29 MED ORDER — MORPHINE SULFATE (PF) 2 MG/ML IV SOLN: 2.0000 mg | INTRAVENOUS | Status: DC | PRN

## 2024-03-29 MED ORDER — OXYCODONE HCL 5 MG PO TABS
5.0000 mg | ORAL_TABLET | Freq: Four times a day (QID) | ORAL | Status: DC | PRN
  Administered 2024-03-29 – 2024-03-30 (×3): 5 mg via ORAL
  Filled 2024-03-29 (×3): qty 1

## 2024-03-29 MED ORDER — GABAPENTIN 400 MG PO CAPS
400.0000 mg | ORAL_CAPSULE | Freq: Two times a day (BID) | ORAL | Status: DC
  Administered 2024-03-29 – 2024-03-30 (×2): 400 mg via ORAL
  Filled 2024-03-29 (×2): qty 1

## 2024-03-29 MED ORDER — MORPHINE SULFATE (PF) 4 MG/ML IV SOLN
4.0000 mg | Freq: Once | INTRAVENOUS | Status: AC
  Administered 2024-03-29: 11:00:00 4 mg via INTRAVENOUS
  Filled 2024-03-29: qty 1

## 2024-03-29 MED ORDER — CITALOPRAM HYDROBROMIDE 20 MG PO TABS
20.0000 mg | ORAL_TABLET | Freq: Every day | ORAL | Status: DC
  Administered 2024-03-29 – 2024-03-30 (×2): 20 mg via ORAL
  Filled 2024-03-29 (×2): qty 1

## 2024-03-29 MED ORDER — ATORVASTATIN CALCIUM 20 MG PO TABS
20.0000 mg | ORAL_TABLET | Freq: Every day | ORAL | Status: DC
  Administered 2024-03-29 – 2024-03-30 (×2): 20 mg via ORAL
  Filled 2024-03-29 (×2): qty 1

## 2024-03-29 MED ORDER — FENTANYL CITRATE (PF) 50 MCG/ML IJ SOSY
50.0000 ug | PREFILLED_SYRINGE | Freq: Once | INTRAMUSCULAR | Status: AC
  Administered 2024-03-29: 01:00:00 50 ug via INTRAVENOUS
  Filled 2024-03-29: qty 1

## 2024-03-29 MED ORDER — ONDANSETRON HCL 4 MG/2ML IJ SOLN: 4.0000 mg | Freq: Four times a day (QID) | INTRAMUSCULAR | Status: DC | PRN

## 2024-03-29 MED ORDER — KETOROLAC TROMETHAMINE 15 MG/ML IJ SOLN
15.0000 mg | Freq: Four times a day (QID) | INTRAMUSCULAR | Status: AC
  Administered 2024-03-29 – 2024-03-30 (×3): 15 mg via INTRAVENOUS
  Filled 2024-03-29 (×3): qty 1

## 2024-03-29 MED ORDER — GABAPENTIN 400 MG PO CAPS
400.0000 mg | ORAL_CAPSULE | Freq: Once | ORAL | Status: AC
  Administered 2024-03-29: 17:00:00 400 mg via ORAL
  Filled 2024-03-29: qty 1

## 2024-03-29 NOTE — ED Notes (Signed)
 Patient instructed with IS per patient request. Good effort, showed her how to splint due to pain. RT to monitor as needed

## 2024-03-29 NOTE — H&P (Signed)
 History and Physical    Patient: Michele Hanson FMW:[id number] DOB: 03-Feb-1951 DOA: 03/28/2024 DOS: the patient was seen and examined on 03/29/2024 PCP: Berneta Elsie Sayre, MD  Patient coming from: Home  Chief Complaint:  Chief Complaint  Patient presents with   Rib Injury   HPI: Michele Hanson is a 73 y.o. 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 He denied fever, chills, rhinorrhea, sore throat, wheezing or hemoptysis.  No chest pain, palpitations, diaphoresis, PND, orthopnea or pitting edema of the lower extremities.  No abdominal pain, nausea, emesis, diarrhea, constipation, melena or hematochezia.  No flank pain, dysuria, frequency or hematuria.  No polyuria, polydipsia, polyphagia or blurred vision.   Lab work: CBC and troponin x 1 was normal.  CMP showed a glucose of 109 mg/dL, but was otherwise normal.  Imaging: CT head without contrast no acute intracranial abnormality.  Left periorbital soft tissue contusion.  CT cervical spine no evidence of acute traumatic injury.  Multilevel degenerative changes as above.  CT chest showing acute fractures of the right lateral 11th, 10th, ninth and 8 ribs, with additional old bilateral rib fractures.  Multiple low-attenuation splenic lesions up to 1.6 cm, which could represent hemangiomas lymphoma or metastatic disease is also possible.  Consider short-term follow-up versus PET/CT for further evaluation if clinically indicated.   ED course: Initial vital signs were  temperature 97.6 F, pulse 78, respirations 20, BP 155/102 mmHg and O2 sat 93% on room air. The patient received fentanyl  50 mcg IVP x 3, a Lidoderm  5% patch, morphine  4 mg IVP and ondansetron  4 mg IVP.  Review of Systems: As mentioned in the history of present illness. All other systems reviewed and are negative. Past Medical History:  Diagnosis Date   Anxiety    Arthritis    Depression    Fatty liver disease, nonalcoholic    GERD (gastroesophageal reflux disease)    Headache    Hepatitis 1973   hep c-was treated   Hyperlipemia    Hypertension    Neuromuscular disorder (HCC)    Past Surgical History:  Procedure Laterality Date   BACK SURGERY     CARPAL TUNNEL RELEASE Bilateral    CHOLECYSTECTOMY     DIAGNOSTIC LAPAROSCOPY     FEMORAL HERNIA REPAIR Left 03/06/2023   Procedure: ROBOTIC REPAIR LEFT INGUINAL HERNIA WITH MESH;  Surgeon: Tanda Locus, MD;  Location: [facility];  Service: General;  Laterality: Left;   INCONTINENCE SURGERY     LAPAROSCOPIC LYSIS OF ADHESIONS     REVERSE SHOULDER ARTHROPLASTY Right 03/20/2021   Procedure: REVERSE SHOULDER ARTHROPLASTY;  Surgeon: Cristy Bonner DASEN, MD;  Location: [facility];  Service: Orthopedics;  Laterality: Right;   Social History:  reports that she has quit smoking. Her smoking use included cigarettes. She has never used smokeless tobacco. She reports current alcohol use. She reports that she does not use drugs.  Allergies[1]  Family History  Family history unknown: Yes    Prior to Admission medications  Medication Sig Start Date End Date Taking? Authorizing Provider  aspirin  325 MG tablet Take 650 mg by mouth 2 (two) times daily. 04/19/19   [provider]  atorvastatin  (LIPITOR) 20 MG tablet TAKE 1 TABLET BY MOUTH EVERY DAY 12/18/23   Berneta Elsie Sayre, MD  Azelastine  HCl 137 MCG/SPRAY SOLN Place 1 spray into both nostrils 2 (two) times daily as needed (allergies).    [provider]  Brimonidine  Tartrate (LUMIFY) 0.025 % SOLN Place 1 drop into both eyes daily. 05/18/19   [provider]  Calcium  Carbonate-Simethicone (ALKA-SELTZER HEARTBURN + GAS PO) Take 2 tablets by mouth daily as needed (heartburn).    [provider]  citalopram  (CELEXA ) 20 MG tablet TAKE 1 TABLET BY MOUTH 1 TIME DAILY 03/16/24   Berneta Elsie Sayre, MD  Cyanocobalamin  (VITAMIN B 12 PO) Take 1 tablet by mouth daily.    [provider]  denosumab  (PROLIA ) 60 MG/ML SOSY injection Inject 60 mg into the skin every 6 (six) months.    [provider]  estradiol (ESTRACE) 0.1 MG/GM vaginal cream Place 1 Applicatorful vaginally daily as needed (dryness). 12/06/19   [provider]  fluticasone  (FLONASE ) 50 MCG/ACT nasal spray USE 1 SPRAY IN EACH NOSTRIL 1 TIME DAILY 04/27/23   Berneta Elsie Sayre, MD  gabapentin  (NEURONTIN ) 400 MG capsule TAKE 1 CAPSULE BY MOUTH THREE TIMES A DAY 10/27/23   Berneta Elsie Sayre, MD  hydrochlorothiazide  (HYDRODIURIL ) 25 MG tablet TAKE 1 TABLET (25 MG TOTAL) BY MOUTH DAILY. 12/18/23   Berneta Elsie Sayre, MD  loperamide (IMODIUM A-D) 2 MG tablet Take 2 mg by mouth 4 (four) times daily as needed for diarrhea or loose stools.    [provider]  LUTEIN PO Take 1 tablet by mouth daily.    [provider]  Milk Thistle 250 MG CAPS Take 250 mg by mouth daily.    [provider]  Multiple Vitamins-Minerals (ADULT GUMMY PO) Take 2 capsules by mouth daily.    [provider]  omeprazole  (PRILOSEC) 20 MG capsule TAKE 1 CAPSULE BY MOUTH 1 TIME DAILY 09/18/23   Berneta Elsie Sayre, MD  Sennosides (EX-LAX PO) Take 1 tablet by mouth daily as needed (constipation).    [provider]    Physical Exam: Vitals:   03/29/24 1056 03/29/24 1100 03/29/24 1105 03/29/24 1338  BP:  (!) 143/88  (!) 132/91  Pulse: 88 85 85 96  Resp: 16 16    Temp:  98.3 F (36.8 C)    TempSrc:      SpO2: 95% 93% 93% 92%  Weight:       Height:       Physical Exam Vitals and nursing note reviewed.  Constitutional:      Appearance: Normal appearance. She is ill-appearing.  HENT:     Head: Normocephalic.     Nose: No rhinorrhea.     Mouth/Throat:     Mouth: Mucous membranes are moist.  Eyes:     General: No scleral icterus.    Pupils: Pupils are equal, round, and reactive to light.  Cardiovascular:     Rate and Rhythm: Normal rate and regular rhythm.  Pulmonary:     Effort: Pulmonary effort is normal.     Breath sounds: Normal breath sounds. No wheezing, rhonchi or rales.  Chest:     Chest wall: Tenderness present.  Abdominal:     General: Bowel sounds are normal. There is no distension.     Palpations: Abdomen is soft.     Tenderness: There is no abdominal tenderness. There is no right CVA tenderness or left CVA tenderness.  Musculoskeletal:  Cervical back: Neck supple.     Right lower leg: No edema.     Left lower leg: No edema.  Skin:    General: Skin is warm and dry.     Findings: Bruising present.  Neurological:     General: No focal deficit present.     Mental Status: She is alert and oriented to person, place, and time.  Psychiatric:        Mood and Affect: Mood normal.        Behavior: Behavior normal.     Data Reviewed:  Results are pending, will review when available.  EKG: Vent. rate 91 BPM  PR interval 157 ms  QRS duration 92 ms  QT/QTcB 401/494 ms  P-R-T axes 59 29 5  Sinus rhythm  Low voltage, precordial leads  Borderline prolonged QT interva  Assessment and Plan: Principal Problem:   Multiple rib fractures Observation/transfer to MedSurg. Analgesics as needed. Antiemetics as needed. Continue Lidoderm  patches. Incentive spirometry.  Active Problems:   OP (osteoporosis) Continue Prolia  injections. Already taking calcium  and vitamin D .    Spondylolisthesis   Spinal stenosis of lumbar  region with neurogenic claudication Analgesics as needed.    Elevated  cholesterol Continue atorvastatin  20 mg p.o. daily.    Essential hypertension Continue HCTZ 25 mg p.o. daily.    Depression with anxiety Continue escitalopram 20 mg p.o. daily.    Hepatic steatosis Monitor LFTs.    GERD (gastroesophageal reflux disease) Antiacid, H2 blocker or PPI as needed.     Advance Care Planning:   Code Status: Full Code   Consults:   Family Communication:   Severity of Illness: The appropriate patient status for this patient is OBSERVATION. Observation status is judged to be reasonable and necessary in order to provide the required intensity of service to ensure the patient's safety. The patient's presenting symptoms, physical exam findings, and initial radiographic and laboratory data in the context of their medical condition is felt to place them at decreased risk for further clinical deterioration. Furthermore, it is anticipated that the patient will be medically stable for discharge from the hospital within 2 midnights of admission.   Author: Alm Dorn Castor, MD 03/29/2024 2:49 PM  For on call review [email or web address].   This document was prepared using Dragon voice recognition software and may contain some unintended transcription errors.      [1]  Allergies Allergen Reactions   Ciprofloxacin Other (See Comments)    Neuropathy in feet   Neomycin Itching    Eye drops   Nickel Itching and Rash

## 2024-03-30 DIAGNOSIS — S2241XA Multiple fractures of ribs, right side, initial encounter for closed fracture: Secondary | ICD-10-CM | POA: Diagnosis not present

## 2024-03-30 DIAGNOSIS — D7389 Other diseases of spleen: Secondary | ICD-10-CM | POA: Diagnosis present

## 2024-03-30 MED ORDER — OXYCODONE-ACETAMINOPHEN 5-325 MG PO TABS: 1.0000 | ORAL_TABLET | ORAL | 0 refills | Status: DC | PRN

## 2024-03-30 MED ORDER — LIDOCAINE 5 % EX PTCH: 1.0000 | MEDICATED_PATCH | Freq: Every day | CUTANEOUS | 0 refills | Status: DC

## 2024-03-30 MED ORDER — ASPIRIN 325 MG PO TABS: 325.0000 mg | ORAL_TABLET | Freq: Every day | ORAL | Status: AC | PRN

## 2024-03-30 NOTE — Assessment & Plan Note (Deleted)
 Continue escitalopram

## 2024-03-30 NOTE — Hospital Course (Signed)
 73yo with h/o anxiety/depression, Hep C with fatty liver, HTN, HLD, and chronic back pain who presented on 12/15 with R chest wall pain after a fall.  She was found to have acute R lateral 8, 9, 10, and 11 rib fractures.  Treated with opiates and lidocaine  patch.

## 2024-03-30 NOTE — Assessment & Plan Note (Deleted)
 Continue atorvastatin 

## 2024-03-30 NOTE — Assessment & Plan Note (Deleted)
 Analgesics as needed

## 2024-03-30 NOTE — Plan of Care (Signed)
  Problem: Clinical Measurements: Goal: Will remain free from infection Outcome: Progressing   Problem: Activity: Goal: Risk for activity intolerance will decrease Outcome: Progressing   Problem: Elimination: Goal: Will not experience complications related to bowel motility Outcome: Progressing Goal: Will not experience complications related to urinary retention Outcome: Progressing   Problem: Pain Managment: Goal: General experience of comfort will improve and/or be controlled Outcome: Progressing   Problem: Safety: Goal: Ability to remain free from injury will improve Outcome: Progressing   Problem: Skin Integrity: Goal: Risk for impaired skin integrity will decrease Outcome: Progressing

## 2024-03-30 NOTE — Care Management Obs Status (Signed)
 MEDICARE OBSERVATION STATUS NOTIFICATION   Patient Details  Name: Michele Hanson MRN: [id number] Date of Birth: [birth year]   Medicare Observation Status Notification Given: Observation notice not given. Case manager at bedside to present notification and patient has been discharged.       Toy LITTIE Agar, RN 03/30/2024, 4:02 PM

## 2024-03-30 NOTE — Assessment & Plan Note (Deleted)
 Continue HCTZ

## 2024-03-30 NOTE — Evaluation (Signed)
 Physical Therapy Evaluation Patient Details Name: Michele Hanson MRN: [id number] DOB: 04/21/1950 Today's Date: 03/30/2024  History of Present Illness  Michele Hanson is a 73 y.o. female presents after a fall. CT CAP: Acute fractures of the right lateral 11th, 10th, 9th, and 8th ribs, with additional old bilateral rib fractures. CT head: No acute intracranial abnormality. 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 PTA, pt reports ind with household and community amb without AD, ind with self care, recently spouse has taken up the cooking due to chronic back pain, drives, pt reports back decompression surgery scheduled for Jan 2026 with Dr. Joshua, endorses multiple falls but does not quantify. On eval, pt with numbness in bil feet and AROM WFL throughout BLE. Pt needing supv with transfers and CGA for ambulation. Pt declines AD use initially but agreeable with education and encouragement. Pt amb 500 ft with RW, intermittent narrow BOS and crouched gait pattern, forward flexed trunk from the hips, difficulty uprighting posture, adequate bil foot clearance, reports this is normal gait pattern due to spinal stenosis. Recommend HHPT and continued spouse support at home and pt verbalizes agreement. Reccommended RW but pt reports wants to trial spouses RW and work with HHPT to potentially not need device. Pt currently with functional limitations due to the deficits listed below (see PT Problem List). Pt will benefit from acute skilled PT to increase their independence and safety with mobility to allow discharge.           If plan is discharge home, recommend the following:  Assistance with cooking/housework;Assist for transportation;Help with stairs or ramp for entrance   Can travel by private vehicle        Equipment Recommendations None recommended by PT (pt wants to use spouse's)  Recommendations for Other Services       Functional Status Assessment Patient has had a recent decline in their functional status and demonstrates the ability to make significant improvements in function in a reasonable and predictable amount of time.     Precautions / Restrictions Precautions Precautions: Fall Precaution/Restrictions Comments: Acute fractures of the right lateral 11th, 10th, 9th, and 8th ribs Restrictions Weight Bearing Restrictions Per Provider Order: No      Mobility  Bed Mobility Overal bed mobility: Modified Independent             General bed mobility comments: slight increased time, no assist or cues    Transfers Overall transfer level: Needs assistance Equipment used: None Transfers: Sit to/from Stand Sit to Stand: Supervision           General transfer comment: pt completes transfers from bedside and toilet, supv for safety, slow to rise, maintains forward flexed trunk from the hips reporting more comfortable position due to lumbar stenosis    Ambulation/Gait Ambulation/Gait assistance: Contact guard assist Gait Distance (Feet): 500 Feet Assistive device: None, Rolling walker (2 wheels) Gait Pattern/deviations: Step-through pattern, Decreased stride length, Trunk flexed Gait velocity: decreased but functional     General Gait Details: step through gait pattern with forward flexed trunk from the hips, intermittent crouched posture with bil knee flexion, intermittent narrow BOS, decreased but functional cadence, pt holding to furniture/handrail without AD but agreeable to RW with improved safety  Stairs  Wheelchair Mobility     Tilt Bed    Modified Rankin (Stroke Patients Only)       Balance Overall  balance assessment: Mild deficits observed, not formally tested, History of Falls                                           Pertinent Vitals/Pain Pain Assessment Pain Assessment: Faces Faces Pain Scale: Hurts a little bit Pain Location: back, rib fx site Pain Descriptors / Indicators: Grimacing, Discomfort, Sore Pain Intervention(s): Limited activity within patient's tolerance, Monitored during session, Repositioned    Home Living Family/patient expects to be discharged to:: Private residence Living Arrangements: Spouse/significant other Available Help at Discharge: Family Type of Home: House Home Access: Stairs to enter   Secretary/administrator of Steps: 2-3 Alternate Level Stairs-Number of Steps: flight Home Layout: Two level;Able to live on main level with bedroom/bathroom Home Equipment: Shower seat;Rolling Walker (2 wheels);Hand held shower head Additional Comments: pt reports spouse has 2 RW she can borrow if needed    Prior Function Prior Level of Function : Independent/Modified Independent;Driving;History of Falls (last six months)             Mobility Comments: pt reports ind with ambulation for household and community distances, pt reports chronic back pain and has decompression surgery scheduled for January 2026, pt endorses falls but does not quantify ADLs Comments: pt reports ind with self care, has cleaning lady, spouse now completing the cooking due to worsening back pain     Extremity/Trunk Assessment   Upper Extremity Assessment Upper Extremity Assessment: Overall WFL for tasks assessed;Right hand dominant (hx of R reverse TSA)    Lower Extremity Assessment Lower Extremity Assessment: Defer to PT evaluation RLE Sensation:  (reports numbness in bil feet) LLE Sensation:  (reports numbness in bil feet)    Cervical / Trunk Assessment Cervical / Trunk Assessment: Kyphotic;Other exceptions (plans for decompression surgery in January)   Communication   Communication Communication: No apparent difficulties    Cognition Arousal: Alert Behavior During Therapy: WFL for tasks assessed/performed   PT - Cognitive impairments: No apparent impairments                         Following commands: Intact       Cueing       General Comments      Exercises     Assessment/Plan    PT Assessment Patient needs continued PT services  PT Problem List Decreased activity tolerance;Decreased balance;Decreased strength;Decreased safety awareness;Decreased knowledge of use of DME;Pain;Impaired sensation       PT Treatment Interventions DME instruction;Gait training;Stair training;Functional mobility training;Therapeutic activities;Therapeutic exercise;Balance training;Neuromuscular re-education;Patient/family education    PT Goals (Current goals can be found in the Care Plan section)  Acute Rehab PT Goals Patient Stated Goal: open to HHPT for balance PT Goal Formulation: With patient Time For Goal Achievement: 04/13/24 Potential to Achieve Goals: Good    Frequency Min 2X/week     Co-evaluation               AM-PAC PT 6 Clicks Mobility  Outcome Measure Help needed turning from your back to your side while in a flat bed without using bedrails?: None Help needed moving from lying on your back to sitting on the side of a flat bed without using bedrails?: None Help needed moving  to and from a bed to a chair (including a wheelchair)?: A Little Help needed standing up from a chair using your arms (e.g., wheelchair or bedside chair)?: A Little Help needed to walk in hospital room?: A Little Help needed climbing 3-5 steps with a railing? : A Little 6 Click Score: 20    End of Session Equipment Utilized During Treatment: Gait belt Activity Tolerance: Patient tolerated treatment well Patient left: in chair;with call bell/phone within reach Nurse Communication: Mobility status PT Visit Diagnosis: Other  abnormalities of gait and mobility (R26.89);Muscle weakness (generalized) (M62.81)    Time: 8858-8795 PT Time Calculation (min) (ACUTE ONLY): 23 min   Charges:   PT Evaluation $PT Eval Moderate Complexity: 1 Mod   PT General Charges $$ ACUTE PT VISIT: 1 Visit         Tori Lillyrose Reitan PT, DPT 03/30/2024, 12:26 PM

## 2024-03-30 NOTE — Evaluation (Signed)
 Occupational Therapy Evaluation Patient Details Name: Michele Hanson MRN: [id number] DOB: March 03, 1951 Today's Date: 03/30/2024   History of Present Illness   Michele Hanson is a 73 y.o. female presents after a fall. CT CAP: Acute fractures of the right lateral 11th, 10th, 9th, and 8th ribs, with additional old bilateral rib fractures. CT head: No acute intracranial abnormality. 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 Pt reports 3 falls in last 3 months as listed below. Pt plans to decompressive back surgery in Jan with Dr Joshua. Educated pt on strategies to increase independence with ADL, decrease pain with mobility and reduce risk of falls. SpO2 95 on RA. Educated pt on use of incentive spirometer and 360 breath pattern - pt able to return demonstrate. Pt states husband can assist as needed after DC. No further OT needed. OT signing off.      If plan is discharge home, recommend the following:   Assistance with cooking/housework     Functional Status Assessment   Patient has had a recent decline in their functional status and demonstrates the ability to make significant improvements in function in a reasonable and predictable amount of time.     Equipment Recommendations   None recommended by OT     Recommendations for Other Services         Precautions/Restrictions   Precautions Precautions: Fall Precaution/Restrictions Comments: Acute fractures of the right lateral 11th, 10th, 9th, and 8th ribs Restrictions Weight Bearing Restrictions Per Provider Order: No     Mobility Bed Mobility               General  bed mobility comments: OOB in chair; encouraged pt to exit be on L to reduce rib pain    Transfers Overall transfer level: Modified independent                 General transfer comment: rec use of RW      Balance Overall balance assessment: Mild deficits observed, not formally tested, History of Falls (pt has tripped over her dog, over an extension cord adn fell out of bed; Discussed working on balance strategies after her back surgery)                                         ADL either performed or assessed with clinical judgement   ADL Overall ADL's : Needs assistance/impaired                                       General ADL Comments: Educated pt on use of figure four positioning for LB ADL tasks. Ablet o complee tasks although somewhat painful duet o rib fxs. Educated on use of long handled songe and reacher to assist with ADL tasks. Recommend use of 3in1 she has at home as a shower chair. Answered quesitons regarding slef care adn mobility for ADL. Educated on strategies to reduce risk of falls     Vision Baseline Vision/History: 1 Wears glasses;4 Cataracts;6 Macular Degeneration (beginning of MD) Ability to See in Adequate Light: 0 Adequate Vision  Assessment?: Wears glasses for reading     Perception         Praxis         Pertinent Vitals/Pain Pain Assessment Faces Pain Scale: Hurts a little bit Pain Location: back, rib fx site Pain Descriptors / Indicators: Grimacing, Discomfort, Sore     Extremity/Trunk Assessment Upper Extremity Assessment Upper Extremity Assessment: Overall WFL for tasks assessed (hx of R TSA - reverse)   Lower Extremity Assessment Lower Extremity Assessment: Defer to PT evaluation RLE Sensation:  (reports numbness in bil feet) LLE Sensation:  (reports numbness in bil feet)   Cervical / Trunk Assessment Cervical / Trunk Assessment: Kyphotic;Other exceptions (plans for decompression surgery in  January)   Communication Communication Communication: No apparent difficulties   Cognition Arousal: Alert Behavior During Therapy: WFL for tasks assessed/performed Cognition: No apparent impairments                               Following commands: Intact       Cueing  General Comments          Exercises Exercises: Other exercises Other Exercises Other Exercises: incentive spirometer x 10 - able to pull over 1500 ml Other Exercises: 360 breath pattern   Shoulder Instructions      Home Living Family/patient expects to be discharged to:: Private residence Living Arrangements: Spouse/significant other Available Help at Discharge: Family Type of Home: House Home Access: Stairs to enter [employer] of Steps: 2-3   Home Layout: Two level;Able to live on main level with bedroom/bathroom Alternate Level Stairs-Number of Steps: flight   Bathroom Shower/Tub: Producer, Television/film/video: Handicapped height Bathroom Accessibility: Yes How Accessible: Accessible via walker Home Equipment: Pharmacist, Hospital (2 wheels);Hand held shower head   Additional Comments: pt reports spouse has 2 RW she can borrow if needed      Prior Functioning/Environment Prior Level of Function : Independent/Modified Independent;Driving;History of Falls (last six months)             Mobility Comments: pt reports ind with ambulation for household and community distances, pt reports chronic back pain and has decompression surgery scheduled for January 2026, pt endorses falls but does not quantify ADLs Comments: pt reports ind with self care, has cleaning lady, spouse now completing the cooking due to worsening back pain    OT Problem List: Impaired balance (sitting and/or standing);Decreased knowledge of precautions;Decreased knowledge of use of DME or AE;Pain   OT Treatment/Interventions:        OT Goals(Current goals can be found in the care plan  section)   Acute Rehab OT Goals Patient Stated Goal: home OT Goal Formulation: All assessment and education complete, DC therapy   OT Frequency:       Co-evaluation              AM-PAC OT 6 Clicks Daily Activity     Outcome Measure Help from another person eating meals?: None Help from another person taking care of personal grooming?: None Help from another person toileting, which includes using toliet, bedpan, or urinal?: None Help from another person bathing (including washing, rinsing, drying)?: A Little Help from another person to put on and taking off regular upper body clothing?: None Help from another person to put on and taking off regular lower body clothing?: A Little 6 Click Score: 22   End of Session Equipment Utilized During Treatment: Gait belt Nurse Communication:  Mobility status  Activity Tolerance: Patient tolerated treatment well Patient left: in chair;with call bell/phone within reach;with chair alarm set  OT Visit Diagnosis: Pain;History of falling (Z91.81) Pain - Right/Left: Right Pain - part of body:  (ribs)                Time: 8784-8749 OT Time Calculation (min): 35 min Charges:  OT General Charges $OT Visit: 1 Visit OT Evaluation $OT Eval Low Complexity: 1 Low  Nasario Czerniak, OT/L   Acute OT Clinical Specialist Acute Rehabilitation Services Pager [phone] Office [phone]   [facility] 03/30/2024, 1:50 PM

## 2024-03-30 NOTE — Assessment & Plan Note (Deleted)
 Continue Prolia  injections. Already taking calcium  and vitamin D .

## 2024-03-30 NOTE — TOC Transition Note (Signed)
 Transition of Care [facility]) - Discharge Note   Patient Details  Name: Michele Hanson MRN: [id number] Date of Birth: October 14, 1950  [facility] CM/SW Contact:  Toy LITTIE Agar, RN Phone Number:[phone]  03/30/2024, 4:04 PM   Clinical Narrative:    CM at bedside to present observation notice and set up home health. Patient has been discharged. CM attempted to call patient with no response [phone]. Voicemail has been left. Will await return call.          Patient Goals and CMS Choice            Discharge Placement                       Discharge Plan and Services Additional resources added to the After Visit Summary for                                       Social Drivers of Health (SDOH) Interventions SDOH Screenings   Food Insecurity: No Food Insecurity (03/29/2024)  Housing: Low Risk (03/29/2024)  Transportation Needs: No Transportation Needs (03/29/2024)  Utilities: Not At Risk (03/29/2024)  Alcohol Screen: Low Risk (12/30/2023)  Depression (PHQ2-9): Low Risk (08/14/2023)  Financial Resource Strain: Low Risk (12/30/2023)  Physical Activity: Insufficiently Active (12/30/2023)  Social Connections: Socially Isolated (03/29/2024)  Stress: No Stress Concern Present (12/30/2023)  Tobacco Use: Medium Risk (03/29/2024)  Health Literacy: Adequate Health Literacy (08/14/2023)     Readmission Risk Interventions     No data to display

## 2024-03-30 NOTE — Assessment & Plan Note (Deleted)
 Observation/transfer to MedSurg. Analgesics as needed. Antiemetics as needed. Continue Lidoderm  patches. Incentive spirometry.

## 2024-03-30 NOTE — Discharge Summary (Signed)
 Physician Discharge Summary   Patient: Michele Hanson MRN: [id number] DOB: 02-28-1951  Admit date:     03/28/2024  Discharge date: 03/30/2024  Discharge Physician: Delon Herald   PCP: Berneta Elsie Sayre, MD   Recommendations at discharge:   You are being discharged with home health physical therapy You are being prescribed a limited number of narcotic pain pills; take as directed and do not drive or make important decisions while taking this medication  Follow up with Dr. Berneta in 1-2 weeks  Discharge Diagnoses: Principal Problem:   Multiple rib fractures Active Problems:   Spinal stenosis of lumbar region with neurogenic claudication   Elevated cholesterol   Essential hypertension   Depression with anxiety   Hepatic steatosis   OP (osteoporosis)   GERD (gastroesophageal reflux disease)   Spondylolisthesis   Splenic lesion    Hospital Course: 73yo with h/o anxiety/depression, Hep C with fatty liver, HTN, HLD, and chronic back pain who presented on 12/15 with R chest wall pain after a fall.  She was found to have acute R lateral 8, 9, 10, and 11 rib fractures.  Treated with opiates and lidocaine  patch.  Assessment and Plan:  Multiple rib fractures Recurrent falls that patient reports could be due to alcohol use, denies violence at home Found to have multiple R rib fractures Pain is improving Continue Lidoderm  patches, limited number of Percocet Follows outpatient with pain management for chronic pain but reports not being on chronic opiates  Spinal stenosis of lumbar region with neurogenic claudication Spondylolisthesis Analgesics as needed as above Planned for hemilaminectomy on 1/14 Continue gabapentin   Elevated cholesterol Continue atorvastatin   Essential hypertension Continue HCTZ  Depression with anxiety Continue escitalopram  Splenic lesions Multiple lesions up to 1.6 cm; hemangiomas vs lymphoma vs metastatic disease Needs outpatient PET/CT for  further evaluation   OP (osteoporosis) Continue Prolia  injections Already taking calcium  and vitamin D   GERD (gastroesophageal reflux disease) Continue PPI   Alcohol use d/o Acknowledges that she probably needs to cut down This fall was associated with ETOH overuse Cessation encouraged       Consultants: None   Procedures: None   Antibiotics: None     Pain control - [place]  Controlled Substance Reporting System database was reviewed. and patient was instructed, not to drive, operate heavy machinery, perform activities at heights, swimming or participation in water activities or provide baby-sitting services while on Pain, Sleep and Anxiety Medications; until their outpatient Physician has advised to do so again. Also recommended to not to take more than prescribed Pain, Sleep and Anxiety Medications.    Disposition: Home Diet recommendation:  Regular diet DISCHARGE MEDICATION: Allergies as of 03/30/2024       Reactions   Ciprofloxacin Other (See Comments)   CAUSED PERMANENT NEUROPATHY IN BOTH FEET!!!   Neomycin Itching, Other (See Comments)   Eye drops version   Nickel Itching, Rash        Medication List     TAKE these medications    ADULT GUMMY PO Take 2 Pieces by mouth See admin instructions. Chew 2 gummies by mouth in the morning after breakfast   ALKA-SELTZER HEARTBURN + GAS PO Take 2 tablets by mouth daily as needed (for heartburn- dissolve as directed).   aspirin  325 MG tablet Take 1 tablet (325 mg total) by mouth daily as needed for mild pain (pain score 1-3) (for pain). What changed:  how much to take when to take this reasons to take this   atorvastatin  20  MG tablet Commonly known as: LIPITOR TAKE 1 TABLET BY MOUTH EVERY DAY   Azelastine  HCl 137 MCG/SPRAY Soln Place 1 spray into both nostrils 2 (two) times daily as needed (for seasonal allergies).   citalopram  20 MG tablet Commonly known as: CELEXA  TAKE 1 TABLET BY MOUTH 1  TIME DAILY What changed: See the new instructions.   denosumab  60 MG/ML Sosy injection Commonly known as: PROLIA  Inject 60 mg into the skin every 6 (six) months.   Ex-Lax 15 MG Chew Generic drug: Sennosides Chew 15 mg by mouth daily as needed (for constipation).   fluticasone  50 MCG/ACT nasal spray Commonly known as: FLONASE  USE 1 SPRAY IN EACH NOSTRIL 1 TIME DAILY What changed: See the new instructions.   gabapentin  400 MG capsule Commonly known as: NEURONTIN  TAKE 1 CAPSULE BY MOUTH THREE TIMES A DAY What changed: See the new instructions.   hydrochlorothiazide  25 MG tablet Commonly known as: HYDRODIURIL  TAKE 1 TABLET (25 MG TOTAL) BY MOUTH DAILY.   lidocaine  5 % Commonly known as: LIDODERM  Place 1 patch onto the skin daily. Remove & Discard patch within 12 hours or as directed by MD Start taking on: March 31, 2024   loperamide 2 MG tablet Commonly known as: IMODIUM A-D Take 2 mg by mouth 4 (four) times daily as needed for diarrhea or loose stools.   Lumify PF 0.025 % Soln Generic drug: Brimonidine Tartrate (PF) Place 1 drop into both eyes in the morning.   LUTEIN PO Take 1 tablet by mouth daily.   Milk Thistle 250 MG Caps Take 250 mg by mouth daily.   omeprazole  20 MG capsule Commonly known as: PRILOSEC TAKE 1 CAPSULE BY MOUTH 1 TIME DAILY What changed: See the new instructions.   oxyCODONE -acetaminophen  5-325 MG tablet Commonly known as: Percocet Take 1 tablet by mouth every 4 (four) hours as needed for severe pain (pain score 7-10).   VITAMIN B 12 PO Take 1 tablet by mouth 2 (two) times a week.        Follow-up Information     Berneta Elsie Sayre, MD. Schedule an appointment as soon as possible for a visit in 1 week(s).   Specialty: Family Medicine Contact information: [address] [place] [address] [phone]                Discharge Exam:   Subjective: Feeling better.  Pain is improving.  Feels like she can go  home today.   Objective: Vitals:   03/30/24 0605 03/30/24 1435  BP: 124/80 130/88  Pulse: 84 88  Resp: 18 15  Temp: 98.1 F (36.7 C) 98 F (36.7 C)  SpO2: 96% 94%    Intake/Output Summary (Last 24 hours) at 03/30/2024 1521 Last data filed at 03/30/2024 1300 Gross per 24 hour  Intake 480 ml  Output --  Net 480 ml   Filed Weights   03/28/24 1430  Weight: 71.7 kg    Exam:  General:  Appears calm and comfortable and is in NAD, holding pillow to chest for bracing Eyes:  normal lids, iris ENT:  grossly normal hearing, lips & tongue, mmm Cardiovascular:  RRR. No LE edema.  Respiratory:   CTA bilaterally with no wheezes/rales/rhonchi.  Normal respiratory effort. Abdomen:  soft, NT, ND Skin:  no rash or induration seen on limited exam; healing facial ecchymoses/laceration Musculoskeletal:  grossly normal tone BUE/BLE, good ROM, no bony abnormality Psychiatric:  grossly normal mood and affect, speech fluent and appropriate, AOx3 Neurologic:  CN  2-12 grossly intact, moves all extremities in coordinated fashion  Data Reviewed: I have reviewed the patient's lab results since admission.  Pertinent labs for today include:   None    Condition at discharge: improving  The results of significant diagnostics from this hospitalization (including imaging, microbiology, ancillary and laboratory) are listed below for reference.   Imaging Studies: CT CHEST ABDOMEN PELVIS W CONTRAST Result Date: 03/28/2024 EXAM: CT CHEST, ABDOMEN AND PELVIS WITH CONTRAST 03/28/2024 04:42:00 PM TECHNIQUE: CT of the chest, abdomen and pelvis was performed with the administration of 92 mL of iohexol  (OMNIPAQUE ) 300 MG/ML solution. Multiplanar reformatted images are provided for review. Automated exposure control, iterative reconstruction, and/or weight based adjustment of the mA/kV was utilized to reduce the radiation dose to as low as reasonably achievable. COMPARISON: Ultrasound abdomen 02/03/2019 and  ultrasound pelvis 10/21/2022. CLINICAL HISTORY: Polytrauma, blunt; right sided. [name] out of bed last night with left flank pain. FINDINGS: CHEST: MEDIASTINUM AND LYMPH NODES: Heart and pericardium are unremarkable. The central airways are clear. No mediastinal, hilar or axillary lymphadenopathy. LUNGS AND PLEURA: No focal consolidation or pulmonary edema. No pleural effusion or pneumothorax. ABDOMEN AND PELVIS: LIVER: Mild diffuse fatty infiltration of the liver. No focal liver lesions. GALLBLADDER AND BILE DUCTS: Gallbladder is unremarkable. No biliary ductal dilatation. SPLEEN: Multiple low attenuation lesions are demonstrated throughout the spleen, the largest measuring 1.6 cm in diameter. These may represent hemangiomas, although due to the finding of multiple lesions, lymphoma or metastatic disease could also have this appearance. PANCREAS: No acute abnormality. ADRENAL GLANDS: No acute abnormality. KIDNEYS, URETERS AND BLADDER: No stones in the kidneys or ureters. No hydronephrosis. No perinephric or periureteral stranding. Urinary bladder is unremarkable. GI AND BOWEL: Stomach demonstrates no acute abnormality. Diverticulosis of the sigmoid colon. No evidence of acute diverticulitis. There is no bowel obstruction. REPRODUCTIVE ORGANS: No acute abnormality. PERITONEUM AND RETROPERITONEUM: No ascites. No free air. VASCULATURE: Aorta is normal in caliber. Calcification of the aorta. No aneurysm. ABDOMINAL AND PELVIS LYMPH NODES: No lymphadenopathy. BONES AND SOFT TISSUES: Acute fractures of the right lateral 11th, 10th, 9th, and 8th ribs with additional old fractures of bilateral ribs. No focal bone lesions. Postoperative changes in the right shoulder. Postoperative fixation of the lower lumbar spine. Degenerative changes in the spine. No acute vertebral compression deformities. The sternum is nondepressed. Pelvis and hips appear intact. Abdominal wall musculature appears intact. IMPRESSION: 1. Acute fractures  of the right lateral 11th, 10th, 9th, and 8th ribs, with additional old bilateral rib fractures. 2. Multiple low-attenuation splenic lesions up to 1.6 cm, which could represent hemangiomas; lymphoma or metastatic disease is also possible. Consider short-term follow-up versus PET CT for further evaluation if clinically indicated. Electronically signed by: Elsie Gravely MD 03/28/2024 05:35 PM EST RP Workstation: [id number]   CT Cervical Spine Wo Contrast Result Date: 03/28/2024 EXAM: CT CERVICAL SPINE WITHOUT CONTRAST 03/28/2024 04:40:00 PM TECHNIQUE: CT of the cervical spine was performed without the administration of intravenous contrast. Multiplanar reformatted images are provided for review. Automated exposure control, iterative reconstruction, and/or weight based adjustment of the mA/kV was utilized to reduce the radiation dose to as low as reasonably achievable. COMPARISON: None available. CLINICAL HISTORY: Neck trauma (Age >= 65y) FINDINGS: BONES AND ALIGNMENT: Straightening of the normal cervical lordosis. There is 2 mm degenerative anterolisthesis of C3 on C4 and C4 on C5. Additional trace degenerative retrolisthesis of C5 on C6 and trace degenerative anterolisthesis of C6 on C7 and C7 on T1. There are no findings to  suggest traumatic malalignment. No acute fracture. DEGENERATIVE CHANGES: No high grade osseous spinal canal stenosis. Disc space narrowing at C5-C6 with associated endplate sclerosis and degenerative endplate osteophytes. Facet arthrosis most pronounced on the left from C3-C4 through C6-C7. Uncovertebral hypertrophy greatest at C5-C6. There is foraminal stenosis at multiple levels most pronounced at C5-C6. SOFT TISSUES: No prevertebral soft tissue swelling. IMPRESSION: 1. No evidence of acute traumatic injury. 2. Multilevel degenerative changes as above. Electronically signed by: Donnice Mania MD 03/28/2024 05:19 PM EST RP Workstation: [id number]   CT Head Wo Contrast Result Date:  03/28/2024 EXAM: CT HEAD WITHOUT 03/28/2024 04:40:00 PM TECHNIQUE: CT of the head was performed without the administration of intravenous contrast. Automated exposure control, iterative reconstruction, and/or weight based adjustment of the mA/kV was utilized to reduce the radiation dose to as low as reasonably achievable. COMPARISON: None available. CLINICAL HISTORY: Head trauma, minor (Age >= 65y) FINDINGS: BRAIN AND VENTRICLES: No acute intracranial hemorrhage. No mass effect or midline shift. No extra-axial fluid collection. No evidence of acute infarct. No hydrocephalus. Mild cerebral volume loss. Mild chronic microvascular ischemic changes. Mild calcific atheromatous disease in carotid siphons. ORBITS: Left periorbital soft tissue contusion. SINUSES AND MASTOIDS: No acute abnormality. SOFT TISSUES AND SKULL: No acute skull fracture. IMPRESSION: 1. No acute intracranial abnormality. 2. Left periorbital soft tissue contusion. Electronically signed by: Donnice Mania MD 03/28/2024 05:13 PM EST RP Workstation: [id number]    Microbiology: Results for orders placed or performed during the hospital encounter of 03/20/21  Surgical pcr screen     Status: None   Collection Time: 03/14/21 11:58 AM   Specimen: Nasal Mucosa; Nasal Swab  Result Value Ref Range Status   MRSA, PCR NEGATIVE NEGATIVE Final   Staphylococcus aureus NEGATIVE NEGATIVE Final    Comment: (NOTE) The Xpert SA Assay (FDA approved for NASAL specimens in patients 57 years of age and older), is one component of a comprehensive surveillance program. It is not intended to diagnose infection nor to guide or monitor treatment. Performed at [facility], [address] [place], [place] [address]     Labs: CBC: Recent Labs  Lab 03/28/24 1433  WBC 7.0  HGB 13.9  HCT 40.7  MCV 90.6  PLT 163   Basic Metabolic Panel: Recent Labs  Lab 03/28/24 1433  NA 139  K 3.5  CL 98  CO2 25  GLUCOSE 109*  BUN 20  CREATININE 0.66   CALCIUM  9.6   Liver Function Tests: Recent Labs  Lab 03/28/24 1433  AST 41  ALT 43  ALKPHOS 67  BILITOT 0.6  PROT 7.2  ALBUMIN 4.6   CBG: No results for input(s): GLUCAP in the last 168 hours.  Discharge time spent: greater than 30 minutes.  Signed: Delon Herald, MD [facility] 03/30/2024

## 2024-03-31 ENCOUNTER — Telehealth: Payer: Self-pay

## 2024-03-31 NOTE — Transitions of Care (Post Inpatient/ED Visit) (Signed)
 03/31/2024  Name: Michele Hanson MRN: [id number] DOB: [birth year]  Today's TOC FU Call Status: Today's TOC FU Call Status:: Successful TOC FU Call Completed TOC FU Call Complete Date: 03/31/24  Patient's Name and Date of Birth confirmed. Name, DOB  Transition Care Management Follow-up Telephone Call Date of Discharge: 03/30/24 Discharge Facility: [facility]) Type of Discharge: Inpatient Admission Primary Inpatient Discharge Diagnosis:: fall How have you been since you were released from the hospital?: Better Any questions or concerns?: No  Items Reviewed: Did you receive and understand the discharge instructions provided?: Yes Medications obtained,verified, and reconciled?: Yes (Medications Reviewed) Any new allergies since your discharge?: No Dietary orders reviewed?: Yes Do you have support at home?: Yes People in Home [RPT]: spouse  Medications Reviewed Today: Medications Reviewed Today     Reviewed by Emmitt Pan, LPN (Licensed Practical Nurse) on 03/31/24 at 1605  Med List Status: <None>   Medication Order Taking? Sig Documenting Provider Last Dose Status Informant  aspirin  325 MG tablet [id number] Yes Take 1 tablet (325 mg total) by mouth daily as needed for mild pain (pain score 1-3) (for pain). Barbarann Nest, MD  Active   atorvastatin  (LIPITOR) 20 MG tablet [id number] Yes TAKE 1 TABLET BY MOUTH EVERY DAY Berneta Elsie Sayre, MD  Active Self  Azelastine  HCl 137 MCG/SPRAY SOLN [id number] Yes Place 1 spray into both nostrils 2 (two) times daily as needed (for seasonal allergies). [provider]  Active Self  Calcium  Carbonate-Simethicone (ALKA-SELTZER HEARTBURN + GAS PO) [id number] Yes Take 2 tablets by mouth daily as needed (for heartburn- dissolve as directed). [provider]  Active Self  citalopram  (CELEXA ) 20 MG tablet [id number] Yes TAKE 1 TABLET BY MOUTH 1 TIME DAILY  Patient taking differently: Take 20 mg by mouth in the morning.    Berneta Elsie Sayre, MD  Active Self  Cyanocobalamin  (VITAMIN B 12 PO) [id number] Yes Take 1 tablet by mouth 2 (two) times a week. [provider]  Active Self           Med Note MARISA, NATHANEL SAILOR   Tue Mar 29, 2024  4:34 PM) Strength not stated  denosumab  (PROLIA ) 60 MG/ML SOSY injection [id number] Yes Inject 60 mg into the skin every 6 (six) months. [provider]  Active Self  EX-LAX 15 MG CHEW [id number] Yes Chew 15 mg by mouth daily as needed (for constipation). [provider]  Active Self  fluticasone  (FLONASE ) 50 MCG/ACT nasal spray [id number] Yes USE 1 SPRAY IN EACH NOSTRIL 1 TIME DAILY  Patient taking differently: Place 1 spray into both nostrils daily as needed (for seasonal allergies).   Berneta Elsie Sayre, MD  Active Self  gabapentin  (NEURONTIN ) 400 MG capsule [id number] Yes TAKE 1 CAPSULE BY MOUTH THREE TIMES A DAY  Patient taking differently: Take 400 mg by mouth See admin instructions. Take 400 mg by mouth two to three times a day   Berneta Elsie Sayre, MD  Active Self  hydrochlorothiazide  (HYDRODIURIL ) 25 MG tablet [id number] Yes TAKE 1 TABLET (25 MG TOTAL) BY MOUTH DAILY. Berneta Elsie Sayre, MD  Active Self  lidocaine  (LIDODERM ) 5 % [id number] Yes Place 1 patch onto the skin daily. Remove & Discard patch within 12 hours or as directed by MD Barbarann Nest, MD  Active   loperamide (IMODIUM A-D) 2 MG tablet [id number] Yes Take 2 mg by mouth 4 (four) times daily as needed for diarrhea or loose stools. [provider]  Active Self  LUMIFY PF 0.025 %  SOLN [id number] Yes Place 1 drop into both eyes in the morning. [provider]  Active Self  LUTEIN PO [id number] Yes Take 1 tablet by mouth daily. [provider]  Active Self           Med Note MARISA, NATHANEL SAILOR   Tue Mar 29, 2024  4:31 PM) No strength noted  Milk Thistle 250 MG CAPS [id number] Yes Take 250 mg by mouth daily. [provider]  Active Self  Multiple  Vitamins-Minerals (ADULT GUMMY PO) [id number] Yes Take 2 Pieces by mouth See admin instructions. Chew 2 gummies by mouth in the morning after breakfast [provider]  Active Self           Med Note JACKOLYN WADDELL VEAR Stevan Feb 25, 2023 11:21 AM)    omeprazole  (PRILOSEC) 20 MG capsule [id number] Yes TAKE 1 CAPSULE BY MOUTH 1 TIME DAILY  Patient taking differently: Take 20 mg by mouth daily before breakfast.   Berneta Elsie Sayre, MD  Active Self  oxyCODONE -acetaminophen  (PERCOCET) 5-325 MG tablet [id number] Yes Take 1 tablet by mouth every 4 (four) hours as needed for severe pain (pain score 7-10). Barbarann Nest, MD  Active             Home Care and Equipment/Supplies: Were Home Health Services Ordered?: Yes Name of Home Health Agency:: unknown Has Agency set up a time to come to your home?: No Any new equipment or medical supplies ordered?: NA  Functional Questionnaire: Do you need assistance with bathing/showering or dressing?: No Do you need assistance with meal preparation?: No Do you need assistance with eating?: No Do you have difficulty maintaining continence: No Do you need assistance with getting out of bed/getting out of a chair/moving?: No Do you have difficulty managing or taking your medications?: No  Follow up appointments reviewed: PCP Follow-up appointment confirmed?: Yes Date of PCP follow-up appointment?: 04/05/24 Follow-up Provider: [facility] Follow-up appointment confirmed?: Yes Date of Specialist follow-up appointment?: 04/27/24 Follow-Up Specialty Provider:: surgeon-neuro Do you need transportation to your follow-up appointment?: No Do you understand care options if your condition(s) worsen?: Yes-patient verbalized understanding    SIGNATURE Julian Lemmings, LPN [facility] Nurse Health Advisor Direct Dial [phone]

## 2024-04-05 ENCOUNTER — Encounter: Payer: Self-pay | Admitting: Family Medicine

## 2024-04-05 ENCOUNTER — Ambulatory Visit: Admitting: Family Medicine

## 2024-04-05 VITALS — BP 118/74 | HR 88 | Temp 98.1°F | Ht 65.0 in | Wt 166.8 lb

## 2024-04-05 DIAGNOSIS — S2241XD Multiple fractures of ribs, right side, subsequent encounter for fracture with routine healing: Secondary | ICD-10-CM

## 2024-04-05 DIAGNOSIS — K760 Fatty (change of) liver, not elsewhere classified: Secondary | ICD-10-CM

## 2024-04-05 DIAGNOSIS — Z09 Encounter for follow-up examination after completed treatment for conditions other than malignant neoplasm: Secondary | ICD-10-CM | POA: Diagnosis not present

## 2024-04-05 DIAGNOSIS — D7389 Other diseases of spleen: Secondary | ICD-10-CM | POA: Diagnosis not present

## 2024-04-05 DIAGNOSIS — F1090 Alcohol use, unspecified, uncomplicated: Secondary | ICD-10-CM | POA: Diagnosis not present

## 2024-04-05 LAB — CBC WITH DIFFERENTIAL/PLATELET
Basophils Absolute: 0 K/uL (ref 0.0–0.1)
Basophils Relative: 0.4 % (ref 0.0–3.0)
Eosinophils Absolute: 0.1 K/uL (ref 0.0–0.7)
Eosinophils Relative: 2.8 % (ref 0.0–5.0)
HCT: 39.1 % (ref 36.0–46.0)
Hemoglobin: 13.3 g/dL (ref 12.0–15.0)
Lymphocytes Relative: 27 % (ref 12.0–46.0)
Lymphs Abs: 1.1 K/uL (ref 0.7–4.0)
MCHC: 34.1 g/dL (ref 30.0–36.0)
MCV: 91 fl (ref 78.0–100.0)
Monocytes Absolute: 0.4 K/uL (ref 0.1–1.0)
Monocytes Relative: 10.1 % (ref 3.0–12.0)
Neutro Abs: 2.4 K/uL (ref 1.4–7.7)
Neutrophils Relative %: 59.7 % (ref 43.0–77.0)
Platelets: 181 K/uL (ref 150.0–400.0)
RBC: 4.3 Mil/uL (ref 3.87–5.11)
RDW: 12.6 % (ref 11.5–15.5)
WBC: 3.9 K/uL — ABNORMAL LOW (ref 4.0–10.5)

## 2024-04-05 NOTE — Telephone Encounter (Signed)
 Please review patient message and advise. Thanks. Dm/cma

## 2024-04-05 NOTE — Progress Notes (Addendum)
 "  Established Patient Office Visit   Subjective:  Patient ID: Michele Hanson, female    DOB: [birth year]  Age: 73 y.o. MRN: [id number]  Chief Complaint  Patient presents with   Hospitalization Follow-up    Hospital f/u from 03/30/24 Fall.  ? About CT results in hospital    HPI Encounter Diagnoses  Name Primary?   Hospital discharge follow-up Yes   Splenic lesion    Closed fracture of multiple ribs of right side with routine healing, subsequent encounter    Hepatic steatosis    Alcohol use    For hospital discharge follow-up status post fall.  Sustained fractures of the right 11th through 8th ribs.  Multiple low-attenuation splenic lesions were noted.  Differential included hemangioma, lymphoma possible metastatic disease.  Besides recent rib fractures and ongoing back issues patient feels okay.  She has gained weight.  Denies night sweats.   Review of Systems  Constitutional: Negative.  Negative for chills, diaphoresis, fever and weight loss.  HENT: Negative.    Eyes:  Negative for blurred vision, discharge and redness.  Respiratory: Negative.    Cardiovascular:  Positive for chest pain.  Gastrointestinal:  Negative for abdominal pain.  Genitourinary: Negative.   Musculoskeletal:  Positive for back pain. Negative for myalgias.  Skin:  Negative for rash.  Neurological:  Negative for tingling, loss of consciousness and weakness.  Endo/Heme/Allergies:  Negative for polydipsia.    Current Medications[1]   Objective:     BP 118/74   Pulse 88   Temp 98.1 F (36.7 C) (Temporal)   Ht 5' 5 (1.651 m)   Wt 166 lb 12.8 oz (75.7 kg)   SpO2 98%   BMI 27.76 kg/m  Wt Readings from Last 3 Encounters:  04/05/24 166 lb 12.8 oz (75.7 kg)  03/28/24 158 lb (71.7 kg)  12/31/23 158 lb 9.6 oz (71.9 kg)      Physical Exam Constitutional:      General: She is not in acute distress.    Appearance: Normal appearance. She is not ill-appearing, toxic-appearing or diaphoretic.  HENT:      Head: Normocephalic and atraumatic.     Right Ear: External ear normal.     Left Ear: External ear normal.  Eyes:     General: No scleral icterus.       Right eye: No discharge.        Left eye: No discharge.     Extraocular Movements: Extraocular movements intact.     Conjunctiva/sclera: Conjunctivae normal.  Pulmonary:     Effort: Pulmonary effort is normal. No respiratory distress.  Skin:    General: Skin is warm and dry.  Neurological:     Mental Status: She is alert and oriented to person, place, and time.  Psychiatric:        Mood and Affect: Mood normal.        Behavior: Behavior normal.      Results for orders placed or performed in visit on 04/05/24  HM MAMMOGRAPHY  Result Value Ref Range   HM Mammogram 0-4 Bi-Rad 0-4 Bi-Rad, Self Reported Normal  HM PAP SMEAR  Result Value Ref Range   HM Pap smear WNL   Results for orders placed or performed in visit on 04/05/24  CBC with Differential/Platelet  Result Value Ref Range   WBC 3.9 (L) 4.0 - 10.5 K/uL   RBC 4.30 3.87 - 5.11 Mil/uL   Hemoglobin 13.3 12.0 - 15.0 g/dL   HCT 60.8 63.9 - 53.9 %  MCV 91.0 78.0 - 100.0 fl   MCHC 34.1 30.0 - 36.0 g/dL   RDW 87.3 88.4 - 84.4 %   Platelets 181.0 150.0 - 400.0 K/uL   Neutrophils Relative % 59.7 43.0 - 77.0 %   Lymphocytes Relative 27.0 12.0 - 46.0 %   Monocytes Relative 10.1 3.0 - 12.0 %   Eosinophils Relative 2.8 0.0 - 5.0 %   Basophils Relative 0.4 0.0 - 3.0 %   Neutro Abs 2.4 1.4 - 7.7 K/uL   Lymphs Abs 1.1 0.7 - 4.0 K/uL   Monocytes Absolute 0.4 0.1 - 1.0 K/uL   Eosinophils Absolute 0.1 0.0 - 0.7 K/uL   Basophils Absolute 0.0 0.0 - 0.1 K/uL      The 10-year ASCVD risk score (Arnett DK, et al., 2019) is: 14.4%    Assessment & Plan:   Hospital discharge follow-up  Splenic lesion -     CT ABDOMEN PELVIS W CONTRAST; Future -     CBC with Differential/Platelet -     Multiple Myeloma Panel (SPEP&IFE w/QIG) -     NM PET SUPER D CT; Future  Closed  fracture of multiple ribs of right side with routine healing, subsequent encounter  Hepatic steatosis  Alcohol use    Return in about 3 months (around 07/04/2024).  Follow-up CT ordered.  Patient will let me know if she would like to pursue the PET scan CT.  Hepatic steatosis has been noted.  We discussed that this is associated with alcohol usage.  Elsie Sim Lent, MD     [1]  Current Outpatient Medications:    aspirin  325 MG tablet, Take 1 tablet (325 mg total) by mouth daily as needed for mild pain (pain score 1-3) (for pain)., Disp: , Rfl:    atorvastatin  (LIPITOR) 20 MG tablet, TAKE 1 TABLET BY MOUTH EVERY DAY, Disp: 90 tablet, Rfl: 3   Azelastine  HCl 137 MCG/SPRAY SOLN, Place 1 spray into both nostrils 2 (two) times daily as needed (for seasonal allergies)., Disp: , Rfl:    b complex vitamins capsule, Take 1 capsule by mouth daily., Disp: , Rfl:    Calcium  Carbonate-Simethicone (ALKA-SELTZER HEARTBURN + GAS PO), Take 2 tablets by mouth daily as needed (for heartburn- dissolve as directed)., Disp: , Rfl:    citalopram  (CELEXA ) 20 MG tablet, TAKE 1 TABLET BY MOUTH 1 TIME DAILY, Disp: 90 tablet, Rfl: 1   denosumab  (PROLIA ) 60 MG/ML SOSY injection, Inject 60 mg into the skin every 6 (six) months., Disp: , Rfl:    EX-LAX 15 MG CHEW, Chew 15 mg by mouth daily as needed (for constipation)., Disp: , Rfl:    fluticasone  (FLONASE ) 50 MCG/ACT nasal spray, USE 1 SPRAY IN EACH NOSTRIL 1 TIME DAILY, Disp: 48 mL, Rfl: 1   gabapentin  (NEURONTIN ) 400 MG capsule, TAKE 1 CAPSULE BY MOUTH THREE TIMES A DAY, Disp: 90 capsule, Rfl: 2   hydrochlorothiazide  (HYDRODIURIL ) 25 MG tablet, TAKE 1 TABLET (25 MG TOTAL) BY MOUTH DAILY., Disp: 90 tablet, Rfl: 3   lidocaine  (LIDODERM ) 5 %, Place 1 patch onto the skin daily. Remove & Discard patch within 12 hours or as directed by MD, Disp: 30 patch, Rfl: 0   loperamide (IMODIUM A-D) 2 MG tablet, Take 2 mg by mouth 4 (four) times daily as needed for diarrhea or  loose stools., Disp: , Rfl:    LUMIFY PF 0.025 % SOLN, Place 1 drop into both eyes in the morning., Disp: , Rfl:    LUTEIN PO, Take  1 tablet by mouth daily., Disp: , Rfl:    Milk Thistle 250 MG CAPS, Take 250 mg by mouth daily., Disp: , Rfl:    Multiple Vitamins-Minerals (ADULT GUMMY PO), Take 2 Pieces by mouth See admin instructions. Chew 2 gummies by mouth in the morning after breakfast, Disp: , Rfl:    omeprazole  (PRILOSEC) 20 MG capsule, TAKE 1 CAPSULE BY MOUTH 1 TIME DAILY, Disp: 90 capsule, Rfl: 1   oxyCODONE -acetaminophen  (PERCOCET) 5-325 MG tablet, Take 1 tablet by mouth every 4 (four) hours as needed for severe pain (pain score 7-10)., Disp: 20 tablet, Rfl: 0  "

## 2024-04-08 NOTE — Addendum Note (Signed)
 Addended by: BERNETA FALLOW A on: 04/08/2024 10:14 AM   Modules accepted: Orders

## 2024-04-11 ENCOUNTER — Other Ambulatory Visit: Payer: Self-pay | Admitting: Family Medicine

## 2024-04-11 DIAGNOSIS — D7389 Other diseases of spleen: Secondary | ICD-10-CM

## 2024-04-12 ENCOUNTER — Ambulatory Visit: Payer: Self-pay | Admitting: Family Medicine

## 2024-04-12 LAB — MULTIPLE MYELOMA PANEL, SERUM
Albumin SerPl Elph-Mcnc: 3.6 g/dL (ref 2.9–4.4)
Albumin/Glob SerPl: 1.4 (ref 0.7–1.7)
Alpha 1: 0.3 g/dL (ref 0.0–0.4)
Alpha2 Glob SerPl Elph-Mcnc: 0.7 g/dL (ref 0.4–1.0)
B-Globulin SerPl Elph-Mcnc: 1 g/dL (ref 0.7–1.3)
Gamma Glob SerPl Elph-Mcnc: 0.6 g/dL (ref 0.4–1.8)
Globulin, Total: 2.6 g/dL (ref 2.2–3.9)
IgG (Immunoglobin G), Serum: 596 mg/dL (ref 586–1602)
IgM (Immunoglobulin M), Srm: 154 mg/dL (ref 26–217)
Immunoglobulin A, (IgA) QN, Serum: 176 mg/dL (ref 64–422)
M Protein SerPl Elph-Mcnc: 0.2 g/dL — ABNORMAL HIGH
Total Protein: 6.2 g/dL (ref 6.0–8.5)

## 2024-04-20 ENCOUNTER — Other Ambulatory Visit: Payer: Self-pay | Admitting: Neurological Surgery

## 2024-04-21 NOTE — Pre-Procedure Instructions (Signed)
 Surgical Instructions   Your procedure is scheduled on Wednesday, January 14th. Report to [facility] Entr[facility] A at 06:30 A.M., then check in with the Admitting office. Any questions or running late day of surgery: call [phone]  Questions prior to your surgery date: call [phone], Monday-Friday, 8am-4pm. If you experience any cold or flu symptoms such as cough, fever, chills, shortness of breath, etc. between now and your scheduled surgery, please notify us  at the above number.     Remember:  Do not eat or drink after midnight the night before your surgery    Take these medicines the morning of surgery with A SIP OF WATER  atorvastatin  (LIPITOR)  citalopram  (CELEXA )   May take these medicines IF NEEDED: acetaminophen  (TYLENOL )  Azelastine  HCl  cyclobenzaprine (FLEXERIL)  fluticasone  (FLONASE )  gabapentin  (NEURONTIN )  omeprazole  (PRILOSEC)    Follow your surgeon's instructions on when to stop Aspirin .  If no instructions were given by your surgeon then you will need to call the office to get those instructions.    One week prior to surgery, STOP taking any Aleve , Naproxen , Ibuprofen, Motrin, Advil, Goody's, BC's, all herbal medications, fish oil, and non-prescription vitamins.                     Do NOT Smoke (Tobacco/Vaping) for 24 hours prior to your procedure.  If you use a CPAP at night, you may bring your mask/headgear for your overnight stay.   You will be asked to remove any contacts, glasses, piercing's, hearing aid's, dentures/partials prior to surgery. Please bring cases for these items if needed.    Your surgeon will determine if you are to be admitted or discharged the same day.  Patients discharged the day of surgery will not be allowed to drive home, and someone needs to stay with them for 24 hours.  SURGICAL WAITING ROOM VISITATION Patients may have no more than 2 support people in the waiting area - these visitors may rotate.   Pre-op nurse  will coordinate an appropriate time for 2 ADULT support persons, who may not rotate, to accompany patient in pre-op.  Children under the age of 41 must have an adult with them who is not the patient and must remain in the main waiting area with an adult.  If the patient needs to stay at the hospital during part of their recovery, the visitor guidelines for inpatient rooms apply.  Please refer to the [facility] website for the visitor guidelines for any additional information.   If you received a COVID test during your pre-op visit  it is requested that you wear a mask when out in public, stay away from anyone that may not be feeling well and notify your surgeon if you develop symptoms. If you have been in contact with anyone that has tested positive in the last 10 days please notify you surgeon.      Pre-operative 4 CHG Bathing Instructions   You can play a key role in reducing the risk of infection after surgery. Your skin needs to be as free of germs as possible. You can reduce the number of germs on your skin by washing with CHG (chlorhexidine  gluconate) soap before surgery. CHG is an antiseptic soap that kills germs and continues to kill germs even after washing.   DO NOT use if you have an allergy to chlorhexidine /CHG or antibacterial soaps. If your skin becomes reddened or irritated, stop using the CHG and notify one of our  RNs at [phone].   Please shower with the CHG soap starting 4 days before surgery using the following schedule:     Please keep in mind the following:  DO NOT shave, including legs and underarms, starting the day of your first shower.   You may shave your face at any point before/day of surgery.  Place clean sheets on your bed the day you start using CHG soap. Use a clean washcloth (not used since being washed) for each shower. DO NOT sleep with pets once you start using the CHG.   CHG Shower Instructions:  Wash your face and private area with normal  soap. If you choose to wash your hair, wash first with your normal shampoo.  After you use shampoo/soap, rinse your hair and body thoroughly to remove shampoo/soap residue.  Turn the water OFF and apply  bottle of CHG soap to a CLEAN washcloth.  Apply CHG soap ONLY FROM YOUR NECK DOWN TO YOUR TOES (washing for 3-5 minutes)  DO NOT use CHG soap on face, private areas, open wounds, or sores.  Pay special attention to the area where your surgery is being performed.  If you are having back surgery, having someone wash your back for you may be helpful. Wait 2 minutes after CHG soap is applied, then you may rinse off the CHG soap.  Pat dry with a clean towel  Put on clean clothes/pajamas   If you choose to wear lotion, please use ONLY the CHG-compatible lotions that are listed below.  Additional instructions for the day of surgery:  If you choose, you may shower the morning of surgery with an antibacterial soap.  DO NOT APPLY any lotions, deodorants, cologne, or perfumes.   Do not bring valuables to the hospital. [facility] is not responsible for any belongings/valuables. Do not wear nail polish, gel polish, artificial nails, or any other type of covering on natural nails (fingers and toes) Do not wear jewelry or makeup Put on clean/comfortable clothes.  Please brush your teeth.  Ask your nurse before applying any prescription medications to the skin.     CHG Compatible Lotions   Aveeno Moisturizing lotion  Cetaphil Moisturizing Cream  Cetaphil Moisturizing Lotion  Clairol Herbal Essence Moisturizing Lotion, Dry Skin  Clairol Herbal Essence Moisturizing Lotion, Extra Dry Skin  Clairol Herbal Essence Moisturizing Lotion, Normal Skin  Curel Age Defying Therapeutic Moisturizing Lotion with Alpha Hydroxy  Curel Extreme Care Body Lotion  Curel Soothing Hands Moisturizing Hand Lotion  Curel Therapeutic Moisturizing Cream, Fragrance-Free  Curel Therapeutic Moisturizing Lotion,  Fragrance-Free  Curel Therapeutic Moisturizing Lotion, Original Formula  Eucerin Daily Replenishing Lotion  Eucerin Dry Skin Therapy Plus Alpha Hydroxy Crme  Eucerin Dry Skin Therapy Plus Alpha Hydroxy Lotion  Eucerin Original Crme  Eucerin Original Lotion  Eucerin Plus Crme Eucerin Plus Lotion  Eucerin TriLipid Replenishing Lotion  Keri Anti-Bacterial Hand Lotion  Keri Deep Conditioning Original Lotion Dry Skin Formula Softly Scented  Keri Deep Conditioning Original Lotion, Fragrance Free Sensitive Skin Formula  Keri Lotion Fast Absorbing Fragrance Free Sensitive Skin Formula  Keri Lotion Fast Absorbing Softly Scented Dry Skin Formula  Keri Original Lotion  Keri Skin Renewal Lotion Keri Silky Smooth Lotion  Keri Silky Smooth Sensitive Skin Lotion  Nivea Body Creamy Conditioning Oil  Nivea Body Extra Enriched Teacher, Adult Education Moisturizing Lotion Nivea Crme  Nivea Skin Firming Lotion  NutraDerm 30 Skin Lotion  NutraDerm Skin Lotion  NutraDerm Therapeutic Skin  Cream  NutraDerm Therapeutic Skin Lotion  ProShield Protective Hand Cream  Provon moisturizing lotion  Please read over the following fact sheets that you were given.

## 2024-04-22 ENCOUNTER — Encounter (HOSPITAL_COMMUNITY)
Admission: RE | Admit: 2024-04-22 | Discharge: 2024-04-22 | Disposition: A | Source: Ambulatory Visit | Attending: Family Medicine | Admitting: Family Medicine

## 2024-04-22 ENCOUNTER — Other Ambulatory Visit: Payer: Self-pay

## 2024-04-22 ENCOUNTER — Encounter (HOSPITAL_COMMUNITY)
Admission: RE | Admit: 2024-04-22 | Discharge: 2024-04-22 | Disposition: A | Discharge status: Home / Self Care | Source: Ambulatory Visit | Attending: Neurological Surgery | Admitting: Neurological Surgery

## 2024-04-22 ENCOUNTER — Encounter (HOSPITAL_COMMUNITY): Payer: Self-pay

## 2024-04-22 VITALS — BP 129/88 | HR 97 | Temp 98.1°F | Resp 16 | Ht 65.0 in | Wt 160.7 lb

## 2024-04-22 DIAGNOSIS — Z01818 Encounter for other preprocedural examination: Secondary | ICD-10-CM | POA: Diagnosis not present

## 2024-04-22 DIAGNOSIS — R935 Abnormal findings on diagnostic imaging of other abdominal regions, including retroperitoneum: Secondary | ICD-10-CM | POA: Diagnosis not present

## 2024-04-22 DIAGNOSIS — D7389 Other diseases of spleen: Secondary | ICD-10-CM | POA: Insufficient documentation

## 2024-04-22 DIAGNOSIS — Z01812 Encounter for preprocedural laboratory examination: Secondary | ICD-10-CM | POA: Diagnosis present

## 2024-04-22 LAB — COMPREHENSIVE METABOLIC PANEL WITH GFR
ALT: 22 U/L (ref 0–44)
AST: 30 U/L (ref 15–41)
Albumin: 4.6 g/dL (ref 3.5–5.0)
Alkaline Phosphatase: 76 U/L (ref 38–126)
Anion gap: 15 (ref 5–15)
BUN: 17 mg/dL (ref 8–23)
CO2: 26 mmol/L (ref 22–32)
Calcium: 9.9 mg/dL (ref 8.9–10.3)
Chloride: 99 mmol/L (ref 98–111)
Creatinine, Ser: 0.77 mg/dL (ref 0.44–1.00)
GFR, Estimated: >60 mL/min
Glucose, Bld: 101 mg/dL — ABNORMAL HIGH (ref 70–99)
Potassium: 3.3 mmol/L — ABNORMAL LOW (ref 3.5–5.1)
Sodium: 140 mmol/L (ref 135–145)
Total Bilirubin: 0.9 mg/dL (ref 0.0–1.2)
Total Protein: 7.3 g/dL (ref 6.5–8.1)

## 2024-04-22 LAB — CBC
HCT: 43.6 % (ref 36.0–46.0)
Hemoglobin: 14.8 g/dL (ref 12.0–15.0)
MCH: 30.8 pg (ref 26.0–34.0)
MCHC: 33.9 g/dL (ref 30.0–36.0)
MCV: 90.6 fL (ref 80.0–100.0)
Platelets: 218 K/uL (ref 150–400)
RBC: 4.81 MIL/uL (ref 3.87–5.11)
RDW: 11.9 % (ref 11.5–15.5)
WBC: 7.4 K/uL (ref 4.0–10.5)
nRBC: 0 % (ref 0.0–0.2)

## 2024-04-22 LAB — GLUCOSE, CAPILLARY: Glucose-Capillary: 119 mg/dL — ABNORMAL HIGH (ref 70–99)

## 2024-04-22 LAB — SURGICAL PCR SCREEN
MRSA, PCR: NEGATIVE
Staphylococcus aureus: NEGATIVE

## 2024-04-22 MED ORDER — FLUDEOXYGLUCOSE F - 18 (FDG) INJECTION
7.5000 | Freq: Once | INTRAVENOUS | Status: AC
  Administered 2024-04-22: 7.9 via INTRAVENOUS

## 2024-04-22 NOTE — Progress Notes (Signed)
 PCP - Berneta Elsie Sayre, MD   Cardiologist -   PPM/ICD -  Device Orders -  Rep Notified -   Chest x-ray - denies EKG - 03-28-24 Stress Test - denies ECHO - denies Cardiac Cath - denies  Sleep Study - denies CPAP - n/a  Dm -denies  Blood Thinner Instructions:denies Aspirin  Instructions: LAST Dose 04-20-24  ERAS Protcol -NPO  COVID TEST- n/a   Anesthesia review: No  Patient denies shortness of breath, fever, cough and chest pain at PAT appointment   All instructions explained to the patient, with a verbal understanding of the material. Patient agrees to go over the instructions while at home for a better understanding. Patient also instructed to self quarantine after being tested for COVID-19. The opportunity to ask questions was provided.

## 2024-04-27 ENCOUNTER — Ambulatory Visit (HOSPITAL_COMMUNITY)
Admission: RE | Disposition: A | Discharge status: Home / Self Care | Payer: Self-pay | Source: Home / Self Care | Attending: Neurological Surgery

## 2024-04-27 ENCOUNTER — Ambulatory Visit (HOSPITAL_COMMUNITY)

## 2024-04-27 ENCOUNTER — Ambulatory Visit (HOSPITAL_COMMUNITY)
Admission: RE | Admit: 2024-04-27 | Discharge: 2024-04-27 | Disposition: A | Discharge status: Home / Self Care | Attending: Neurological Surgery | Admitting: Neurological Surgery

## 2024-04-27 ENCOUNTER — Ambulatory Visit (HOSPITAL_COMMUNITY): Admitting: Anesthesiology

## 2024-04-27 ENCOUNTER — Encounter (HOSPITAL_COMMUNITY): Payer: Self-pay | Admitting: Neurological Surgery

## 2024-04-27 DIAGNOSIS — M48062 Spinal stenosis, lumbar region with neurogenic claudication: Secondary | ICD-10-CM | POA: Diagnosis not present

## 2024-04-27 DIAGNOSIS — K219 Gastro-esophageal reflux disease without esophagitis: Secondary | ICD-10-CM | POA: Diagnosis not present

## 2024-04-27 DIAGNOSIS — I10 Essential (primary) hypertension: Secondary | ICD-10-CM | POA: Diagnosis not present

## 2024-04-27 DIAGNOSIS — M1990 Unspecified osteoarthritis, unspecified site: Secondary | ICD-10-CM | POA: Diagnosis not present

## 2024-04-27 DIAGNOSIS — Z87891 Personal history of nicotine dependence: Secondary | ICD-10-CM | POA: Insufficient documentation

## 2024-04-27 DIAGNOSIS — B1920 Unspecified viral hepatitis C without hepatic coma: Secondary | ICD-10-CM | POA: Insufficient documentation

## 2024-04-27 DIAGNOSIS — Z981 Arthrodesis status: Secondary | ICD-10-CM | POA: Insufficient documentation

## 2024-04-27 HISTORY — PX: LUMBAR LAMINECTOMY/DECOMPRESSION MICRODISCECTOMY: SHX5026

## 2024-04-27 MED ORDER — DEXAMETHASONE SOD PHOSPHATE PF 10 MG/ML IJ SOLN
INTRAMUSCULAR | Status: DC | PRN
  Administered 2024-04-27: 10 mg via INTRAVENOUS

## 2024-04-27 MED ORDER — LIDOCAINE 2% (20 MG/ML) 5 ML SYRINGE
INTRAMUSCULAR | Status: DC | PRN
  Administered 2024-04-27: 60 mg via INTRAVENOUS

## 2024-04-27 MED ORDER — HYDROMORPHONE HCL 1 MG/ML IJ SOLN
INTRAMUSCULAR | Status: AC
  Filled 2024-04-27: qty 0.5

## 2024-04-27 MED ORDER — LACTATED RINGERS IV SOLN: INTRAVENOUS | Status: DC | PRN

## 2024-04-27 MED ORDER — HYDROMORPHONE HCL 1 MG/ML IJ SOLN
INTRAMUSCULAR | Status: DC | PRN
  Administered 2024-04-27: .5 mg via INTRAVENOUS

## 2024-04-27 MED ORDER — THROMBIN 5000 UNITS EX KIT
PACK | CUTANEOUS | Status: AC
  Filled 2024-04-27: qty 2

## 2024-04-27 MED ORDER — BUPIVACAINE HCL (PF) 0.25 % IJ SOLN
INTRAMUSCULAR | Status: DC | PRN
  Administered 2024-04-27: 10 mL
  Administered 2024-04-27: 6 mL

## 2024-04-27 MED ORDER — SUGAMMADEX SODIUM 200 MG/2ML IV SOLN
INTRAVENOUS | Status: DC | PRN
  Administered 2024-04-27: 200 mg via INTRAVENOUS

## 2024-04-27 MED ORDER — ONDANSETRON HCL 4 MG/2ML IJ SOLN: 4.0000 mg | Freq: Once | INTRAMUSCULAR | Status: DC | PRN

## 2024-04-27 MED ORDER — PROPOFOL 10 MG/ML IV BOLUS
INTRAVENOUS | Status: AC
  Filled 2024-04-27: qty 20

## 2024-04-27 MED ORDER — PHENYLEPHRINE HCL-NACL 20-0.9 MG/250ML-% IV SOLN
INTRAVENOUS | Status: DC | PRN
  Administered 2024-04-27: 30 ug/min via INTRAVENOUS

## 2024-04-27 MED ORDER — AMISULPRIDE (ANTIEMETIC) 5 MG/2ML IV SOLN: 10.0000 mg | Freq: Once | INTRAVENOUS | Status: DC | PRN

## 2024-04-27 MED ORDER — THROMBIN 5000 UNITS EX SOLR
OROMUCOSAL | Status: DC | PRN
  Administered 2024-04-27: 5 mL via TOPICAL

## 2024-04-27 MED ORDER — OXYCODONE HCL 5 MG/5ML PO SOLN: 5.0000 mg | Freq: Once | ORAL | Status: AC | PRN

## 2024-04-27 MED ORDER — FENTANYL CITRATE (PF) 100 MCG/2ML IJ SOLN
INTRAMUSCULAR | Status: AC
  Filled 2024-04-27: qty 2

## 2024-04-27 MED ORDER — PROPOFOL 10 MG/ML IV BOLUS
INTRAVENOUS | Status: DC | PRN
  Administered 2024-04-27: 100 mg via INTRAVENOUS

## 2024-04-27 MED ORDER — ORAL CARE MOUTH RINSE: 15.0000 mL | Freq: Once | OROMUCOSAL | Status: AC

## 2024-04-27 MED ORDER — LACTATED RINGERS IV SOLN: INTRAVENOUS | Status: DC

## 2024-04-27 MED ORDER — HYDROMORPHONE HCL 1 MG/ML IJ SOLN: 0.2500 mg | INTRAMUSCULAR | Status: DC | PRN

## 2024-04-27 MED ORDER — THROMBIN 5000 UNITS EX KIT
PACK | CUTANEOUS | Status: AC
  Filled 2024-04-27: qty 1

## 2024-04-27 MED ORDER — OXYCODONE HCL 5 MG PO TABS
5.0000 mg | ORAL_TABLET | Freq: Once | ORAL | Status: AC | PRN
  Administered 2024-04-27: 5 mg via ORAL

## 2024-04-27 MED ORDER — BUPIVACAINE HCL (PF) 0.25 % IJ SOLN
INTRAMUSCULAR | Status: AC
  Filled 2024-04-27: qty 30

## 2024-04-27 MED ORDER — CHLORHEXIDINE GLUCONATE 0.12 % MT SOLN
15.0000 mL | Freq: Once | OROMUCOSAL | Status: AC
  Administered 2024-04-27: 15 mL via OROMUCOSAL
  Filled 2024-04-27: qty 15

## 2024-04-27 MED ORDER — ACETAMINOPHEN 500 MG PO TABS
1000.0000 mg | ORAL_TABLET | ORAL | Status: DC
  Filled 2024-04-27: qty 2

## 2024-04-27 MED ORDER — DEXMEDETOMIDINE HCL IN NACL 80 MCG/20ML IV SOLN
INTRAVENOUS | Status: DC | PRN
  Administered 2024-04-27: 8 ug via INTRAVENOUS

## 2024-04-27 MED ORDER — GABAPENTIN 300 MG PO CAPS
300.0000 mg | ORAL_CAPSULE | ORAL | Status: DC
  Filled 2024-04-27: qty 1

## 2024-04-27 MED ORDER — FENTANYL CITRATE (PF) 250 MCG/5ML IJ SOLN
INTRAMUSCULAR | Status: DC | PRN
  Administered 2024-04-27 (×2): 50 ug via INTRAVENOUS

## 2024-04-27 MED ORDER — OXYCODONE HCL 5 MG PO TABS
ORAL_TABLET | ORAL | Status: AC
  Filled 2024-04-27: qty 1

## 2024-04-27 MED ORDER — PHENYLEPHRINE 80 MCG/ML (10ML) SYRINGE FOR IV PUSH (FOR BLOOD PRESSURE SUPPORT)
PREFILLED_SYRINGE | INTRAVENOUS | Status: DC | PRN
  Administered 2024-04-27: 80 ug via INTRAVENOUS

## 2024-04-27 MED ORDER — ONDANSETRON HCL 4 MG/2ML IJ SOLN
INTRAMUSCULAR | Status: DC | PRN
  Administered 2024-04-27: 4 mg via INTRAVENOUS

## 2024-04-27 MED ORDER — 0.9 % SODIUM CHLORIDE (POUR BTL) OPTIME
TOPICAL | Status: DC | PRN
  Administered 2024-04-27: 1000 mL

## 2024-04-27 MED ORDER — ROCURONIUM BROMIDE 10 MG/ML (PF) SYRINGE
PREFILLED_SYRINGE | INTRAVENOUS | Status: DC | PRN
  Administered 2024-04-27: 50 mg via INTRAVENOUS

## 2024-04-27 MED ORDER — THROMBIN (RECOMBINANT) 5000 UNITS EX SOLR
CUTANEOUS | Status: DC | PRN
  Administered 2024-04-27: 10 mL via TOPICAL

## 2024-04-27 MED ORDER — CEFAZOLIN SODIUM-DEXTROSE 2-4 GM/100ML-% IV SOLN
2.0000 g | INTRAVENOUS | Status: AC
  Administered 2024-04-27: 2 g via INTRAVENOUS
  Filled 2024-04-27: qty 100

## 2024-04-27 MED ORDER — CHLORHEXIDINE GLUCONATE CLOTH 2 % EX PADS: 6.0000 | MEDICATED_PAD | Freq: Once | CUTANEOUS | Status: DC

## 2024-04-27 NOTE — Transfer of Care (Signed)
 Immediate Anesthesia Transfer of Care Note  Patient: Michele Hanson  Procedure(s) Performed: LUMBAR LAMINECTOMY AND FORAMINOTOMY LUMBAR THREE-FOUR WITH RIGHT SUBLAMINAR DECOMPRESSION (Right: Back)  Patient Location: PACU  Anesthesia Type:General  Level of Consciousness: awake, alert , and oriented  Airway & Oxygen Therapy: Patient Spontanous Breathing and Patient connected to nasal cannula oxygen  Post-op Assessment: Report given to RN and Post -op Vital signs reviewed and stable  Post vital signs: Reviewed and stable  Last Vitals:  Vitals Value Taken Time  BP 133/84 04/27/24 10:00  Temp 36 1000  Pulse 89 04/27/24 10:00  Resp 13 04/27/24 10:00  SpO2 94 % 04/27/24 10:00  Vitals shown include unfiled device data.  Last Pain:  Vitals:   04/27/24 0723  TempSrc:   PainSc: 3       Patients Stated Pain Goal: 1 (04/27/24 0723)  Complications: No notable events documented.

## 2024-04-27 NOTE — H&P (Signed)
 Subjective: Patient is a 74 y.o. female admitted for lumbar stenosis with claudication. Onset of symptoms was several months ago, gradually worsening since that time.  The pain is rated severe, and is located at the across the lower back and radiates to legs R > L. The pain is described as aching and occurs all day. The symptoms have been progressive. Symptoms are exacerbated by exercise, standing, and walking for more than a few minutes. MRI or CT showed stenosis L3-4   Past Medical History:  Diagnosis Date   Allergy Seasonal   Anxiety    Arthritis    B12 deficiency 07/16/2017   Benign paroxysmal positional vertigo of right ear 10/29/2020   Last Assessment & Plan:    Concern over intermittent vertigo.   1 month history of intermittent brief episodes of spinning vertigo with sudden change in head position.  She relates it to starting after she hit her head about a month ago.  Denies any acute change in hearing.  No prior history of significant similar episodes.   EXAMINATION shows normal external canals and tympanic membranes.  Hal   Bilateral carpal tunnel syndrome 03/17/2019   Blood transfusion without reported diagnosis 1973   Depression    Fatty liver disease, nonalcoholic    GERD (gastroesophageal reflux disease)    Headache    Hepatitis 1973   hep c-was treated   Hyperlipemia    Hypertension    Neuromuscular disorder (HCC) 2018   Neuropathy feet   Seasonal allergic rhinitis due to pollen 03/16/2018    Past Surgical History:  Procedure Laterality Date   BACK SURGERY     CARPAL TUNNEL RELEASE Bilateral    CHOLECYSTECTOMY     DIAGNOSTIC LAPAROSCOPY     FEMORAL HERNIA REPAIR Left 03/06/2023   Procedure: ROBOTIC REPAIR LEFT INGUINAL HERNIA WITH MESH;  Surgeon: Tanda Locus, MD;  Location: [facility];  Service: General;  Laterality: Left;   INCONTINENCE SURGERY     JOINT REPLACEMENT  03/2021   Total right reverse shoulder replacement   LAPAROSCOPIC LYSIS OF ADHESIONS     REVERSE  SHOULDER ARTHROPLASTY Right 03/20/2021   Procedure: REVERSE SHOULDER ARTHROPLASTY;  Surgeon: Cristy Bonner DASEN, MD;  Location: [facility];  Service: Orthopedics;  Laterality: Right;   SPINE SURGERY  2020   Lumbar fusion    Prior to Admission medications  Medication Sig Start Date End Date Taking? Authorizing Provider  acetaminophen  (TYLENOL ) 650 MG CR tablet Take 650-1,300 mg by mouth 2 (two) times daily as needed for pain.   Yes [provider]  atorvastatin  (LIPITOR) 20 MG tablet TAKE 1 TABLET BY MOUTH EVERY DAY 12/18/23  Yes Berneta Elsie Sayre, MD  Azelastine  HCl 137 MCG/SPRAY SOLN Place 1 spray into both nostrils 2 (two) times daily as needed (for seasonal allergies).   Yes [provider]  b complex vitamins capsule Take 1 capsule by mouth 4 (four) times a week.   Yes [provider]  Calcium -Magnesium-Zinc (CAL-MAG-ZINC PO) Take 1 tablet by mouth in the morning.   Yes [provider]  citalopram  (CELEXA ) 20 MG tablet TAKE 1 TABLET BY MOUTH 1 TIME DAILY 03/16/24  Yes Berneta Elsie Sayre, MD  cyclobenzaprine (FLEXERIL) 5 MG tablet Take 5 mg by mouth 3 (three) times daily as needed (leg/back spasms).   Yes [provider]  EX-LAX 15 MG CHEW Chew 15 mg by mouth daily as needed (for constipation).   Yes [provider]  gabapentin  (NEURONTIN ) 400 MG capsule  TAKE 1 CAPSULE BY MOUTH THREE TIMES A DAY Patient taking differently: Take 400 mg by mouth 3 (three) times daily as needed (foot neuropathy). 10/27/23  Yes Berneta Elsie Sayre, MD  hydrochlorothiazide  (HYDRODIURIL ) 25 MG tablet TAKE 1 TABLET (25 MG TOTAL) BY MOUTH DAILY. 12/18/23  Yes Berneta Elsie Sayre, MD  Hydrocortisone (CORTIZONE-10 EX) Apply 1 spray topically as needed (itchy skin).   Yes [provider]  LUMIFY PF 0.025 % SOLN Place 1 drop into both eyes in the morning.   Yes [provider]  Milk Thistle 250 MG CAPS Take 250 mg by mouth daily.    Yes [provider]  Multiple Vitamins-Minerals (ADULT ONE DAILY GUMMIES PO) Take 2 each by mouth in the morning.   Yes [provider]  omeprazole  (PRILOSEC) 20 MG capsule TAKE 1 CAPSULE BY MOUTH 1 TIME DAILY Patient taking differently: Take 20 mg by mouth daily as needed (indigestion/heartburn.). 09/18/23  Yes Berneta Elsie Sayre, MD  aspirin  325 MG tablet Take 1 tablet (325 mg total) by mouth daily as needed for mild pain (pain score 1-3) (for pain). 03/30/24   Barbarann Nest, MD  Calcium  Carbonate-Simethicone (ALKA-SELTZER HEARTBURN + GAS PO) Take 2 tablets by mouth daily as needed (for heartburn- dissolve as directed).    [provider]  denosumab  (PROLIA ) 60 MG/ML SOSY injection Inject 60 mg into the skin every 6 (six) months.    [provider]  fluticasone  (FLONASE ) 50 MCG/ACT nasal spray USE 1 SPRAY IN EACH NOSTRIL 1 TIME DAILY Patient taking differently: Place 1 spray into both nostrils daily as needed (seasonal allergies.). 04/27/23   Berneta Elsie Sayre, MD  loperamide (IMODIUM A-D) 2 MG tablet Take 2 mg by mouth 4 (four) times daily as needed for diarrhea or loose stools.    [provider]   Allergies[1]  Social History   Tobacco Use   Smoking status: Former    Types: Cigarettes   Smokeless tobacco: Never  Substance Use Topics   Alcohol use: Yes    Comment: several times per week    Family History  Problem Relation Age of Onset   Asthma Mother    Cancer Mother    Depression Mother    Hearing loss Mother    Cancer Father    Vision loss Father      Review of Systems  Positive ROS: neg  All other systems have been reviewed and were otherwise negative with the exception of those mentioned in the HPI and as above.  Objective: Vital signs in last 24 hours: Temp:  [98.5 F (36.9 C)] 98.5 F (36.9 C) (01/14 0646) Pulse Rate:  [98] 98 (01/14 0646) Resp:  [18] 18 (01/14 0646) BP: (144)/(90) 144/90 (01/14 0646) SpO2:   [93 %] 93 % (01/14 0646) Weight:  [72.9 kg] 72.9 kg (01/14 0646)  General Appearance: Alert, cooperative, no distress, appears stated age Head: Normocephalic, without obvious abnormality, atraumatic Eyes: PERRL, conjunctiva/corneas clear, EOM's intact    Neck: Supple, symmetrical, trachea midline Back: Symmetric, no curvature, ROM normal, no CVA tenderness Lungs:  respirations unlabored Heart: Regular rate and rhythm Abdomen: Soft, non-tender Extremities: Extremities normal, atraumatic, no cyanosis or edema Pulses: 2+ and symmetric all extremities Skin: Skin color, texture, turgor normal, no rashes or lesions  NEUROLOGIC:   Mental status: Alert and oriented x4,  no aphasia, good attention span, fund of knowledge, and memory Motor Exam - grossly normal Sensory Exam - grossly normal Reflexes: 1+ Coordination - grossly normal Gait -  grossly normal Balance - grossly normal Cranial Nerves: I: smell Not tested  II: visual acuity  OS: nl    OD: nl  II: visual fields Full to confrontation  II: pupils Equal, round, reactive to light  III,VII: ptosis None  III,IV,VI: extraocular muscles  Full ROM  V: mastication Normal  V: facial light touch sensation  Normal  V,VII: corneal reflex  Present  VII: facial muscle function - upper  Normal  VII: facial muscle function - lower Normal  VIII: hearing Not tested  IX: soft palate elevation  Normal  IX,X: gag reflex Present  XI: trapezius strength  5/5  XI: sternocleidomastoid strength 5/5  XI: neck flexion strength  5/5  XII: tongue strength  Normal    Data Review Lab Results  Component Value Date   WBC 7.4 04/22/2024   HGB 14.8 04/22/2024   HCT 43.6 04/22/2024   MCV 90.6 04/22/2024   PLT 218 04/22/2024   Lab Results  Component Value Date   NA 140 04/22/2024   K 3.3 (L) 04/22/2024   CL 99 04/22/2024   CO2 26 04/22/2024   BUN 17 04/22/2024   CREATININE 0.77 04/22/2024   GLUCOSE 101 (H) 04/22/2024   No results found for:  INR, PROTIME  Assessment/Plan:  Estimated body mass index is 26.74 kg/m as calculated from the following:   Height as of this encounter: 5' 5 (1.651 m).   Weight as of this encounter: 72.9 kg. Patient admitted for L3-4 laminectomy. Patient has failed a reasonable attempt at conservative therapy.  I explained the condition and procedure to the patient and answered any questions.  Patient wishes to proceed with procedure as planned. Understands risks/ benefits and typical outcomes of procedure.   Alm GORMAN Molt 04/27/2024 7:57 AM      [1]  Allergies Allergen Reactions   Ciprofloxacin Other (See Comments)    CAUSED PERMANENT NEUROPATHY IN BOTH FEET!!!   Neomycin Itching and Other (See Comments)    Eye drops version   Nickel Itching and Rash

## 2024-04-27 NOTE — Anesthesia Procedure Notes (Signed)
 Procedure Name: Intubation Date/Time: 04/27/2024 8:37 AM  Performed by: Scherrie Mast, CRNAPre-anesthesia Checklist: Patient identified, Emergency Drugs available, Suction available and Patient being monitored Patient Re-evaluated:Patient Re-evaluated prior to induction Oxygen Delivery Method: Circle System Utilized Preoxygenation: Pre-oxygenation with 100% oxygen Induction Type: IV induction Ventilation: Mask ventilation without difficulty Laryngoscope Size: Mac and 4 Grade View: Grade I Tube type: Oral Tube size: 7.0 mm Number of attempts: 1 Airway Equipment and Method: Stylet and Oral airway Placement Confirmation: ETT inserted through vocal cords under direct vision, positive ETCO2 and breath sounds checked- equal and bilateral Secured at: 21 cm Tube secured with: Tape Dental Injury: Teeth and Oropharynx as per pre-operative assessment

## 2024-04-27 NOTE — Discharge Summary (Signed)
 Physician Discharge Summary  Patient ID: Michele Hanson MRN: [id number] DOB/AGE: 01-01-1951 74 y.o.  Admit date: 04/27/2024 Discharge date: 04/27/2024  Admission Diagnoses: Lumbar spinal stenosis    Discharge Diagnoses: Same   Discharged Condition: Good  Hospital Course: The patient was admitted on 04/27/2024 and taken to the operating room where the patient underwent lumbar laminectomy L3-4. The patient tolerated the procedure well and was taken to the recovery room and then to the PACU in stable condition. The hospital course was routine. There were no complications. The wound remained clean dry and intact. Pt had appropriate back soreness. No complaints of leg pain or new N/T/W. The patient remained afebrile with stable vital signs, and tolerated a regular diet. The patient continued to increase activities, and pain was well controlled with oral pain medications.   Consults: None  Significant Diagnostic Studies:  Results for orders placed or performed during the hospital encounter of 04/22/24  Surgical pcr screen   Collection Time: 04/22/24  3:09 PM   Specimen: Nasal Mucosa; Nasal Swab  Result Value Ref Range   MRSA, PCR NEGATIVE NEGATIVE   Staphylococcus aureus NEGATIVE NEGATIVE  Comprehensive metabolic panel per protocol   Collection Time: 04/22/24  3:30 PM  Result Value Ref Range   Sodium 140 135 - 145 mmol/L   Potassium 3.3 (L) 3.5 - 5.1 mmol/L   Chloride 99 98 - 111 mmol/L   CO2 26 22 - 32 mmol/L   Glucose, Bld 101 (H) 70 - 99 mg/dL   BUN 17 8 - 23 mg/dL   Creatinine, Ser 9.22 0.44 - 1.00 mg/dL   Calcium  9.9 8.9 - 10.3 mg/dL   Total Protein 7.3 6.5 - 8.1 g/dL   Albumin 4.6 3.5 - 5.0 g/dL   AST 30 15 - 41 U/L   ALT 22 0 - 44 U/L   Alkaline Phosphatase 76 38 - 126 U/L   Total Bilirubin 0.9 0.0 - 1.2 mg/dL   GFR, Estimated >39 >39 mL/min   Anion gap 15 5 - 15  CBC per protocol   Collection Time: 04/22/24  3:30 PM  Result Value Ref Range   WBC 7.4 4.0 - 10.5 K/uL    RBC 4.81 3.87 - 5.11 MIL/uL   Hemoglobin 14.8 12.0 - 15.0 g/dL   HCT 56.3 63.9 - 53.9 %   MCV 90.6 80.0 - 100.0 fL   MCH 30.8 26.0 - 34.0 pg   MCHC 33.9 30.0 - 36.0 g/dL   RDW 88.0 88.4 - 84.4 %   Platelets 218 150 - 400 K/uL   nRBC 0.0 0.0 - 0.2 %    CT CHEST ABDOMEN PELVIS W CONTRAST Result Date: 03/28/2024 EXAM: CT CHEST, ABDOMEN AND PELVIS WITH CONTRAST 03/28/2024 04:42:00 PM TECHNIQUE: CT of the chest, abdomen and pelvis was performed with the administration of 92 mL of iohexol  (OMNIPAQUE ) 300 MG/ML solution. Multiplanar reformatted images are provided for review. Automated exposure control, iterative reconstruction, and/or weight based adjustment of the mA/kV was utilized to reduce the radiation dose to as low as reasonably achievable. COMPARISON: Ultrasound abdomen 02/03/2019 and ultrasound pelvis 10/21/2022. CLINICAL HISTORY: Polytrauma, blunt; right sided. [name] out of bed last night with left flank pain. FINDINGS: CHEST: MEDIASTINUM AND LYMPH NODES: Heart and pericardium are unremarkable. The central airways are clear. No mediastinal, hilar or axillary lymphadenopathy. LUNGS AND PLEURA: No focal consolidation or pulmonary edema. No pleural effusion or pneumothorax. ABDOMEN AND PELVIS: LIVER: Mild diffuse fatty infiltration of the liver. No focal liver lesions. GALLBLADDER AND  BILE DUCTS: Gallbladder is unremarkable. No biliary ductal dilatation. SPLEEN: Multiple low attenuation lesions are demonstrated throughout the spleen, the largest measuring 1.6 cm in diameter. These may represent hemangiomas, although due to the finding of multiple lesions, lymphoma or metastatic disease could also have this appearance. PANCREAS: No acute abnormality. ADRENAL GLANDS: No acute abnormality. KIDNEYS, URETERS AND BLADDER: No stones in the kidneys or ureters. No hydronephrosis. No perinephric or periureteral stranding. Urinary bladder is unremarkable. GI AND BOWEL: Stomach demonstrates no acute abnormality.  Diverticulosis of the sigmoid colon. No evidence of acute diverticulitis. There is no bowel obstruction. REPRODUCTIVE ORGANS: No acute abnormality. PERITONEUM AND RETROPERITONEUM: No ascites. No free air. VASCULATURE: Aorta is normal in caliber. Calcification of the aorta. No aneurysm. ABDOMINAL AND PELVIS LYMPH NODES: No lymphadenopathy. BONES AND SOFT TISSUES: Acute fractures of the right lateral 11th, 10th, 9th, and 8th ribs with additional old fractures of bilateral ribs. No focal bone lesions. Postoperative changes in the right shoulder. Postoperative fixation of the lower lumbar spine. Degenerative changes in the spine. No acute vertebral compression deformities. The sternum is nondepressed. Pelvis and hips appear intact. Abdominal wall musculature appears intact. IMPRESSION: 1. Acute fractures of the right lateral 11th, 10th, 9th, and 8th ribs, with additional old bilateral rib fractures. 2. Multiple low-attenuation splenic lesions up to 1.6 cm, which could represent hemangiomas; lymphoma or metastatic disease is also possible. Consider short-term follow-up versus PET CT for further evaluation if clinically indicated. Electronically signed by: Elsie Gravely MD 03/28/2024 05:35 PM EST RP Workstation: [id number]   CT Cervical Spine Wo Contrast Result Date: 03/28/2024 EXAM: CT CERVICAL SPINE WITHOUT CONTRAST 03/28/2024 04:40:00 PM TECHNIQUE: CT of the cervical spine was performed without the administration of intravenous contrast. Multiplanar reformatted images are provided for review. Automated exposure control, iterative reconstruction, and/or weight based adjustment of the mA/kV was utilized to reduce the radiation dose to as low as reasonably achievable. COMPARISON: None available. CLINICAL HISTORY: Neck trauma (Age >= 65y) FINDINGS: BONES AND ALIGNMENT: Straightening of the normal cervical lordosis. There is 2 mm degenerative anterolisthesis of C3 on C4 and C4 on C5. Additional trace degenerative  retrolisthesis of C5 on C6 and trace degenerative anterolisthesis of C6 on C7 and C7 on T1. There are no findings to suggest traumatic malalignment. No acute fracture. DEGENERATIVE CHANGES: No high grade osseous spinal canal stenosis. Disc space narrowing at C5-C6 with associated endplate sclerosis and degenerative endplate osteophytes. Facet arthrosis most pronounced on the left from C3-C4 through C6-C7. Uncovertebral hypertrophy greatest at C5-C6. There is foraminal stenosis at multiple levels most pronounced at C5-C6. SOFT TISSUES: No prevertebral soft tissue swelling. IMPRESSION: 1. No evidence of acute traumatic injury. 2. Multilevel degenerative changes as above. Electronically signed by: Donnice Mania MD 03/28/2024 05:19 PM EST RP Workstation: [id number]   CT Head Wo Contrast Result Date: 03/28/2024 EXAM: CT HEAD WITHOUT 03/28/2024 04:40:00 PM TECHNIQUE: CT of the head was performed without the administration of intravenous contrast. Automated exposure control, iterative reconstruction, and/or weight based adjustment of the mA/kV was utilized to reduce the radiation dose to as low as reasonably achievable. COMPARISON: None available. CLINICAL HISTORY: Head trauma, minor (Age >= 65y) FINDINGS: BRAIN AND VENTRICLES: No acute intracranial hemorrhage. No mass effect or midline shift. No extra-axial fluid collection. No evidence of acute infarct. No hydrocephalus. Mild cerebral volume loss. Mild chronic microvascular ischemic changes. Mild calcific atheromatous disease in carotid siphons. ORBITS: Left periorbital soft tissue contusion. SINUSES AND MASTOIDS: No acute abnormality. SOFT TISSUES AND SKULL: No  acute skull fracture. IMPRESSION: 1. No acute intracranial abnormality. 2. Left periorbital soft tissue contusion. Electronically signed by: Donnice Mania MD 03/28/2024 05:13 PM EST RP Workstation: [id number]    Antibiotics:  Anti-infectives (From admission, onward)    Start     Dose/Rate Route Frequency  Ordered Stop   04/27/24 0700  ceFAZolin  (ANCEF ) IVPB 2g/100 mL premix        2 g 200 mL/hr over 30 Minutes Intravenous On call to O.R. 04/27/24 0654 04/27/24 0908       Discharge Exam: Blood pressure (!) 144/90, pulse 98, temperature 98.5 F (36.9 C), temperature source Oral, resp. rate 18, height 5' 5 (1.651 m), weight 72.9 kg, SpO2 93%. Neurologic: Grossly normal Dressing clean dry and intact  Discharge Medications:   Allergies as of 04/27/2024       Reactions   Ciprofloxacin Other (See Comments)   CAUSED PERMANENT NEUROPATHY IN BOTH FEET!!!   Neomycin Itching, Other (See Comments)   Eye drops version   Nickel Itching, Rash        Medication List     TAKE these medications    acetaminophen  650 MG CR tablet Commonly known as: TYLENOL  Take 650-1,300 mg by mouth 2 (two) times daily as needed for pain.   ADULT ONE DAILY GUMMIES PO Take 2 each by mouth in the morning.   ALKA-SELTZER HEARTBURN + GAS PO Take 2 tablets by mouth daily as needed (for heartburn- dissolve as directed).   aspirin  325 MG tablet Take 1 tablet (325 mg total) by mouth daily as needed for mild pain (pain score 1-3) (for pain).   atorvastatin  20 MG tablet Commonly known as: LIPITOR TAKE 1 TABLET BY MOUTH EVERY DAY   Azelastine  HCl 137 MCG/SPRAY Soln Place 1 spray into both nostrils 2 (two) times daily as needed (for seasonal allergies).   b complex vitamins capsule Take 1 capsule by mouth 4 (four) times a week.   CAL-MAG-ZINC PO Take 1 tablet by mouth in the morning.   citalopram  20 MG tablet Commonly known as: CELEXA  TAKE 1 TABLET BY MOUTH 1 TIME DAILY   CORTIZONE-10 EX Apply 1 spray topically as needed (itchy skin).   cyclobenzaprine 5 MG tablet Commonly known as: FLEXERIL Take 5 mg by mouth 3 (three) times daily as needed (leg/back spasms).   denosumab  60 MG/ML Sosy injection Commonly known as: PROLIA  Inject 60 mg into the skin every 6 (six) months.   Ex-Lax 15 MG  Chew Generic drug: Sennosides Chew 15 mg by mouth daily as needed (for constipation).   fluticasone  50 MCG/ACT nasal spray Commonly known as: FLONASE  USE 1 SPRAY IN EACH NOSTRIL 1 TIME DAILY What changed: See the new instructions.   gabapentin  400 MG capsule Commonly known as: NEURONTIN  TAKE 1 CAPSULE BY MOUTH THREE TIMES A DAY What changed: See the new instructions.   hydrochlorothiazide  25 MG tablet Commonly known as: HYDRODIURIL  TAKE 1 TABLET (25 MG TOTAL) BY MOUTH DAILY.   loperamide 2 MG tablet Commonly known as: IMODIUM A-D Take 2 mg by mouth 4 (four) times daily as needed for diarrhea or loose stools.   Lumify PF 0.025 % Soln Generic drug: Brimonidine Tartrate (PF) Place 1 drop into both eyes in the morning.   Milk Thistle 250 MG Caps Take 250 mg by mouth daily.   omeprazole  20 MG capsule Commonly known as: PRILOSEC TAKE 1 CAPSULE BY MOUTH 1 TIME DAILY What changed: See the new instructions.        Disposition:  home   Final Dx: Right L3-4 decompressive laminectomy  Discharge Instructions      Remove dressing in 72 hours   Complete by: As directed    Call MD for:  difficulty breathing, headache or visual disturbances   Complete by: As directed    Call MD for:  persistant nausea and vomiting   Complete by: As directed    Call MD for:  redness, tenderness, or signs of infection (pain, swelling, redness, odor or green/yellow discharge around incision site)   Complete by: As directed    Call MD for:  severe uncontrolled pain   Complete by: As directed    Call MD for:  temperature >100.4   Complete by: As directed    Increase activity slowly   Complete by: As directed           Signed: Alm GORMAN Molt 04/27/2024, 9:54 AM

## 2024-04-27 NOTE — Anesthesia Postprocedure Evaluation (Signed)
"   Anesthesia Post Note  Patient: Michele Hanson  Procedure(s) Performed: LUMBAR LAMINECTOMY AND FORAMINOTOMY LUMBAR THREE-FOUR WITH RIGHT SUBLAMINAR DECOMPRESSION (Right: Back)     Patient location during evaluation: PACU Anesthesia Type: General Level of consciousness: awake and alert, oriented and patient cooperative Pain management: pain level controlled Vital Signs Assessment: post-procedure vital signs reviewed and stable Respiratory status: spontaneous breathing, nonlabored ventilation and respiratory function stable Cardiovascular status: blood pressure returned to baseline and stable Postop Assessment: no apparent nausea or vomiting Anesthetic complications: no   There were no known notable events for this encounter.  Last Vitals:  Vitals:   04/27/24 1100 04/27/24 1115  BP: 124/75 122/73  Pulse: 89 90  Resp: 13 13  Temp:  36.6 C  SpO2: (!) 89% 97%    Last Pain:  Vitals:   04/27/24 1115  TempSrc:   PainSc: 4                  Almarie HERO Weslee Fogg      "

## 2024-04-27 NOTE — Op Note (Signed)
 04/27/2024  9:51 AM  PATIENT:  Michele Hanson  74 y.o. female  PRE-OPERATIVE DIAGNOSIS: Lumbar spinal stenosis L3-4 with back pain and right more than left leg pain  POST-OPERATIVE DIAGNOSIS:  same  PROCEDURE: Decompressive lumbar hemilaminectomy medial facetectomy and foraminotomy L3-4 on the right with sublaminar decompression  SURGEON:  Alm Molt, MD  ASSISTANTS: Suzen Pean, FNP  ANESTHESIA:   General  EBL: less than 2 ml  Total I/O In: 100 [IV Piggyback:100] Out: -   BLOOD ADMINISTERED: none  DRAINS: none  SPECIMEN:  none  INDICATION FOR PROCEDURE: This patient presented with back pain with neurogenic claudication involving the right leg more than the left. Imaging showed previous fusion L5-S1 but stenosis at L3-4. The patient tried conservative measures without relief. Pain was debilitating. Recommended decompressive laminectomy L3-4. Patient understood the risks, benefits, and alternatives and potential outcomes and wished to proceed.  PROCEDURE DETAILS: The patient was taken to the operating room and after induction of adequate generalized endotracheal anesthesia, the patient was rolled into the prone position on the Wilson frame and all pressure points were padded. The lumbar region was cleaned and then prepped with DuraPrep and draped in the usual sterile fashion. 5 cc of local anesthesia was injected and then a dorsal midline incision was made and carried down to the lumbo sacral fascia. The fascia was opened and the paraspinous musculature was taken down in a subperiosteal fashion to expose L3-4 on the right. Intraoperative x-ray confirmed my level, and then I used a combination of the high-speed drill and the Kerrison punches to perform a hemilaminectomy, medial facetectomy, and foraminotomy at L3-4 on the right. The underlying yellow ligament was opened and removed in a piecemeal fashion to expose the underlying dura and exiting nerve root. I undercut the lateral  recess and dissected down until I was medial to and distal to the pedicle. The nerve root was well decompressed.  We then drilled up under the spinous process and opposite lamina and remove the yellow ligament from the left-hand side to decompress the central canal and left lateral recess.  I could palpate the pedicle with a coronary dilator and see the exiting L4 nerve root.  I then palpated with a coronary dilator along the nerve root and into the foramen to assure adequate decompression. I felt no more compression of the nerve root. I irrigated with saline solution. Achieved hemostasis with bipolar cautery, lined the dura with Gelfoam, and then closed the fascia with 0 Vicryl. I closed the subcutaneous tissues with 2-0 Vicryl and the subcuticular tissues with 3-0 Vicryl. The skin was then closed with benzoin and Steri-Strips. The drapes were removed, a sterile dressing was applied.  My nurse practitioner was involved in the exposure, safe retraction of the neural elements, the decompression and the closure. the patient was awakened from general anesthesia and transferred to the recovery room in stable condition. At the end of the procedure all sponge, needle and instrument counts were correct.    PLAN OF CARE: Discharge to home after PACU  PATIENT DISPOSITION:  PACU - hemodynamically stable.   Delay start of Pharmacological VTE agent (>24hrs) due to surgical blood loss or risk of bleeding:  yes

## 2024-04-27 NOTE — Anesthesia Preprocedure Evaluation (Addendum)
"                                    Anesthesia Evaluation  Patient identified by MRN, date of birth, ID band Patient awake    Reviewed: Allergy & Precautions, H&P , NPO status , Patient's Chart, lab work & pertinent test results  Airway Mallampati: III  TM Distance: >3 FB Neck ROM: Full    Dental  (+) Teeth Intact, Dental Advisory Given   Pulmonary former smoker   Pulmonary exam normal breath sounds clear to auscultation       Cardiovascular hypertension (144/90 preop), Pt. on medications Normal cardiovascular exam Rhythm:Regular Rate:Normal     Neuro/Psych  Headaches PSYCHIATRIC DISORDERS Anxiety Depression       GI/Hepatic ,GERD  Medicated and Controlled,,(+) Hepatitis - (HCV treated), C  Endo/Other  negative endocrine ROS    Renal/GU negative Renal ROS  negative genitourinary   Musculoskeletal  (+) Arthritis , Osteoarthritis,    Abdominal   Peds negative pediatric ROS (+)  Hematology negative hematology ROS (+)   Anesthesia Other Findings   Reproductive/Obstetrics negative OB ROS                              Anesthesia Physical Anesthesia Plan  ASA: 2  Anesthesia Plan: General   Post-op Pain Management: Tylenol  PO (pre-op)*, Ketamine IV* and Dilaudid  IV   Induction: Intravenous  PONV Risk Score and Plan: 3 and Ondansetron , Dexamethasone , Midazolam  and Treatment may vary due to age or medical condition  Airway Management Planned: Oral ETT  Additional Equipment: None  Intra-op Plan:   Post-operative Plan: Extubation in OR  Informed Consent: I have reviewed the patients History and Physical, chart, labs and discussed the procedure including the risks, benefits and alternatives for the proposed anesthesia with the patient or authorized representative who has indicated his/her understanding and acceptance.     Dental advisory given  Plan Discussed with: CRNA  Anesthesia Plan Comments: (Last airway  note: Ventilation: Mask ventilation without difficulty Laryngoscope Size: Mac and 3 Grade View: Grade I Tube type: Oral Tube size: 7.0 mm Number of attempts: 1 )         Anesthesia Quick Evaluation  "

## 2024-04-28 ENCOUNTER — Encounter (HOSPITAL_COMMUNITY): Payer: Self-pay | Admitting: Neurological Surgery

## 2024-04-28 MED FILL — Thrombin For Soln 5000 Unit: CUTANEOUS | Qty: 2 | Status: AC

## 2024-05-02 ENCOUNTER — Ambulatory Visit: Payer: Self-pay | Admitting: Family Medicine

## 2024-06-28 ENCOUNTER — Encounter: Admitting: Family Medicine

## 2024-07-04 ENCOUNTER — Other Ambulatory Visit

## 2024-08-15 ENCOUNTER — Ambulatory Visit
